# Patient Record
Sex: Male | Born: 1965 | Race: White | Hispanic: No | Smoking: Former smoker
Health system: Southern US, Community
[De-identification: ages and names within clinical notes are randomized; demographics above are authoritative.]

## PROBLEM LIST (undated history)

## (undated) DIAGNOSIS — G629 Polyneuropathy, unspecified: Secondary | ICD-10-CM

## (undated) DIAGNOSIS — E785 Hyperlipidemia, unspecified: Secondary | ICD-10-CM

## (undated) DIAGNOSIS — I872 Venous insufficiency (chronic) (peripheral): Secondary | ICD-10-CM

## (undated) DIAGNOSIS — E119 Type 2 diabetes mellitus without complications: Secondary | ICD-10-CM

## (undated) DIAGNOSIS — G4730 Sleep apnea, unspecified: Secondary | ICD-10-CM

## (undated) DIAGNOSIS — K219 Gastro-esophageal reflux disease without esophagitis: Secondary | ICD-10-CM

## (undated) DIAGNOSIS — I10 Essential (primary) hypertension: Secondary | ICD-10-CM

## (undated) HISTORY — PX: OTHER SURGICAL HISTORY: SHX169

## (undated) HISTORY — DX: Essential (primary) hypertension: I10

## (undated) HISTORY — DX: Hyperlipidemia, unspecified: E78.5

## (undated) HISTORY — DX: Type 2 diabetes mellitus without complications: E11.9

## (undated) HISTORY — PX: CARPAL TUNNEL RELEASE: SHX101

## (undated) HISTORY — PX: CATARACT EXTRACTION W/ INTRAOCULAR LENS IMPLANT: SHX1309

---

## 2004-11-07 ENCOUNTER — Ambulatory Visit: Payer: Self-pay | Admitting: Orthopedic Surgery

## 2017-04-17 LAB — BASIC METABOLIC PANEL
BUN: 15 (ref 4–21)
Creatinine: 1.2 (ref <=1.3)

## 2017-04-17 LAB — LIPID PANEL
Cholesterol: 131 (ref 0–200)
HDL: 23 — AB (ref 35–70)
TRIGLYCERIDES: 423 — AB (ref 40–160)

## 2017-04-17 LAB — HEMOGLOBIN A1C: HEMOGLOBIN A1C: 10.8

## 2017-06-03 ENCOUNTER — Ambulatory Visit (INDEPENDENT_AMBULATORY_CARE_PROVIDER_SITE_OTHER): Payer: Medicaid - Out of State | Admitting: "Endocrinology

## 2017-06-03 ENCOUNTER — Encounter: Payer: Self-pay | Admitting: "Endocrinology

## 2017-06-03 VITALS — BP 110/74 | HR 109 | Ht 66.0 in | Wt 184.0 lb

## 2017-06-03 DIAGNOSIS — Z794 Long term (current) use of insulin: Secondary | ICD-10-CM | POA: Diagnosis not present

## 2017-06-03 DIAGNOSIS — E1165 Type 2 diabetes mellitus with hyperglycemia: Secondary | ICD-10-CM | POA: Insufficient documentation

## 2017-06-03 DIAGNOSIS — I10 Essential (primary) hypertension: Secondary | ICD-10-CM | POA: Insufficient documentation

## 2017-06-03 DIAGNOSIS — E782 Mixed hyperlipidemia: Secondary | ICD-10-CM | POA: Insufficient documentation

## 2017-06-03 DIAGNOSIS — E1169 Type 2 diabetes mellitus with other specified complication: Secondary | ICD-10-CM | POA: Diagnosis not present

## 2017-06-03 DIAGNOSIS — IMO0002 Reserved for concepts with insufficient information to code with codable children: Secondary | ICD-10-CM | POA: Insufficient documentation

## 2017-06-03 MED ORDER — SITAGLIPTIN PHOSPHATE 50 MG PO TABS: 50.0000 mg | ORAL_TABLET | Freq: Every day | ORAL | 3 refills | Status: DC

## 2017-06-03 NOTE — Progress Notes (Signed)
Subjective:    Patient ID: Russell Clark, male    DOB: 1965/10/05.  he is being seen in consultation for management of currently uncontrolled symptomatic diabetes requested by  Barbie Haggis, FNP.   Past Medical History:  Diagnosis Date  . Diabetes mellitus, type II (HCC)   . Hyperlipidemia   . Hypertension    Past Surgical History:  Procedure Laterality Date  . OTHER SURGICAL HISTORY     L foot, Cataracts   Social History   Social History  . Marital status: Married    Spouse name: N/A  . Number of children: N/A  . Years of education: N/A   Social History Main Topics  . Smoking status: Former Smoker    Packs/day: 1.00    Years: 25.00    Types: Cigarettes  . Smokeless tobacco: Never Used  . Alcohol use No  . Drug use: No  . Sexual activity: Not Asked   Other Topics Concern  . None   Social History Narrative  . None   Outpatient Encounter Prescriptions as of 06/03/2017  Medication Sig  . buPROPion (WELLBUTRIN SR) 150 MG 12 hr tablet Take 150 mg by mouth 2 (two) times daily.  . cetirizine (ZYRTEC) 10 MG tablet Take 10 mg by mouth daily.  . Cholecalciferol (VITAMIN D PO) Take by mouth.  . gabapentin (NEURONTIN) 600 MG tablet Take 600 mg by mouth 2 (two) times daily.  . Insulin Glargine (LANTUS SOLOSTAR) 100 UNIT/ML Solostar Pen Inject 60 Units into the skin at bedtime.  Marland Kitchen lisinopril (PRINIVIL,ZESTRIL) 2.5 MG tablet Take 2.5 mg by mouth daily.  Marland Kitchen loperamide (IMODIUM A-D) 2 MG tablet Take 12 mg by mouth daily.  Marland Kitchen losartan-hydrochlorothiazide (HYZAAR) 100-12.5 MG tablet Take 1 tablet by mouth daily.  . metFORMIN (GLUCOPHAGE) 1000 MG tablet Take 1,000 mg by mouth 2 (two) times daily with a meal.  . nortriptyline (PAMELOR) 25 MG capsule Take 25 mg by mouth at bedtime.  Marland Kitchen omeprazole (PRILOSEC) 40 MG capsule Take 40 mg by mouth daily.  . pravastatin (PRAVACHOL) 80 MG tablet Take 80 mg by mouth daily.  . sitaGLIPtin (JANUVIA) 100 MG tablet Take 100 mg by mouth  daily.  Marland Kitchen zolpidem (AMBIEN CR) 12.5 MG CR tablet Take 12.5 mg by mouth at bedtime as needed for sleep.  . [DISCONTINUED] glimepiride (AMARYL) 4 MG tablet Take 4 mg by mouth daily with breakfast.  . [DISCONTINUED] insulin lispro protamine-lispro (HUMALOG 75/25 MIX) (75-25) 100 UNIT/ML SUSP injection Inject 2 Units into the skin 3 (three) times daily before meals.  . sitaGLIPtin (JANUVIA) 50 MG tablet Take 1 tablet (50 mg total) by mouth daily.   No facility-administered encounter medications on file as of 06/03/2017.     ALLERGIES: Allergies  Allergen Reactions  . Aspirin   . Darvon [Propoxyphene]   . Ibuprofen   . Penicillins     VACCINATION STATUS:  There is no immunization history on file for this patient.  Diabetes  He presents for his initial diabetic visit. He has type 2 diabetes mellitus. Onset time: He was diagnosed at approximate age of 41 years. His disease course has been worsening. There are no hypoglycemic associated symptoms. Pertinent negatives for hypoglycemia include no confusion, headaches, pallor or seizures. Associated symptoms include blurred vision, polydipsia and polyuria. Pertinent negatives for diabetes include no chest pain, no fatigue, no polyphagia and no weakness. There are no hypoglycemic complications. Symptoms are worsening. Diabetic complications include peripheral neuropathy. Risk factors for coronary artery disease  include dyslipidemia, diabetes mellitus, hypertension, male sex, sedentary lifestyle and tobacco exposure. Current diabetic treatment includes insulin injections and oral agent (dual therapy). Insulin injections are given by patient. His weight is stable. He is following a generally unhealthy diet. When asked about meal planning, he reported none. He never participates in exercise. His overall blood glucose range is >200 mg/dl. An ACE inhibitor/angiotensin II receptor blocker is being taken. He sees a podiatrist.Eye exam is current.  Hyperlipidemia   This is a chronic problem. The current episode started more than 1 year ago. The problem is uncontrolled. Recent lipid tests were reviewed and are high. Exacerbating diseases include diabetes and obesity. Factors aggravating his hyperlipidemia include smoking. Pertinent negatives include no chest pain, myalgias or shortness of breath. Current antihyperlipidemic treatment includes statins. Risk factors for coronary artery disease include dyslipidemia, diabetes mellitus and a sedentary lifestyle.  Hypertension  This is a chronic problem. The current episode started more than 1 year ago. Associated symptoms include blurred vision. Pertinent negatives include no chest pain, headaches, neck pain, palpitations or shortness of breath. Risk factors for coronary artery disease include dyslipidemia, diabetes mellitus, male gender, sedentary lifestyle and smoking/tobacco exposure. Past treatments include angiotensin blockers.     Review of Systems  Constitutional: Negative for chills, fatigue, fever and unexpected weight change.  HENT: Negative for dental problem, mouth sores and trouble swallowing.   Eyes: Positive for blurred vision. Negative for visual disturbance.  Respiratory: Negative for cough, choking, chest tightness, shortness of breath and wheezing.   Cardiovascular: Negative for chest pain, palpitations and leg swelling.  Gastrointestinal: Negative for abdominal distention, abdominal pain, constipation, diarrhea, nausea and vomiting.  Endocrine: Positive for polydipsia and polyuria. Negative for polyphagia.  Genitourinary: Negative for dysuria, flank pain, hematuria and urgency.  Musculoskeletal: Positive for gait problem. Negative for back pain, myalgias and neck pain.  Skin: Negative for pallor, rash and wound.  Neurological: Negative for seizures, syncope, weakness, numbness and headaches.  Psychiatric/Behavioral: Negative.  Negative for confusion and dysphoric mood.    Objective:    BP  110/74   Pulse (!) 109   Ht  (1.676 m)   Wt 184 lb (83.5 kg)   BMI 29.70 kg/m   Wt Readings from Last 3 Encounters:  06/03/17 184 lb (83.5 kg)     Physical Exam  Constitutional: He is oriented to person, place, and time. He appears well-developed and well-nourished. He is cooperative. No distress.  HENT:  Head: Normocephalic and atraumatic.  Eyes: EOM are normal.  Neck: Normal range of motion. Neck supple. No tracheal deviation present. No thyromegaly present.  Cardiovascular: Normal rate, S1 normal, S2 normal and normal heart sounds.  Exam reveals no gallop.   No murmur heard. Pulses:      Dorsalis pedis pulses are 1+ on the right side, and 1+ on the left side.       Posterior tibial pulses are 1+ on the right side, and 1+ on the left side.  Pulmonary/Chest: Breath sounds normal. No respiratory distress. He has no wheezes.  Abdominal: Soft. Bowel sounds are normal. He exhibits no distension. There is no tenderness. There is no guarding and no CVA tenderness.  Musculoskeletal: He exhibits no edema.       Right shoulder: He exhibits no swelling and no deformity.  Neurological: He is alert and oriented to person, place, and time. He has normal strength and normal reflexes. No cranial nerve deficit or sensory deficit. Gait normal.  Skin: Skin is warm  and dry. No rash noted. No cyanosis. Nails show no clubbing.  Psychiatric: He has a normal mood and affect. His speech is normal. Cognition and memory are normal.   Recent Results (from the past 2160 hour(s))  Basic metabolic panel     Status: None   Collection Time: 04/17/17 12:00 AM  Result Value Ref Range   BUN 15 4 - 21   Creatinine 1.2 0.6 - 1.3  Lipid panel     Status: Abnormal   Collection Time: 04/17/17 12:00 AM  Result Value Ref Range   Triglycerides 423 (A) 40 - 160   Cholesterol 131 0 - 200   HDL 23 (A) 35 - 70  Hemoglobin A1c     Status: None   Collection Time: 04/17/17 12:00 AM  Result Value Ref Range    Hemoglobin A1C 10.8     Assessment & Plan:   1. Uncontrolled type 2 diabetes mellitus with other specified complication, with Panagopoulos-term current use of insulin (HCC)  - Patient has currently uncontrolled symptomatic type 2 DM since  51 years of age,  with most recent A1c of 10.8 %. Recent labs reviewed.  -his diabetes is complicated by peripheral neuropathy, sedentary life and Antion E Wanamaker remains at a high risk for more acute and chronic complications which include CAD, CVA, CKD, retinopathy, and neuropathy. These are all discussed in detail with the patient.  - I have counseled him on diet management and weight loss, by adopting a carbohydrate restricted/protein rich diet.  - Suggestion is made for him to avoid simple carbohydrates  from his diet including Cakes, Sweet Desserts, Ice Cream, Soda (diet and regular), Sweet Tea, Candies, Chips, Cookies, Store Bought Juices, Alcohol in Excess of  1-2 drinks a day, Artificial Sweeteners, and "Sugar-free" Products. This will help patient to have stable blood glucose profile and potentially avoid unintended weight gain.  - I encouraged him to switch to  unprocessed or minimally processed complex starch and increased protein intake (animal or plant source), fruits, and vegetables.  - he is advised to stick to a routine mealtimes to eat 3 meals  a day and avoid unnecessary snacks ( to snack only to correct hypoglycemia).   - he will be scheduled with Norm Salt, RDN, CDE for individualized DM education.  - I have approached him with the following individualized plan to manage diabetes and patient agrees:   -  He may require basal/bolus insulin to treat diabetes target. -However, it is essential to assure his commitment for proper monitoring of blood glucose for safe use of insulin.  I  will proceed to initiate strict monitoring of glucose 4 times a day-before meals and at bedtime, and return in one week with his meter and logs for  reevaluation. - In the meantime, I have advised him to continue Lantus 60 units at bedtime. - Patient is warned not to take insulin without proper monitoring per orders.  -Patient is encouraged to call clinic for blood glucose levels less than 70 or above 300 mg /dl. - I will continue metformin 1000 g by mouth twice a day, Januvia 50 mg by mouth daily, therapeutically suitable for patient . - I have advised him to discontinue Humalog 75/25. - If he is returning with significantly above target blood glucose profile, he'll be considered for rapid acting insulin. - I will discontinue glimepiride, risk outweighs benefit for this patient.  - Patient specific target  A1c;  LDL, HDL, Triglycerides, and  Waist Circumference were discussed  in detail.  2) BP/HTN: Controlled. Continue current medications including ACEI/ARB. 3) Lipids/HPL:   Uncontrolled with LDL of 423.   Patient is advised to continue statins and she may benefit from Lovaza, would be considered next visit. 4)  Weight/Diet: CDE Consult will be initiated , exercise, and detailed carbohydrates information provided.  5) Chronic Care/Health Maintenance:  -he  is on ACEI/ARB and Statin medications and  is encouraged to continue to follow up with Ophthalmology, Dentist,  Podiatrist at least yearly or according to recommendations, and advised to  stay away from smoking. I have recommended yearly flu vaccine and pneumonia vaccination at least every 5 years; moderate intensity exercise for up to 150 minutes weekly; and  sleep for at least 7 hours a day.  - Time spent with the patient: 1 hour, of which >50% was spent in obtaining information about his symptoms, reviewing his previous labs, evaluations, and treatments, counseling him about his  currently uncontrolled, compensated type 2 diabetes, hyperlipidemia, hypertension, and developing a plan for Cer[place]o term treatment.  - Patient to bring meter and  blood glucose logs during his next visit.  -  I advised patient to maintain close follow up with Barbie Haggis, FNP for primary care needs.  Follow up plan: - Return in about 1 week (around 06/10/2017) for follow up with meter and logs- no labs.  Marquis Lunch, MD Phone: [phone]  Fax: [phone]   06/03/2017, 6:07 PM This note was partially dictated with voice recognition software. Similar sounding words can be transcribed inadequately or may not  be corrected upon review.

## 2017-06-03 NOTE — Patient Instructions (Signed)

## 2017-06-11 ENCOUNTER — Encounter: Payer: Self-pay | Admitting: "Endocrinology

## 2017-06-11 ENCOUNTER — Ambulatory Visit (INDEPENDENT_AMBULATORY_CARE_PROVIDER_SITE_OTHER): Payer: Medicaid - Out of State | Admitting: "Endocrinology

## 2017-06-11 VITALS — BP 122/79 | HR 96 | Ht 66.0 in | Wt 185.0 lb

## 2017-06-11 DIAGNOSIS — E782 Mixed hyperlipidemia: Secondary | ICD-10-CM

## 2017-06-11 DIAGNOSIS — E1165 Type 2 diabetes mellitus with hyperglycemia: Secondary | ICD-10-CM

## 2017-06-11 DIAGNOSIS — I10 Essential (primary) hypertension: Secondary | ICD-10-CM

## 2017-06-11 DIAGNOSIS — Z794 Long term (current) use of insulin: Secondary | ICD-10-CM | POA: Diagnosis not present

## 2017-06-11 DIAGNOSIS — E1169 Type 2 diabetes mellitus with other specified complication: Secondary | ICD-10-CM | POA: Diagnosis not present

## 2017-06-11 DIAGNOSIS — IMO0002 Reserved for concepts with insufficient information to code with codable children: Secondary | ICD-10-CM

## 2017-06-11 MED ORDER — INSULIN LISPRO 100 UNIT/ML (KWIKPEN): 10.0000 [IU] | PEN_INJECTOR | Freq: Three times a day (TID) | SUBCUTANEOUS | 2 refills | Status: DC

## 2017-06-11 NOTE — Patient Instructions (Signed)

## 2017-06-11 NOTE — Progress Notes (Signed)
Subjective:    Patient ID: Russell Clark, male    DOB: June 26, 1966.  he is being seen in f/u  for management of currently uncontrolled symptomatic diabetes requested by  Russell Haggis, FNP.   Past Medical History:  Diagnosis Date  . Diabetes mellitus, type II (HCC)   . Hyperlipidemia   . Hypertension    Past Surgical History:  Procedure Laterality Date  . OTHER SURGICAL HISTORY     L foot, Cataracts   Social History   Social History  . Marital status: Married    Spouse name: N/A  . Number of children: N/A  . Years of education: N/A   Social History Main Topics  . Smoking status: Former Smoker    Packs/day: 1.00    Years: 25.00    Types: Cigarettes  . Smokeless tobacco: Never Used  . Alcohol use No  . Drug use: No  . Sexual activity: Not Asked   Other Topics Concern  . None   Social History Narrative  . None   Outpatient Encounter Prescriptions as of 06/11/2017  Medication Sig  . buPROPion (WELLBUTRIN SR) 150 MG 12 hr tablet Take 150 mg by mouth 2 (two) times daily.  . cetirizine (ZYRTEC) 10 MG tablet Take 10 mg by mouth daily.  . Cholecalciferol (VITAMIN D PO) Take by mouth.  . gabapentin (NEURONTIN) 600 MG tablet Take 600 mg by mouth 2 (two) times daily.  . Insulin Glargine (LANTUS SOLOSTAR) 100 UNIT/ML Solostar Pen Inject 70 Units into the skin at bedtime.  . insulin lispro (HUMALOG KWIKPEN) 100 UNIT/ML KiwkPen Inject 0.1-0.16 mLs (10-16 Units total) into the skin 3 (three) times daily.  Marland Kitchen lisinopril (PRINIVIL,ZESTRIL) 2.5 MG tablet Take 2.5 mg by mouth daily.  Marland Kitchen loperamide (IMODIUM A-D) 2 MG tablet Take 12 mg by mouth daily.  Marland Kitchen losartan-hydrochlorothiazide (HYZAAR) 100-12.5 MG tablet Take 1 tablet by mouth daily.  . metFORMIN (GLUCOPHAGE) 1000 MG tablet Take 1,000 mg by mouth 2 (two) times daily with a meal.  . nortriptyline (PAMELOR) 25 MG capsule Take 25 mg by mouth at bedtime.  Marland Kitchen omeprazole (PRILOSEC) 40 MG capsule Take 40 mg by mouth daily.  .  pravastatin (PRAVACHOL) 80 MG tablet Take 80 mg by mouth daily.  . [DISCONTINUED] sitaGLIPtin (JANUVIA) 100 MG tablet Take 100 mg by mouth daily.  . [DISCONTINUED] sitaGLIPtin (JANUVIA) 50 MG tablet Take 1 tablet (50 mg total) by mouth daily.  . [DISCONTINUED] zolpidem (AMBIEN CR) 12.5 MG CR tablet Take 12.5 mg by mouth at bedtime as needed for sleep.   No facility-administered encounter medications on file as of 06/11/2017.     ALLERGIES: Allergies  Allergen Reactions  . Aspirin   . Darvon [Propoxyphene]   . Ibuprofen   . Penicillins     VACCINATION STATUS:  There is no immunization history on file for this patient.  Diabetes  He presents for his follow-up diabetic visit. He has type 2 diabetes mellitus. Onset time: He was diagnosed at approximate age of 41 years. His disease course has been worsening. There are no hypoglycemic associated symptoms. Pertinent negatives for hypoglycemia include no confusion, headaches, pallor or seizures. Associated symptoms include blurred vision, polydipsia and polyuria. Pertinent negatives for diabetes include no chest pain, no fatigue, no polyphagia and no weakness. There are no hypoglycemic complications. Symptoms are worsening. Diabetic complications include peripheral neuropathy. Risk factors for coronary artery disease include dyslipidemia, diabetes mellitus, hypertension, male sex, sedentary lifestyle and tobacco exposure. Current diabetic treatment includes  insulin injections and oral agent (dual therapy). Insulin injections are given by patient. His weight is stable. He is following a generally unhealthy diet. When asked about meal planning, he reported none. He never participates in exercise. His home blood glucose trend is increasing steadily. His breakfast blood glucose range is generally >200 mg/dl. His lunch blood glucose range is generally >200 mg/dl. His dinner blood glucose range is generally >200 mg/dl. His bedtime blood glucose range is  generally >200 mg/dl. His overall blood glucose range is >200 mg/dl. An ACE inhibitor/angiotensin II receptor blocker is being taken. He sees a podiatrist.Eye exam is current.  Hyperlipidemia  This is a chronic problem. The current episode started more than 1 year ago. The problem is uncontrolled. Recent lipid tests were reviewed and are high. Exacerbating diseases include diabetes and obesity. Factors aggravating his hyperlipidemia include smoking. Pertinent negatives include no chest pain, myalgias or shortness of breath. Current antihyperlipidemic treatment includes statins. Risk factors for coronary artery disease include dyslipidemia, diabetes mellitus and a sedentary lifestyle.  Hypertension  This is a chronic problem. The current episode started more than 1 year ago. Associated symptoms include blurred vision. Pertinent negatives include no chest pain, headaches, neck pain, palpitations or shortness of breath. Risk factors for coronary artery disease include dyslipidemia, diabetes mellitus, male gender, sedentary lifestyle and smoking/tobacco exposure. Past treatments include angiotensin blockers.    Review of Systems  Constitutional: Negative for chills, fatigue, fever and unexpected weight change.  HENT: Negative for dental problem, mouth sores and trouble swallowing.   Eyes: Positive for blurred vision. Negative for visual disturbance.  Respiratory: Negative for cough, choking, chest tightness, shortness of breath and wheezing.   Cardiovascular: Negative for chest pain, palpitations and leg swelling.  Gastrointestinal: Negative for abdominal distention, abdominal pain, constipation, diarrhea, nausea and vomiting.  Endocrine: Positive for polydipsia and polyuria. Negative for polyphagia.  Genitourinary: Negative for dysuria, flank pain, hematuria and urgency.  Musculoskeletal: Positive for gait problem. Negative for back pain, myalgias and neck pain.  Skin: Negative for pallor, rash and  wound.  Neurological: Negative for seizures, syncope, weakness, numbness and headaches.  Psychiatric/Behavioral: Negative.  Negative for confusion and dysphoric mood.    Objective:    BP 122/79   Pulse 96   Ht  (1.676 m)   Wt 185 lb (83.9 kg)   BMI 29.86 kg/m   Wt Readings from Last 3 Encounters:  06/11/17 185 lb (83.9 kg)  06/03/17 184 lb (83.5 kg)     Physical Exam  Constitutional: He is oriented to person, place, and time. He appears well-developed and well-nourished. He is cooperative. No distress.  HENT:  Head: Normocephalic and atraumatic.  Eyes: EOM are normal.  Neck: Normal range of motion. Neck supple. No tracheal deviation present. No thyromegaly present.  Cardiovascular: Normal rate, S1 normal, S2 normal and normal heart sounds.  Exam reveals no gallop.   No murmur heard. Pulses:      Dorsalis pedis pulses are 1+ on the right side, and 1+ on the left side.       Posterior tibial pulses are 1+ on the right side, and 1+ on the left side.  Pulmonary/Chest: Breath sounds normal. No respiratory distress. He has no wheezes.  Abdominal: Soft. Bowel sounds are normal. He exhibits no distension. There is no tenderness. There is no guarding and no CVA tenderness.  Musculoskeletal: He exhibits no edema.       Right shoulder: He exhibits no swelling and no deformity.  Neurological: He is alert and oriented to person, place, and time. He has normal strength and normal reflexes. No cranial nerve deficit or sensory deficit. Gait normal.  Skin: Skin is warm and dry. No rash noted. No cyanosis. Nails show no clubbing.  Psychiatric: He has a normal mood and affect. His speech is normal. Cognition and memory are normal.   Recent Results (from the past 2160 hour(s))  Basic metabolic panel     Status: None   Collection Time: 04/17/17 12:00 AM  Result Value Ref Range   BUN 15 4 - 21   Creatinine 1.2 0.6 - 1.3  Lipid panel     Status: Abnormal   Collection Time: 04/17/17 12:00 AM   Result Value Ref Range   Triglycerides 423 (A) 40 - 160   Cholesterol 131 0 - 200   HDL 23 (A) 35 - 70  Hemoglobin A1c     Status: None   Collection Time: 04/17/17 12:00 AM  Result Value Ref Range   Hemoglobin A1C 10.8     Assessment & Plan:   1. Uncontrolled type 2 diabetes mellitus with other specified complication, with Terrance-term current use of insulin (HCC)  - Patient has currently uncontrolled symptomatic type 2 DM since  51 years of age,  with most recent A1c of 10.8 %. Recent labs reviewed. - He came with significantly above target blood glucose profile averaging 281 for the last 14 days. -his diabetes is complicated by peripheral neuropathy, sedentary life and Jaimin E Giammona remains at a high risk for more acute and chronic complications which include CAD, CVA, CKD, retinopathy, and neuropathy. These are all discussed in detail with the patient.  - I have counseled him on diet management and weight loss, by adopting a carbohydrate restricted/protein rich diet.  - Suggestion is made for him to avoid simple carbohydrates  from his diet including Cakes, Sweet Desserts, Ice Cream, Soda (diet and regular), Sweet Tea, Candies, Chips, Cookies, Store Bought Juices, Alcohol in Excess of  1-2 drinks a day, Artificial Sweeteners, and "Sugar-free" Products. This will help patient to have stable blood glucose profile and potentially avoid unintended weight gain.  - I encouraged him to switch to  unprocessed or minimally processed complex starch and increased protein intake (animal or plant source), fruits, and vegetables.  - he is advised to stick to a routine mealtimes to eat 3 meals  a day and avoid unnecessary snacks ( to snack only to correct hypoglycemia).   - he will be scheduled with Norm Salt, RDN, CDE for individualized DM education- consult pending.  - I have approached him with the following individualized plan to manage diabetes and patient agrees:   -  He will require  basal/bolus insulin to treat diabetes target. - I have advised him to increase Lantus to 70 units daily at bedtime, initiate Humalog 10 units 3 times a day before meals for pre-meal blood glucose above 90 mg/dL plus patient specific correction dose for pre-meal blood glucose above 150 mg/dL, associated with strict monitoring of blood glucose 4 times a day-before meals and at bedtime.  - Patient is warned not to take insulin without proper monitoring per orders. -Patient is encouraged to call clinic for blood glucose levels less than 70 or above 300 mg /dl. - I will continue metformin 1000 g by mouth twice a day. - He would discontinue Januvia after his current supply used.  - Patient specific target  A1c;  LDL, HDL, Triglycerides, and  Waist  Circumference were discussed in detail.  2) BP/HTN: Controlled. Continue current medications including ACEI/ARB. 3) Lipids/HPL:   Uncontrolled with triglycerides of 423. Better control of diabetes will help control triglycerides.   Patient is advised to continue statins and she may benefit from Lovaza, would be considered next visit.  4)  Weight/Diet: CDE Consult has been  initiated , exercise, and detailed carbohydrates information provided.  5) Chronic Care/Health Maintenance:  -he  is on ACEI/ARB and Statin medications and  is encouraged to continue to follow up with Ophthalmology, Dentist,  Podiatrist at least yearly or according to recommendations, and advised to  stay away from smoking. I have recommended yearly flu vaccine and pneumonia vaccination at least every 5 years; moderate intensity exercise for up to 150 minutes weekly; and  sleep for at least 7 hours a day.  - Time spent with the patient: 25 min, of which >50% was spent in reviewing his sugar logs , discussing his hypo- and hyper-glycemic episodes, reviewing his current and  previous labs and insulin doses and developing a plan to avoid hypo- and hyper-glycemia.   - Patient to bring meter and   blood glucose logs during his next visit.  - I advised patient to maintain close follow up with Russell Haggis, FNP for primary care needs.  Follow up plan: - Return in about 2 weeks (around 06/25/2017) for follow up with meter and logs- no labs.  Marquis Lunch, MD Phone: [phone]  Fax: [phone]   06/11/2017, 2:51 PM This note was partially dictated with voice recognition software. Similar sounding words can be transcribed inadequately or may not  be corrected upon review.

## 2017-06-12 ENCOUNTER — Other Ambulatory Visit: Payer: Self-pay

## 2017-06-12 MED ORDER — INSULIN ASPART 100 UNIT/ML FLEXPEN: 10.0000 [IU] | PEN_INJECTOR | Freq: Three times a day (TID) | SUBCUTANEOUS | 2 refills | Status: DC

## 2017-06-25 ENCOUNTER — Encounter: Payer: Self-pay | Admitting: "Endocrinology

## 2017-06-25 ENCOUNTER — Ambulatory Visit (INDEPENDENT_AMBULATORY_CARE_PROVIDER_SITE_OTHER): Payer: Medicaid - Out of State | Admitting: "Endocrinology

## 2017-06-25 VITALS — BP 117/76 | HR 106 | Ht 66.0 in | Wt 189.0 lb

## 2017-06-25 DIAGNOSIS — I10 Essential (primary) hypertension: Secondary | ICD-10-CM

## 2017-06-25 DIAGNOSIS — E782 Mixed hyperlipidemia: Secondary | ICD-10-CM

## 2017-06-25 DIAGNOSIS — E1165 Type 2 diabetes mellitus with hyperglycemia: Secondary | ICD-10-CM | POA: Diagnosis not present

## 2017-06-25 MED ORDER — INSULIN ASPART 100 UNIT/ML FLEXPEN: 12.0000 [IU] | PEN_INJECTOR | Freq: Three times a day (TID) | SUBCUTANEOUS | 2 refills | Status: DC

## 2017-06-25 NOTE — Progress Notes (Signed)
Subjective:    Patient ID: Russell Clark, male    DOB: [birth year].  he is being seen in f/u  for management of currently uncontrolled symptomatic diabetes requested by  Barbie Haggis, FNP.   Past Medical History:  Diagnosis Date  . Diabetes mellitus, type II (HCC)   . Hyperlipidemia   . Hypertension    Past Surgical History:  Procedure Laterality Date  . OTHER SURGICAL HISTORY     L foot, Cataracts   Social History   Social History  . Marital status: Married    Spouse name: N/A  . Number of children: N/A  . Years of education: N/A   Social History Main Topics  . Smoking status: Former Smoker    Packs/day: 1.00    Years: 25.00    Types: Cigarettes  . Smokeless tobacco: Never Used  . Alcohol use No  . Drug use: No  . Sexual activity: Not Asked   Other Topics Concern  . None   Social History Narrative  . None   Outpatient Encounter Prescriptions as of 06/25/2017  Medication Sig  . buPROPion (WELLBUTRIN SR) 150 MG 12 hr tablet Take 150 mg by mouth 2 (two) times daily.  . cetirizine (ZYRTEC) 10 MG tablet Take 10 mg by mouth daily.  . Cholecalciferol (VITAMIN D PO) Take by mouth.  . gabapentin (NEURONTIN) 600 MG tablet Take 600 mg by mouth 2 (two) times daily.  . insulin aspart (NOVOLOG FLEXPEN) 100 UNIT/ML FlexPen Inject 12-18 Units into the skin 3 (three) times daily with meals.  . Insulin Glargine (LANTUS SOLOSTAR) 100 UNIT/ML Solostar Pen Inject 80 Units into the skin at bedtime.  Marland Kitchen lisinopril (PRINIVIL,ZESTRIL) 2.5 MG tablet Take 2.5 mg by mouth daily.  Marland Kitchen loperamide (IMODIUM A-D) 2 MG tablet Take 12 mg by mouth daily.  Marland Kitchen losartan-hydrochlorothiazide (HYZAAR) 100-12.5 MG tablet Take 1 tablet by mouth daily.  . metFORMIN (GLUCOPHAGE) 1000 MG tablet Take 1,000 mg by mouth 2 (two) times daily with a meal.  . nortriptyline (PAMELOR) 25 MG capsule Take 25 mg by mouth at bedtime.  Marland Kitchen omeprazole (PRILOSEC) 40 MG capsule Take 40 mg by mouth daily.  . pravastatin  (PRAVACHOL) 80 MG tablet Take 80 mg by mouth daily.  . [DISCONTINUED] insulin aspart (NOVOLOG FLEXPEN) 100 UNIT/ML FlexPen Inject 10-16 Units into the skin 3 (three) times daily with meals.  . [DISCONTINUED] insulin lispro (HUMALOG KWIKPEN) 100 UNIT/ML KiwkPen Inject 0.1-0.16 mLs (10-16 Units total) into the skin 3 (three) times daily.   No facility-administered encounter medications on file as of 06/25/2017.     ALLERGIES: Allergies  Allergen Reactions  . Aspirin   . Darvon [Propoxyphene]   . Ibuprofen   . Penicillins     VACCINATION STATUS:  There is no immunization history on file for this patient.  Diabetes  He presents for his follow-up diabetic visit. He has type 2 diabetes mellitus. Onset time: He was diagnosed at approximate age of 41 years. His disease course has been improving. There are no hypoglycemic associated symptoms. Pertinent negatives for hypoglycemia include no confusion, headaches, pallor or seizures. Associated symptoms include blurred vision, polydipsia and polyuria. Pertinent negatives for diabetes include no chest pain, no fatigue, no polyphagia and no weakness. There are no hypoglycemic complications. Symptoms are worsening. Diabetic complications include peripheral neuropathy. Risk factors for coronary artery disease include dyslipidemia, diabetes mellitus, hypertension, male sex, sedentary lifestyle and tobacco exposure. Current diabetic treatment includes insulin injections and oral agent (dual therapy). Insulin  injections are given by patient. His weight is stable. He is following a generally unhealthy diet. When asked about meal planning, he reported none. He never participates in exercise. His home blood glucose trend is increasing steadily. His breakfast blood glucose range is generally >200 mg/dl. His lunch blood glucose range is generally >200 mg/dl. His dinner blood glucose range is generally >200 mg/dl. His bedtime blood glucose range is generally >200 mg/dl.  His overall blood glucose range is >200 mg/dl. An ACE inhibitor/angiotensin II receptor blocker is being taken. He sees a podiatrist.Eye exam is current.  Hyperlipidemia  This is a chronic problem. The current episode started more than 1 year ago. The problem is uncontrolled. Recent lipid tests were reviewed and are high. Exacerbating diseases include diabetes and obesity. Factors aggravating his hyperlipidemia include smoking. Pertinent negatives include no chest pain, myalgias or shortness of breath. Current antihyperlipidemic treatment includes statins. Risk factors for coronary artery disease include dyslipidemia, diabetes mellitus and a sedentary lifestyle.  Hypertension  This is a chronic problem. The current episode started more than 1 year ago. Associated symptoms include blurred vision. Pertinent negatives include no chest pain, headaches, neck pain, palpitations or shortness of breath. Risk factors for coronary artery disease include dyslipidemia, diabetes mellitus, male gender, sedentary lifestyle and smoking/tobacco exposure. Past treatments include angiotensin blockers.    Review of Systems  Constitutional: Negative for chills, fatigue, fever and unexpected weight change.  HENT: Negative for dental problem, mouth sores and trouble swallowing.   Eyes: Positive for blurred vision. Negative for visual disturbance.  Respiratory: Negative for cough, choking, chest tightness, shortness of breath and wheezing.   Cardiovascular: Negative for chest pain, palpitations and leg swelling.  Gastrointestinal: Negative for abdominal distention, abdominal pain, constipation, diarrhea, nausea and vomiting.  Endocrine: Positive for polydipsia and polyuria. Negative for polyphagia.  Genitourinary: Negative for dysuria, flank pain, hematuria and urgency.  Musculoskeletal: Positive for gait problem. Negative for back pain, myalgias and neck pain.  Skin: Negative for pallor, rash and wound.  Neurological:  Negative for seizures, syncope, weakness, numbness and headaches.  Psychiatric/Behavioral: Negative.  Negative for confusion and dysphoric mood.    Objective:    BP 117/76   Pulse (!) 106   Ht  (1.676 m)   Wt 189 lb (85.7 kg)   BMI 30.51 kg/m   Wt Readings from Last 3 Encounters:  06/25/17 189 lb (85.7 kg)  06/11/17 185 lb (83.9 kg)  06/03/17 184 lb (83.5 kg)     Physical Exam  Constitutional: He is oriented to person, place, and time. He appears well-developed and well-nourished. He is cooperative. No distress.  HENT:  Head: Normocephalic and atraumatic.  Eyes: EOM are normal.  Neck: Normal range of motion. Neck supple. No tracheal deviation present. No thyromegaly present.  Cardiovascular: Normal rate, S1 normal, S2 normal and normal heart sounds.  Exam reveals no gallop.   No murmur heard. Pulses:      Dorsalis pedis pulses are 1+ on the right side, and 1+ on the left side.       Posterior tibial pulses are 1+ on the right side, and 1+ on the left side.  Pulmonary/Chest: Breath sounds normal. No respiratory distress. He has no wheezes.  Abdominal: Soft. Bowel sounds are normal. He exhibits no distension. There is no tenderness. There is no guarding and no CVA tenderness.  Musculoskeletal: He exhibits no edema.       Right shoulder: He exhibits no swelling and no deformity.  Neurological:  He is alert and oriented to person, place, and time. He has normal strength and normal reflexes. No cranial nerve deficit or sensory deficit. Gait normal.  Skin: Skin is warm and dry. No rash noted. No cyanosis. Nails show no clubbing.  Psychiatric: He has a normal mood and affect. His speech is normal. Cognition and memory are normal.    Recent Results (from the past 2160 hour(s))  Basic metabolic panel     Status: None   Collection Time: 04/17/17 12:00 AM  Result Value Ref Range   BUN 15 4 - 21   Creatinine 1.2 0.6 - 1.3  Lipid panel     Status: Abnormal   Collection Time:  04/17/17 12:00 AM  Result Value Ref Range   Triglycerides 423 (A) 40 - 160   Cholesterol 131 0 - 200   HDL 23 (A) 35 - 70  Hemoglobin A1c     Status: None   Collection Time: 04/17/17 12:00 AM  Result Value Ref Range   Hemoglobin A1C 10.8     Assessment & Plan:   1. Uncontrolled type 2 diabetes mellitus with other specified complication, with Fellner-term current use of insulin (HCC)  - Patient has currently uncontrolled symptomatic type 2 DM since  51 years of age,  with most recent A1c of 10.8 %. Recent labs reviewed. - He came with significantly above target blood glucose profile , Despite her basal/bolus insulin.   -his diabetes is complicated by peripheral neuropathy, sedentary life and Arick E Hawkes remains at a high risk for more acute and chronic complications which include CAD, CVA, CKD, retinopathy, and neuropathy. These are all discussed in detail with the patient.  - I have counseled him on diet management and weight loss, by adopting a carbohydrate restricted/protein rich diet.  -  Suggestion is made for him to avoid simple carbohydrates  from his diet including Cakes, Sweet Desserts / Pastries, Ice Cream, Soda (diet and regular), Sweet Tea, Candies, Chips, Cookies, Store Bought Juices, Alcohol in Excess of  1-2 drinks a day, Artificial Sweeteners, and "Sugar-free" Products. This will help patient to have stable blood glucose profile and potentially avoid unintended weight gain.   - I encouraged him to switch to  unprocessed or minimally processed complex starch and increased protein intake (animal or plant source), fruits, and vegetables.  - he is advised to stick to a routine mealtimes to eat 3 meals  a day and avoid unnecessary snacks ( to snack only to correct hypoglycemia).    - I have approached him with the following individualized plan to manage diabetes and patient agrees:   -  He will  continue to require basal/bolus insulin to treat diabetes target. - I have  advised him to increase Lantus to 80 units daily at bedtime, initiate increase his Humalog to 12 units 3 times a day before meals for pre-meal blood glucose above 90 mg/dL plus patient specific correction dose for pre-meal blood glucose above 150 mg/dL, associated with strict monitoring of blood glucose 4 times a day-before meals and at bedtime.  - Patient is warned not to take insulin without proper monitoring per orders. -Patient is encouraged to call clinic for blood glucose levels less than 70 or above 300 mg /dl. - I will continue metformin 1000 mg by mouth twice a day. - He would discontinue Januvia after his current supply used.  - Patient specific target  A1c;  LDL, HDL, Triglycerides, and  Waist Circumference were discussed in detail.  2) BP/HTN: Controlled. Continue current medications including ACEI/ARB. 3) Lipids/HPL:   Uncontrolled with triglycerides of 423. Better control of diabetes will help control triglycerides.   Patient is advised to continue statins and she may benefit from Lovaza, would be considered next visit.  4)  Weight/Diet: CDE Consult has been  initiated , exercise, and detailed carbohydrates information provided.  5) Chronic Care/Health Maintenance:  -he  is on ACEI/ARB and Statin medications and  is encouraged to continue to follow up with Ophthalmology, Dentist,  Podiatrist at least yearly or according to recommendations, and advised to  stay away from smoking. I have recommended yearly flu vaccine and pneumonia vaccination at least every 5 years; moderate intensity exercise for up to 150 minutes weekly; and  sleep for at least 7 hours a day.  - Time spent with the patient: 25 min, of which >50% was spent in reviewing his sugar logs , discussing his hypo- and hyper-glycemic episodes, reviewing his current and  previous labs and insulin doses and developing a plan to avoid hypo- and hyper-glycemia.   - I advised patient to maintain close follow up with Barbie Haggis, FNP for primary care needs.  Follow up plan: - Return in about 5 weeks (around 07/30/2017) for meter, and logs.  Marquis Lunch, MD Phone: [phone]  Fax: [phone]   06/25/2017, 12:10 PM   This note was partially dictated with voice recognition software. Similar sounding words can be transcribed inadequately or may not  be corrected upon review.

## 2017-06-25 NOTE — Patient Instructions (Signed)

## 2017-07-13 ENCOUNTER — Encounter: Payer: Medicaid - Out of State | Attending: Nurse Practitioner | Admitting: Nutrition

## 2017-07-13 VITALS — Ht 68.0 in | Wt 190.0 lb

## 2017-07-13 DIAGNOSIS — E1169 Type 2 diabetes mellitus with other specified complication: Secondary | ICD-10-CM | POA: Insufficient documentation

## 2017-07-13 DIAGNOSIS — E1165 Type 2 diabetes mellitus with hyperglycemia: Secondary | ICD-10-CM | POA: Insufficient documentation

## 2017-07-13 DIAGNOSIS — E118 Type 2 diabetes mellitus with unspecified complications: Secondary | ICD-10-CM

## 2017-07-13 DIAGNOSIS — Z794 Long term (current) use of insulin: Secondary | ICD-10-CM | POA: Diagnosis not present

## 2017-07-13 DIAGNOSIS — Z713 Dietary counseling and surveillance: Secondary | ICD-10-CM | POA: Insufficient documentation

## 2017-07-13 DIAGNOSIS — IMO0002 Reserved for concepts with insufficient information to code with codable children: Secondary | ICD-10-CM

## 2017-07-13 NOTE — Patient Instructions (Signed)
Goals 1. Follow MY Plate 2. Eat three balanced meals 3 carb choices per meal 3. Increaser fresh fruits and vegetables. 4. Keep drinking water 5. Test blood sugar before each meal and at night.

## 2017-07-16 NOTE — Progress Notes (Signed)
Diabetes Self-Management Education  Visit Type: First/Initial  Appt. Start Time:  1100 Appt. End Time: 1230  07/16/2017  Russell Clark, identified by name and date of birth, is a 51 y.o. male with a diagnosis of Diabetes: Type 2. Metformin 1000 mg BID, Lantus 80 units a day and Novolog 12 units with meals. Sees Dr. Fransico Him, Endocrinology.   ASSESSMENT Lab Results  Component Value Date   HGBA1C 10.8 04/17/2017   CMP Latest Ref Rng & Units 04/17/2017  BUN 4 - 21 15  Creatinine 0.6 - 1.3 1.2   Lipid Panel     Component Value Date/Time   CHOL 131 04/17/2017   TRIG 423 (A) 04/17/2017   HDL 23 (A) 04/17/2017      Height 5\' 8"  (1.727 m), weight 190 lb (86.2 kg). Body mass index is 28.89 kg/m.      Diabetes Self-Management Education - 07/13/17 1059      Visit Information   Visit Type First/Initial     Initial Visit   Diabetes Type Type 2   Are you currently following a meal plan? No   Are you taking your medications as prescribed? Yes   Date Diagnosed 2010     Health Coping   How would you rate your overall health? Good     Psychosocial Assessment   Patient Belief/Attitude about Diabetes Motivated to manage diabetes   Self-care barriers None   Self-management support Doctor's office   Other persons present Patient   Patient Concerns Nutrition/Meal planning;Medication;Monitoring;Problem Solving;Healthy Lifestyle;Weight Control   Special Needs None   Preferred Learning Style No preference indicated   Learning Readiness Ready   How often do you need to have someone help you when you read instructions, pamphlets, or other written materials from your doctor or pharmacy? 1 - Never   What is the last grade level you completed in school? 12     Pre-Education Assessment   Patient understands the diabetes disease and treatment process. Needs Instruction   Patient understands incorporating nutritional management into lifestyle. Needs Instruction   Patient undertands  incorporating physical activity into lifestyle. Needs Instruction   Patient understands using medications safely. Needs Instruction   Patient understands monitoring blood glucose, interpreting and using results Needs Instruction   Patient understands prevention, detection, and treatment of acute complications. Needs Instruction   Patient understands prevention, detection, and treatment of chronic complications. Needs Instruction   Patient understands how to develop strategies to address psychosocial issues. Needs Instruction   Patient understands how to develop strategies to promote health/change behavior. Needs Instruction     Complications   Last HgB A1C per patient/outside source 11 %   How often do you check your blood sugar? 3-4 times/day     Dietary Intake   Breakfast egg mcmuffin or boiled eggs, water   Snack (morning) tomato   Lunch pork roast, green beans, mashed potatoes, butter beans and tomatoes, water   Dinner Catfish,  water   Snack (evening) tomatoes   Beverage(s) water     Exercise   Exercise Type Light (walking / raking leaves)   How many days per week to you exercise? 10      Individualized Plan for Diabetes Self-Management Training:   Learning Objective:  Patient will have a greater understanding of diabetes self-management. Patient education plan is to attend individual and/or group sessions per assessed needs and concerns.   Plan:   Patient Instructions  Goals 1. Follow MY Plate 2. Eat three balanced meals 3  carb choices per meal 3. Increaser fresh fruits and vegetables. 4. Keep drinking water 5. Test blood sugar before each meal and at night.    Expected Outcomes:    Improved BS and reduce complications and increased knowledge.  Education material provided: Living Well with Diabetes, Food label handouts, A1C conversion sheet, Meal plan card, My Plate and Carbohydrate counting sheet  If problems or questions, patient to contact team via:  Phone and  Email  Future DSME appointment:   1 month

## 2017-07-28 LAB — LIPID PANEL
Cholesterol: 98 (ref 0–200)
HDL: 19 — AB (ref 35–70)
LDL Cholesterol: 49
TRIGLYCERIDES: 149 (ref 40–160)

## 2017-07-28 LAB — HEMOGLOBIN A1C: Hemoglobin A1C: 8.6

## 2017-07-28 LAB — BASIC METABOLIC PANEL
BUN: 15 (ref 4–21)
CREATININE: 1.1 (ref <=1.3)

## 2017-07-28 LAB — TSH: TSH: 0.83 (ref <=5.90)

## 2017-08-04 ENCOUNTER — Encounter: Payer: Self-pay | Admitting: Nutrition

## 2017-08-04 ENCOUNTER — Encounter: Payer: Self-pay | Admitting: "Endocrinology

## 2017-08-04 ENCOUNTER — Ambulatory Visit (INDEPENDENT_AMBULATORY_CARE_PROVIDER_SITE_OTHER): Payer: Medicaid - Out of State | Admitting: "Endocrinology

## 2017-08-04 ENCOUNTER — Encounter: Payer: Medicaid - Out of State | Attending: Nurse Practitioner | Admitting: Nutrition

## 2017-08-04 VITALS — BP 127/81 | HR 88 | Ht 66.0 in | Wt 192.0 lb

## 2017-08-04 VITALS — Ht 68.0 in | Wt 192.0 lb

## 2017-08-04 DIAGNOSIS — E669 Obesity, unspecified: Secondary | ICD-10-CM

## 2017-08-04 DIAGNOSIS — Z713 Dietary counseling and surveillance: Secondary | ICD-10-CM | POA: Insufficient documentation

## 2017-08-04 DIAGNOSIS — E782 Mixed hyperlipidemia: Secondary | ICD-10-CM

## 2017-08-04 DIAGNOSIS — E1165 Type 2 diabetes mellitus with hyperglycemia: Secondary | ICD-10-CM | POA: Diagnosis not present

## 2017-08-04 DIAGNOSIS — E118 Type 2 diabetes mellitus with unspecified complications: Secondary | ICD-10-CM

## 2017-08-04 DIAGNOSIS — I10 Essential (primary) hypertension: Secondary | ICD-10-CM | POA: Diagnosis not present

## 2017-08-04 DIAGNOSIS — IMO0002 Reserved for concepts with insufficient information to code with codable children: Secondary | ICD-10-CM

## 2017-08-04 MED ORDER — PRAVASTATIN SODIUM 40 MG PO TABS: 40.0000 mg | ORAL_TABLET | Freq: Every day | ORAL | 6 refills | Status: DC

## 2017-08-04 MED ORDER — INSULIN ASPART 100 UNIT/ML FLEXPEN: 8.0000 [IU] | PEN_INJECTOR | Freq: Three times a day (TID) | SUBCUTANEOUS | 2 refills | Status: DC

## 2017-08-04 NOTE — Patient Instructions (Signed)

## 2017-08-04 NOTE — Progress Notes (Signed)
Diabetes Self-Management Education  Visit Type:    Appt. Start Time:  1000 Appt. End Time: 1030  08/04/2017  Mr. Russell ButtersClaude Glaab, identified by name and date of birth, is a 51 y.o. male with a diagnosis of Diabetes:  Marland Kitchen. Metformin 1000 mg BID, Lantus 80 units a day and Novolog 12 units with meals. Sees Dr. Fransico HimNida, Endocrinology.  Now on CPAP machine. Can't exercise much due to neuropathy. Has changed a lot with eating better balanced meals, drinking water and cutting out snacks. He is reading food labels. Compliant with meds and recording BS readings. Doing very well. Only 1 low blood sguar of 55 mg/dl and not sure what happened.     Lab work shoes A1C 8.6% down from > 10%.  Cholesterol is much better TCHO 98, TG 149 mg/dl and HDL 19 mg/dl and LDL 49 mg/dl. Wt stable.  ASSESSMENT  CMP Latest Ref Rng & Units 04/17/2017  BUN 4 - 21 15  Creatinine 0.6 - 1.3 1.2   Lipid Panel     Height 5\' 8"  (1.727 m), weight 192 lb (87.1 kg). Body mass index is 29.19 kg/m.    Individualized Plan for Diabetes Self-Management Training:   Learning Objective:  Patient will have a greater understanding of diabetes self-management. Patient education plan is to attend individual and/or group sessions per assessed needs and concerns.   Plan:   Goals Increase low carb vegetables with lunch and dinner Keep drinking water Prevent low blood sugars Take insulin as prescribed. Get A1C to 7% Lose 10 lbs in 3 months Expected Outcomes:    Improved BS and reduce complications and increased knowledge.  Education material provided: Living Well with Diabetes, Food label handouts, A1C conversion sheet, Meal plan card, My Plate and Carbohydrate counting sheet  If problems or questions, patient to contact team via:  Phone and Email  Future DSME appointment:   3 month

## 2017-08-04 NOTE — Patient Instructions (Addendum)
Goals Increase low carb vegetables with lunch and dinner Keep drinking water Prevent low blood sugars Take insulin as prescribed. Get A1C to 7% Lose 10 lbs in 3 months

## 2017-08-04 NOTE — Progress Notes (Signed)
Subjective:    Patient ID: Russell Clark, male    DOB: 09-Jan-1966.  he is being seen in f/u  for management of currently uncontrolled symptomatic diabetes requested by  Barbie Haggis, FNP.   Past Medical History:  Diagnosis Date  . Diabetes mellitus, type II (HCC)   . Hyperlipidemia   . Hypertension    Past Surgical History:  Procedure Laterality Date  . OTHER SURGICAL HISTORY     L foot, Cataracts   Social History   Socioeconomic History  . Marital status: Married    Spouse name: None  . Number of children: None  . Years of education: None  . Highest education level: None  Social Needs  . Financial resource strain: None  . Food insecurity - worry: None  . Food insecurity - inability: None  . Transportation needs - medical: None  . Transportation needs - non-medical: None  Occupational History  . None  Tobacco Use  . Smoking status: Former Smoker    Packs/day: 1.00    Years: 25.00    Pack years: 25.00    Types: Cigarettes  . Smokeless tobacco: Never Used  Substance and Sexual Activity  . Alcohol use: No  . Drug use: No  . Sexual activity: None  Other Topics Concern  . None  Social History Narrative  . None   Outpatient Encounter Medications as of 08/04/2017  Medication Sig  . buPROPion (WELLBUTRIN SR) 150 MG 12 hr tablet Take 150 mg by mouth 2 (two) times daily.  . cetirizine (ZYRTEC) 10 MG tablet Take 10 mg by mouth daily.  . Cholecalciferol (VITAMIN D PO) Take by mouth.  . gabapentin (NEURONTIN) 600 MG tablet Take 600 mg by mouth 2 (two) times daily.  . insulin aspart (NOVOLOG FLEXPEN) 100 UNIT/ML FlexPen Inject 8-14 Units into the skin 3 (three) times daily with meals.  . Insulin Glargine (LANTUS SOLOSTAR) 100 UNIT/ML Solostar Pen Inject 70 Units into the skin at bedtime.  Marland Kitchen lisinopril (PRINIVIL,ZESTRIL) 2.5 MG tablet Take 2.5 mg by mouth daily.  Marland Kitchen loperamide (IMODIUM A-D) 2 MG tablet Take 12 mg by mouth daily.  Marland Kitchen losartan-hydrochlorothiazide  (HYZAAR) 100-12.5 MG tablet Take 1 tablet by mouth daily.  . metFORMIN (GLUCOPHAGE) 1000 MG tablet Take 1,000 mg by mouth 2 (two) times daily with a meal.  . nortriptyline (PAMELOR) 25 MG capsule Take 25 mg by mouth at bedtime.  Marland Kitchen omeprazole (PRILOSEC) 40 MG capsule Take 40 mg by mouth daily.  . pravastatin (PRAVACHOL) 40 MG tablet Take 1 tablet (40 mg total) by mouth daily.  . [DISCONTINUED] insulin aspart (NOVOLOG FLEXPEN) 100 UNIT/ML FlexPen Inject 12-18 Units into the skin 3 (three) times daily with meals.  . [DISCONTINUED] pravastatin (PRAVACHOL) 80 MG tablet Take 80 mg by mouth daily.   No facility-administered encounter medications on file as of 08/04/2017.     ALLERGIES: Allergies  Allergen Reactions  . Aspirin   . Darvon [Propoxyphene]   . Ibuprofen   . Penicillins     VACCINATION STATUS:  There is no immunization history on file for this patient.  Diabetes  He presents for his follow-up diabetic visit. He has type 2 diabetes mellitus. Onset time: He was diagnosed at approximate age of 41 years. His disease course has been improving. There are no hypoglycemic associated symptoms. Pertinent negatives for hypoglycemia include no confusion, headaches, pallor or seizures. Associated symptoms include blurred vision. Pertinent negatives for diabetes include no chest pain, no fatigue, no polydipsia, no  polyphagia, no polyuria and no weakness. There are no hypoglycemic complications. Symptoms are improving. Diabetic complications include peripheral neuropathy. Risk factors for coronary artery disease include dyslipidemia, diabetes mellitus, hypertension, male sex, sedentary lifestyle and tobacco exposure. Current diabetic treatment includes insulin injections and oral agent (dual therapy). Insulin injections are given by patient. His weight is increasing steadily. He is following a generally unhealthy diet. When asked about meal planning, he reported none. He never participates in exercise.  His home blood glucose trend is decreasing steadily. His breakfast blood glucose range is generally 180-200 mg/dl. His lunch blood glucose range is generally 180-200 mg/dl. His dinner blood glucose range is generally 180-200 mg/dl. His bedtime blood glucose range is generally 180-200 mg/dl. His overall blood glucose range is 180-200 mg/dl. An ACE inhibitor/angiotensin II receptor blocker is being taken. He sees a podiatrist.Eye exam is current.  Hyperlipidemia  This is a chronic problem. The current episode started more than 1 year ago. The problem is uncontrolled. Recent lipid tests were reviewed and are high. Exacerbating diseases include diabetes and obesity. Factors aggravating his hyperlipidemia include smoking. Pertinent negatives include no chest pain, myalgias or shortness of breath. Current antihyperlipidemic treatment includes statins. Risk factors for coronary artery disease include dyslipidemia, diabetes mellitus and a sedentary lifestyle.  Hypertension  This is a chronic problem. The current episode started more than 1 year ago. Associated symptoms include blurred vision. Pertinent negatives include no chest pain, headaches, neck pain, palpitations or shortness of breath. Risk factors for coronary artery disease include dyslipidemia, diabetes mellitus, male gender, sedentary lifestyle and smoking/tobacco exposure. Past treatments include angiotensin blockers.    Review of Systems  Constitutional: Negative for chills, fatigue, fever and unexpected weight change.  HENT: Negative for dental problem, mouth sores and trouble swallowing.   Eyes: Positive for blurred vision. Negative for visual disturbance.  Respiratory: Negative for cough, choking, chest tightness, shortness of breath and wheezing.   Cardiovascular: Negative for chest pain, palpitations and leg swelling.  Gastrointestinal: Negative for abdominal distention, abdominal pain, constipation, diarrhea, nausea and vomiting.  Endocrine:  Negative for polydipsia, polyphagia and polyuria.  Genitourinary: Negative for dysuria, flank pain, hematuria and urgency.  Musculoskeletal: Positive for gait problem. Negative for back pain, myalgias and neck pain.  Skin: Negative for pallor, rash and wound.  Neurological: Negative for seizures, syncope, weakness, numbness and headaches.  Psychiatric/Behavioral: Negative.  Negative for confusion and dysphoric mood.    Objective:    BP 127/81   Pulse 88   Ht 5\' 6"  (1.676 m)   Wt 192 lb (87.1 kg)   BMI 30.99 kg/m   Wt Readings from Last 3 Encounters:  08/04/17 192 lb (87.1 kg)  08/04/17 192 lb (87.1 kg)  07/13/17 190 lb (86.2 kg)     Physical Exam  Constitutional: He is oriented to person, place, and time. He appears well-developed and well-nourished. He is cooperative. No distress.  HENT:  Head: Normocephalic and atraumatic.  Eyes: EOM are normal.  Neck: Normal range of motion. Neck supple. No tracheal deviation present. No thyromegaly present.  Cardiovascular: Normal rate, S1 normal, S2 normal and normal heart sounds. Exam reveals no gallop.  No murmur heard. Pulses:      Dorsalis pedis pulses are 1+ on the right side, and 1+ on the left side.       Posterior tibial pulses are 1+ on the right side, and 1+ on the left side.  Pulmonary/Chest: Breath sounds normal. No respiratory distress. He has no wheezes.  Abdominal: Soft. Bowel sounds are normal. He exhibits no distension. There is no tenderness. There is no guarding and no CVA tenderness.  Musculoskeletal: He exhibits no edema.       Right shoulder: He exhibits no swelling and no deformity.  Neurological: He is alert and oriented to person, place, and time. He has normal strength and normal reflexes. No cranial nerve deficit or sensory deficit. Gait normal.  Skin: Skin is warm and dry. No rash noted. No cyanosis. Nails show no clubbing.  Psychiatric: He has a normal mood and affect. His speech is normal. Cognition and memory  are normal.    Recent Results (from the past 2160 hour(s))  Basic metabolic panel     Status: None   Collection Time: 07/28/17 12:00 AM  Result Value Ref Range   BUN 15 4 - 21   Creatinine 1.1 0.6 - 1.3  Lipid panel     Status: Abnormal   Collection Time: 07/28/17 12:00 AM  Result Value Ref Range   Triglycerides 149 40 - 160   Cholesterol 98 0 - 200   HDL 19 (A) 35 - 70   LDL Cholesterol 49   Hemoglobin A1c     Status: None   Collection Time: 07/28/17 12:00 AM  Result Value Ref Range   Hemoglobin A1C 8.6   TSH     Status: None   Collection Time: 07/28/17 12:00 AM  Result Value Ref Range   TSH 0.83 0.41 - 5.90    Assessment & Plan:   1. Uncontrolled type 2 diabetes mellitus with other specified complication, with Schroth-term current use of insulin (HCC)  - Patient has currently uncontrolled symptomatic type 2 DM since  51 years of age. - He came with improving blood glucose profile, with recent A1c of 8.6% improving from 10.8%. Recent labs reviewed, showing normal renal function,  significantly improved repeat profile.  -his diabetes is complicated by peripheral neuropathy, sedentary life and Celene KrasClaude E Raus remains at a high risk for more acute and chronic complications which include CAD, CVA, CKD, retinopathy, and neuropathy. These are all discussed in detail with the patient.  - I have counseled him on diet management and weight loss, by adopting a carbohydrate restricted/protein rich diet.  -  Suggestion is made for him to avoid simple carbohydrates  from his diet including Cakes, Sweet Desserts / Pastries, Ice Cream, Soda (diet and regular), Sweet Tea, Candies, Chips, Cookies, Store Bought Juices, Alcohol in Excess of  1-2 drinks a day, Artificial Sweeteners, and "Sugar-free" Products. This will help patient to have stable blood glucose profile and potentially avoid unintended weight gain.   - I encouraged him to switch to  unprocessed or minimally processed complex starch and  increased protein intake (animal or plant source), fruits, and vegetables.  - he is advised to stick to a routine mealtimes to eat 3 meals  a day and avoid unnecessary snacks ( to snack only to correct hypoglycemia).    - I have approached him with the following individualized plan to manage diabetes and patient agrees:   -  He will  continue to require basal/bolus insulin to treat diabetes target. - I have advised him to decrease Lantus to 70 units daily at bedtime, decrease prandial insulin NovoLog to 8 units 3 times a day before meals for pre-meal blood glucose above 90 mg/dL plus patient specific correction dose for pre-meal blood glucose above 150 mg/dL, associated with strict monitoring of blood glucose 4 times a day-before  meals and at bedtime.  - Patient is warned not to take insulin without proper monitoring per orders. -Patient is encouraged to call clinic for blood glucose levels less than 70 or above 300 mg /dl. - I will continue metformin 1000 mg by mouth twice a day. - Patient specific target  A1c;  LDL, HDL, Triglycerides, and  Waist Circumference were discussed in detail.  2) BP/HTN: Controlled. Continue current medications including ACEI/ARB. 3) Lipids/HPL:   Significantly improved lipid panel triglycerides 149 improving from 423. Better control of diabetes  helped control triglycerides.   Patient is advised to continue statins at a lower dose of 40 mg by mouth daily at bedtime, he'll not require Lovaza at this time. 4)  Weight/Diet: CDE Consult  in progress , exercise, and detailed carbohydrates information provided.  5) Chronic Care/Health Maintenance:  -he  is on ACEI/ARB and Statin medications and  is encouraged to continue to follow up with Ophthalmology, Dentist,  Podiatrist at least yearly or according to recommendations, and advised to  stay away from smoking. I have recommended yearly flu vaccine and pneumonia vaccination at least every 5 years; moderate intensity  exercise for up to 150 minutes weekly; and  sleep for at least 7 hours a day.  - I advised patient to maintain close follow up with Barbie Haggis, FNP for primary care needs. - Time spent with the patient: 25 min, of which >50% was spent in reviewing his sugar logs , discussing his hypo- and hyper-glycemic episodes, reviewing his current and  previous labs and insulin doses and developing a plan to avoid hypo- and hyper-glycemia.    Follow up plan: - Return in about 3 months (around 11/04/2017) for follow up with pre-visit labs, meter, and logs.  Marquis Lunch, MD Phone: [phone]  Fax: [phone]   08/04/2017, 1:22 PM   This note was partially dictated with voice recognition software. Similar sounding words can be transcribed inadequately or may not  be corrected upon review.

## 2017-09-16 ENCOUNTER — Telehealth: Payer: Self-pay | Admitting: "Endocrinology

## 2017-09-16 MED ORDER — INSULIN GLARGINE 100 UNIT/ML SOLOSTAR PEN: 70.0000 [IU] | PEN_INJECTOR | Freq: Every day | SUBCUTANEOUS | 2 refills | Status: DC

## 2017-09-16 NOTE — Telephone Encounter (Signed)
Celene KrasClaude is asking for a refill on Insulin Glargine (LANTUS SOLOSTAR) 100 UNIT/ML Solostar Pen  Please advise?

## 2017-11-03 LAB — BASIC METABOLIC PANEL
BUN: 21 (ref 4–21)
Creatinine: 1.3 (ref 0.6–1.3)

## 2017-11-03 LAB — HEMOGLOBIN A1C: Hemoglobin A1C: 9.6

## 2017-11-03 LAB — HEPATIC FUNCTION PANEL
ALT: 40 (ref 10–40)
AST: 22 (ref 14–40)
Alkaline Phosphatase: 129 — AB (ref 25–125)

## 2017-11-07 ENCOUNTER — Other Ambulatory Visit: Payer: Self-pay | Admitting: "Endocrinology

## 2017-11-10 ENCOUNTER — Encounter: Payer: Self-pay | Admitting: "Endocrinology

## 2017-11-10 ENCOUNTER — Ambulatory Visit (INDEPENDENT_AMBULATORY_CARE_PROVIDER_SITE_OTHER): Payer: Medicaid - Out of State | Admitting: "Endocrinology

## 2017-11-10 ENCOUNTER — Encounter: Payer: Medicaid - Out of State | Attending: Nurse Practitioner | Admitting: Nutrition

## 2017-11-10 ENCOUNTER — Encounter: Payer: Self-pay | Admitting: Nutrition

## 2017-11-10 VITALS — BP 119/81 | HR 94 | Ht 66.0 in | Wt 199.0 lb

## 2017-11-10 VITALS — Ht 68.0 in | Wt 199.0 lb

## 2017-11-10 DIAGNOSIS — E1165 Type 2 diabetes mellitus with hyperglycemia: Secondary | ICD-10-CM | POA: Diagnosis not present

## 2017-11-10 DIAGNOSIS — E782 Mixed hyperlipidemia: Secondary | ICD-10-CM | POA: Diagnosis not present

## 2017-11-10 DIAGNOSIS — E6609 Other obesity due to excess calories: Secondary | ICD-10-CM | POA: Diagnosis not present

## 2017-11-10 DIAGNOSIS — I10 Essential (primary) hypertension: Secondary | ICD-10-CM | POA: Diagnosis not present

## 2017-11-10 DIAGNOSIS — E1169 Type 2 diabetes mellitus with other specified complication: Secondary | ICD-10-CM | POA: Insufficient documentation

## 2017-11-10 DIAGNOSIS — Z794 Long term (current) use of insulin: Secondary | ICD-10-CM | POA: Insufficient documentation

## 2017-11-10 DIAGNOSIS — E669 Obesity, unspecified: Secondary | ICD-10-CM

## 2017-11-10 DIAGNOSIS — Z6832 Body mass index (BMI) 32.0-32.9, adult: Secondary | ICD-10-CM

## 2017-11-10 DIAGNOSIS — E118 Type 2 diabetes mellitus with unspecified complications: Secondary | ICD-10-CM

## 2017-11-10 DIAGNOSIS — Z713 Dietary counseling and surveillance: Secondary | ICD-10-CM | POA: Insufficient documentation

## 2017-11-10 DIAGNOSIS — E66811 Obesity, class 1: Secondary | ICD-10-CM | POA: Insufficient documentation

## 2017-11-10 DIAGNOSIS — IMO0002 Reserved for concepts with insufficient information to code with codable children: Secondary | ICD-10-CM

## 2017-11-10 MED ORDER — INSULIN ASPART 100 UNIT/ML FLEXPEN: 15.0000 [IU] | PEN_INJECTOR | Freq: Three times a day (TID) | SUBCUTANEOUS | 2 refills | Status: DC

## 2017-11-10 MED ORDER — INSULIN GLARGINE 100 UNIT/ML SOLOSTAR PEN: 80.0000 [IU] | PEN_INJECTOR | Freq: Every day | SUBCUTANEOUS | 2 refills | Status: DC

## 2017-11-10 MED ORDER — FREESTYLE LIBRE SENSOR SYSTEM MISC: 2 refills | Status: DC

## 2017-11-10 MED ORDER — FREESTYLE LIBRE READER DEVI: 1.0000 | Freq: Once | 0 refills | Status: AC

## 2017-11-10 NOTE — Patient Instructions (Signed)
Goals 1. Be sure to eat 3 carb choices per meal 2. Increase vegetables with lunch and dinner 3. Inject insulin in abdomen instead of legs and arms. Keep testing blood sugars as instructions

## 2017-11-10 NOTE — Progress Notes (Signed)
Subjective:    Patient ID: Russell Clark, male    DOB: 1966/08/01.  he is being seen in follow-up for management of currently uncontrolled symptomatic diabetes requested by  Barbie Haggis, FNP.   Past Medical History:  Diagnosis Date  . Diabetes mellitus, type II (HCC)   . Hyperlipidemia   . Hypertension    Past Surgical History:  Procedure Laterality Date  . OTHER SURGICAL HISTORY     L foot, Cataracts   Social History   Socioeconomic History  . Marital status: Married    Spouse name: None  . Number of children: None  . Years of education: None  . Highest education level: None  Social Needs  . Financial resource strain: None  . Food insecurity - worry: None  . Food insecurity - inability: None  . Transportation needs - medical: None  . Transportation needs - non-medical: None  Occupational History  . None  Tobacco Use  . Smoking status: Former Smoker    Packs/day: 1.00    Years: 25.00    Pack years: 25.00    Types: Cigarettes  . Smokeless tobacco: Never Used  Substance and Sexual Activity  . Alcohol use: No  . Drug use: No  . Sexual activity: None  Other Topics Concern  . None  Social History Narrative  . None   Outpatient Encounter Medications as of 11/10/2017  Medication Sig  . pregabalin (LYRICA) 200 MG capsule Take 200 mg by mouth 3 (three) times daily.  Marland Kitchen buPROPion (WELLBUTRIN SR) 150 MG 12 hr tablet Take 150 mg by mouth 2 (two) times daily.  . cetirizine (ZYRTEC) 10 MG tablet Take 10 mg by mouth daily.  . Cholecalciferol (VITAMIN D PO) Take by mouth.  . Continuous Blood Gluc Receiver (FREESTYLE LIBRE READER) DEVI 1 Piece by Does not apply route once for 1 dose.  . Continuous Blood Gluc Sensor (FREESTYLE LIBRE SENSOR SYSTEM) MISC Use one sensor every 10 days.  . insulin aspart (NOVOLOG FLEXPEN) 100 UNIT/ML FlexPen Inject 15-21 Units into the skin 3 (three) times daily with meals.  . Insulin Glargine (LANTUS SOLOSTAR) 100 UNIT/ML Solostar Pen  Inject 80 Units into the skin at bedtime.  Marland Kitchen lisinopril (PRINIVIL,ZESTRIL) 2.5 MG tablet Take 2.5 mg by mouth daily.  Marland Kitchen loperamide (IMODIUM A-D) 2 MG tablet Take 12 mg by mouth daily.  Marland Kitchen losartan-hydrochlorothiazide (HYZAAR) 100-12.5 MG tablet Take 1 tablet by mouth daily.  . metFORMIN (GLUCOPHAGE) 1000 MG tablet Take 1,000 mg by mouth 2 (two) times daily with a meal.  . nortriptyline (PAMELOR) 25 MG capsule Take 25 mg by mouth at bedtime.  Marland Kitchen omeprazole (PRILOSEC) 40 MG capsule Take 40 mg by mouth daily.  . pravastatin (PRAVACHOL) 40 MG tablet Take 1 tablet (40 mg total) by mouth daily.  . [DISCONTINUED] gabapentin (NEURONTIN) 600 MG tablet Take 600 mg by mouth 2 (two) times daily.  . [DISCONTINUED] insulin aspart (NOVOLOG FLEXPEN) 100 UNIT/ML FlexPen Inject 8-14 Units into the skin 3 (three) times daily with meals.  . [DISCONTINUED] Insulin Glargine (LANTUS SOLOSTAR) 100 UNIT/ML Solostar Pen Inject 70 Units into the skin at bedtime.  . [DISCONTINUED] NOVOLOG FLEXPEN 100 UNIT/ML FlexPen INJECT 10 TO 16 UNITS SUBCUTANEOUSLY THREE TIMES DAILY WITH MEALS   No facility-administered encounter medications on file as of 11/10/2017.     ALLERGIES: Allergies  Allergen Reactions  . Aspirin   . Darvon [Propoxyphene]   . Ibuprofen   . Penicillins     VACCINATION STATUS:  There is no immunization history on file for this patient.  Diabetes  He presents for his follow-up diabetic visit. He has type 2 diabetes mellitus. Onset time: He was diagnosed at approximate age of 41 years. His disease course has been worsening. There are no hypoglycemic associated symptoms. Pertinent negatives for hypoglycemia include no confusion, headaches, pallor or seizures. Associated symptoms include blurred vision, polydipsia and polyuria. Pertinent negatives for diabetes include no chest pain, no fatigue, no polyphagia and no weakness. There are no hypoglycemic complications. Symptoms are worsening. Diabetic  complications include peripheral neuropathy. Risk factors for coronary artery disease include dyslipidemia, diabetes mellitus, hypertension, male sex, sedentary lifestyle and tobacco exposure. Current diabetic treatment includes insulin injections and oral agent (dual therapy). Insulin injections are given by patient. His weight is increasing steadily. He is following a generally unhealthy diet. When asked about meal planning, he reported none. He never participates in exercise. His home blood glucose trend is decreasing steadily. His breakfast blood glucose range is generally >200 mg/dl. His lunch blood glucose range is generally >200 mg/dl. His dinner blood glucose range is generally >200 mg/dl. His bedtime blood glucose range is generally >200 mg/dl. His overall blood glucose range is >200 mg/dl. An ACE inhibitor/angiotensin II receptor blocker is being taken. He sees a podiatrist.Eye exam is current.  Hyperlipidemia  This is a chronic problem. The current episode started more than 1 year ago. The problem is uncontrolled. Recent lipid tests were reviewed and are high. Exacerbating diseases include diabetes and obesity. Factors aggravating his hyperlipidemia include smoking. Pertinent negatives include no chest pain, myalgias or shortness of breath. Current antihyperlipidemic treatment includes statins. Risk factors for coronary artery disease include dyslipidemia, diabetes mellitus, a sedentary lifestyle and obesity.  Hypertension  This is a chronic problem. The current episode started more than 1 year ago. The problem is controlled. Associated symptoms include blurred vision. Pertinent negatives include no chest pain, headaches, neck pain, palpitations or shortness of breath. Risk factors for coronary artery disease include dyslipidemia, diabetes mellitus, male gender, sedentary lifestyle and smoking/tobacco exposure. Past treatments include angiotensin blockers.    Review of Systems  Constitutional:  Negative for chills, fatigue, fever and unexpected weight change.  HENT: Negative for dental problem, mouth sores and trouble swallowing.   Eyes: Positive for blurred vision. Negative for visual disturbance.  Respiratory: Negative for cough, choking, chest tightness, shortness of breath and wheezing.   Cardiovascular: Negative for chest pain, palpitations and leg swelling.  Gastrointestinal: Negative for abdominal distention, abdominal pain, constipation, diarrhea, nausea and vomiting.  Endocrine: Positive for polydipsia and polyuria. Negative for polyphagia.  Genitourinary: Negative for dysuria, flank pain, hematuria and urgency.  Musculoskeletal: Positive for gait problem. Negative for back pain, myalgias and neck pain.  Skin: Negative for pallor, rash and wound.  Neurological: Negative for seizures, syncope, weakness, numbness and headaches.  Psychiatric/Behavioral: Negative.  Negative for confusion and dysphoric mood.    Objective:    BP 119/81   Pulse 94   Ht 5\' 6"  (1.676 m)   Wt 199 lb (90.3 kg)   BMI 32.12 kg/m   Wt Readings from Last 3 Encounters:  11/10/17 199 lb (90.3 kg)  11/10/17 199 lb (90.3 kg)  08/04/17 192 lb (87.1 kg)     Physical Exam  Constitutional: He is oriented to person, place, and time. He appears well-developed and well-nourished. He is cooperative. No distress.  HENT:  Head: Normocephalic and atraumatic.  Eyes: EOM are normal.  Neck: Normal range of motion.  Neck supple. No tracheal deviation present. No thyromegaly present.  Cardiovascular: Normal rate, S1 normal, S2 normal and normal heart sounds. Exam reveals no gallop.  No murmur heard. Pulses:      Dorsalis pedis pulses are 1+ on the right side, and 1+ on the left side.       Posterior tibial pulses are 1+ on the right side, and 1+ on the left side.  Pulmonary/Chest: Breath sounds normal. No respiratory distress. He has no wheezes.  Abdominal: Soft. Bowel sounds are normal. He exhibits no  distension. There is no tenderness. There is no guarding and no CVA tenderness.  Musculoskeletal: He exhibits no edema.       Right shoulder: He exhibits no swelling and no deformity.  Neurological: He is alert and oriented to person, place, and time. He has normal strength and normal reflexes. No cranial nerve deficit or sensory deficit. Gait normal.  Skin: Skin is warm and dry. No rash noted. No cyanosis. Nails show no clubbing.  Psychiatric: He has a normal mood and affect. His speech is normal. Cognition and memory are normal.    Recent Results (from the past 2160 hour(s))  Basic metabolic panel     Status: None   Collection Time: 11/03/17 12:00 AM  Result Value Ref Range   BUN 21 4 - 21   Creatinine 1.3 0.6 - 1.3  Hepatic function panel     Status: Abnormal   Collection Time: 11/03/17 12:00 AM  Result Value Ref Range   Alkaline Phosphatase 129 (A) 25 - 125   ALT 40 10 - 40   AST 22 14 - 40  Hemoglobin A1c     Status: None   Collection Time: 11/03/17 12:00 AM  Result Value Ref Range   Hemoglobin A1C 9.6     Assessment & Plan:   1. Uncontrolled type 2 diabetes mellitus with other specified complication, with Knueppel-term current use of insulin (HCC)  - Patient has currently uncontrolled symptomatic type 2 DM since  52 years of age. - He came with  worsening blood glucose profile, with A1c increasing to 9.6% from 8.6% last visit.  -Recent labs reviewed, showing normal renal function,  significantly improved repeat profile.  -his diabetes is complicated by peripheral neuropathy, sedentary life and Celene KrasClaude E Mosco remains at a high risk for more acute and chronic complications which include CAD, CVA, CKD, retinopathy, and neuropathy. These are all discussed in detail with the patient.  - I have counseled him on diet management and weight loss, by adopting a carbohydrate restricted/protein rich diet.  -  Suggestion is made for him to avoid simple carbohydrates  from his diet  including Cakes, Sweet Desserts / Pastries, Ice Cream, Soda (diet and regular), Sweet Tea, Candies, Chips, Cookies, Store Bought Juices, Alcohol in Excess of  1-2 drinks a day, Artificial Sweeteners, and "Sugar-free" Products. This will help patient to have stable blood glucose profile and potentially avoid unintended weight gain.   - I encouraged him to switch to  unprocessed or minimally processed complex starch and increased protein intake (animal or plant source), fruits, and vegetables.  - he is advised to stick to a routine mealtimes to eat 3 meals  a day and avoid unnecessary snacks ( to snack only to correct hypoglycemia).    - I have approached him with the following individualized plan to manage diabetes and patient agrees:   -  He will  continue to require basal/bolus insulin to treat diabetes target. -He  has been injecting insulin on his thighs and arms instead of his abdomen. -I advised him to shift to abdominal skin insulin injection, increase his Lantus to 80 units nightly, increase his NovoLog to 15  units 3 times a day before meals for pre-meal blood glucose above 90 mg/dL plus patient specific correction dose for pre-meal blood glucose above 150 mg/dL, associated with strict monitoring of blood glucose 4 times a day-before meals and at bedtime.  - Patient is warned not to take insulin without proper monitoring per orders. -Patient is encouraged to call clinic for blood glucose levels less than 70 or above 300 mg /dl. - I will continue metformin 1000 mg by mouth twice a day. - Patient specific target  A1c;  LDL, HDL, Triglycerides, and  Waist Circumference were discussed in detail.  2) BP/HTN: Controlled. Continue current medications including ACEI/ARB. 3) Lipids/HPL:   Significantly improved lipid panel triglycerides 149 improving from 423. Better control of diabetes  helped control triglycerides.   Patient is advised to continue statins at a lower dose of 40 mg by mouth daily at  bedtime, he'll not require Lovaza at this time. 4)  Weight/Diet: CDE Consult  in progress , exercise, and detailed carbohydrates information provided.  5) Chronic Care/Health Maintenance:  -he  is on ACEI/ARB and Statin medications and  is encouraged to continue to follow up with Ophthalmology, Dentist,  Podiatrist at least yearly or according to recommendations, and advised to  stay away from smoking. I have recommended yearly flu vaccine and pneumonia vaccination at least every 5 years; moderate intensity exercise for up to 150 minutes weekly; and  sleep for at least 7 hours a day.  - I advised patient to maintain close follow up with Barbie Haggis, FNP for primary care needs.  - Time spent with the patient: 25 min, of which >50% was spent in reviewing his blood glucose logs , discussing his hypo- and hyper-glycemic episodes, reviewing his current and  previous labs and insulin doses and developing a plan to avoid hypo- and hyper-glycemia. Please refer to Patient Instructions for Blood Glucose Monitoring and Insulin/Medications Dosing Guide"  in media tab for additional information.  Follow up plan: - Return in about 1 week (around 11/17/2017) for follow up with meter and logs- no labs.  Marquis Lunch, MD Phone: [phone]  Fax: [phone]   11/10/2017, 1:57 PM   This note was partially dictated with voice recognition software. Similar sounding words can be transcribed inadequately or may not  be corrected upon review.

## 2017-11-10 NOTE — Progress Notes (Signed)
Diabetes Self-Management Education  Visit Type:    Appt. Start Time:  1000 Appt. End Time: 1030  11/10/2017  Mr. Russell Clark, identified by name and date of birth, is a 52 y.o. male with a diagnosis of Diabetes:  Marland Kitchen Metformin 1000 mg BID, Lantus 80 units a day and Novolog 12 units with meals. Sees Dr. Dorris Fetch, Endocrinology.  Recently changed from Neurontin to Lyrica for neuropathy. Complains of pain in hands and feet. Drinking 8+ bottes of water per day.  A1C up to 9.6%.  BUN H 21 mg/dl,  eGFR > 60,  Alk Phos 129 H. Been injection in arm and legs and suggested he change it to injecting into abdomen for better blood sugar control. Unable to excise due to neuropathy.    200 mg of Lyrica thee times per day Complains of pain in feet and hands. Diet is doing better. He needs more consistent CHO at meals and not to skip meals.  He is making an effort to eat better.  Lab Results  Component Value Date   HGBA1C 9.6 11/03/2017      ASSESSMENT  CMP Latest Ref Rng & Units 07/28/2017 04/17/2017  BUN 4 - 21 15 15   Creatinine 0.6 - 1.3 1.1 1.2   Lipid Panel     Height 5' 8"  (1.727 m), weight 199 lb (90.3 kg). Body mass index is 30.26 kg/m.  .    Individualized Plan for Diabetes Self-Management Training:   Learning Objective:  Patient will have a greater understanding of diabetes self-management. Patient education plan is to attend individual and/or group sessions per assessed needs and concerns.   Plan:   Goals 1. Be sure to eat 3 carb choices per meal 2. Increase vegetables with lunch and dinner 3. Inject insulin in abdomen instead of legs and arms. Keep testing blood sugars as instructions  Expected Outcomes:    Improved BS and reduce complications and increased knowledge.  Education material provided: Living Well with Diabetes, Food label handouts, A1C conversion sheet, Meal plan card, My Plate and Carbohydrate counting sheet  If problems or questions, patient to contact team  via:  Phone and Email  Future DSME appointment:   3 month

## 2017-11-10 NOTE — Patient Instructions (Signed)

## 2017-11-26 ENCOUNTER — Ambulatory Visit (INDEPENDENT_AMBULATORY_CARE_PROVIDER_SITE_OTHER): Payer: Medicaid - Out of State | Admitting: "Endocrinology

## 2017-11-26 ENCOUNTER — Encounter: Payer: Self-pay | Admitting: "Endocrinology

## 2017-11-26 VITALS — BP 123/76 | HR 98 | Ht 66.0 in | Wt 206.0 lb

## 2017-11-26 DIAGNOSIS — I10 Essential (primary) hypertension: Secondary | ICD-10-CM | POA: Diagnosis not present

## 2017-11-26 DIAGNOSIS — E6609 Other obesity due to excess calories: Secondary | ICD-10-CM

## 2017-11-26 DIAGNOSIS — E782 Mixed hyperlipidemia: Secondary | ICD-10-CM

## 2017-11-26 DIAGNOSIS — E1165 Type 2 diabetes mellitus with hyperglycemia: Secondary | ICD-10-CM

## 2017-11-26 DIAGNOSIS — Z6832 Body mass index (BMI) 32.0-32.9, adult: Secondary | ICD-10-CM

## 2017-11-26 NOTE — Progress Notes (Signed)
Pt brings in hisnewFreestyle Libretoday.Hehas the 14day reader and sensors. Went over instructions on how to use the Libre system. Also went over instruction on how to properly apply the sensor to herarm. Libre sensor was placed on the back of pts upper left arm. Hevoices understanding on how to use the system and how to get BG reading for insulin injections and as needed. Hewas advised to contact the office if he has any questions or concerns.   

## 2017-11-26 NOTE — Progress Notes (Signed)
Subjective:    Patient ID: Russell Clark, male    DOB: 21-Dec-1965.  he is being seen in follow-up for management of currently uncontrolled symptomatic diabetes requested by  Barbie Haggis, FNP.   Past Medical History:  Diagnosis Date  . Diabetes mellitus, type II (HCC)   . Hyperlipidemia   . Hypertension    Past Surgical History:  Procedure Laterality Date  . OTHER SURGICAL HISTORY     L foot, Cataracts   Social History   Socioeconomic History  . Marital status: Married    Spouse name: None  . Number of children: None  . Years of education: None  . Highest education level: None  Social Needs  . Financial resource strain: None  . Food insecurity - worry: None  . Food insecurity - inability: None  . Transportation needs - medical: None  . Transportation needs - non-medical: None  Occupational History  . None  Tobacco Use  . Smoking status: Former Smoker    Packs/day: 1.00    Years: 25.00    Pack years: 25.00    Types: Cigarettes  . Smokeless tobacco: Never Used  Substance and Sexual Activity  . Alcohol use: No  . Drug use: No  . Sexual activity: None  Other Topics Concern  . None  Social History Narrative  . None   Outpatient Encounter Medications as of 11/26/2017  Medication Sig  . buPROPion (WELLBUTRIN SR) 150 MG 12 hr tablet Take 150 mg by mouth 2 (two) times daily.  . Cholecalciferol (VITAMIN D PO) Take by mouth.  . Continuous Blood Gluc Sensor (FREESTYLE LIBRE SENSOR SYSTEM) MISC Use one sensor every 10 days.  . insulin aspart (NOVOLOG FLEXPEN) 100 UNIT/ML FlexPen Inject 15-21 Units into the skin 3 (three) times daily with meals.  . Insulin Glargine (LANTUS SOLOSTAR) 100 UNIT/ML Solostar Pen Inject 80 Units into the skin at bedtime.  Marland Kitchen lisinopril (PRINIVIL,ZESTRIL) 2.5 MG tablet Take 2.5 mg by mouth daily.  Marland Kitchen loperamide (IMODIUM A-D) 2 MG tablet Take 12 mg by mouth daily.  Marland Kitchen losartan-hydrochlorothiazide (HYZAAR) 100-12.5 MG tablet Take 1 tablet by  mouth daily.  . metFORMIN (GLUCOPHAGE) 1000 MG tablet Take 1,000 mg by mouth 2 (two) times daily with a meal.  . nortriptyline (PAMELOR) 25 MG capsule Take 25 mg by mouth at bedtime.  Marland Kitchen omeprazole (PRILOSEC) 40 MG capsule Take 40 mg by mouth daily.  . pravastatin (PRAVACHOL) 40 MG tablet Take 1 tablet (40 mg total) by mouth daily.  . pregabalin (LYRICA) 200 MG capsule Take 200 mg by mouth 3 (three) times daily.  . [DISCONTINUED] cetirizine (ZYRTEC) 10 MG tablet Take 10 mg by mouth daily.   No facility-administered encounter medications on file as of 11/26/2017.     ALLERGIES: Allergies  Allergen Reactions  . Aspirin   . Darvon [Propoxyphene]   . Ibuprofen   . Penicillins     VACCINATION STATUS:  There is no immunization history on file for this patient.  Diabetes  He presents for his follow-up diabetic visit. He has type 2 diabetes mellitus. Onset time: He was diagnosed at approximate age of 41 years. His disease course has been improving. There are no hypoglycemic associated symptoms. Pertinent negatives for hypoglycemia include no confusion, headaches, pallor or seizures. Associated symptoms include blurred vision, polydipsia and polyuria. Pertinent negatives for diabetes include no chest pain, no fatigue, no polyphagia and no weakness. There are no hypoglycemic complications. Symptoms are improving. Diabetic complications include peripheral neuropathy. Risk  factors for coronary artery disease include dyslipidemia, diabetes mellitus, hypertension, male sex, sedentary lifestyle and tobacco exposure. Current diabetic treatment includes insulin injections and oral agent (dual therapy). Insulin injections are given by patient. His weight is increasing steadily. He is following a generally unhealthy diet. When asked about meal planning, he reported none. He never participates in exercise. His home blood glucose trend is decreasing steadily. His breakfast blood glucose range is generally >200  mg/dl. His lunch blood glucose range is generally 180-200 mg/dl. His dinner blood glucose range is generally 180-200 mg/dl. His bedtime blood glucose range is generally >200 mg/dl. His overall blood glucose range is >200 mg/dl. An ACE inhibitor/angiotensin II receptor blocker is being taken. He sees a podiatrist.Eye exam is current.  Hyperlipidemia  This is a chronic problem. The current episode started more than 1 year ago. The problem is uncontrolled. Recent lipid tests were reviewed and are high. Exacerbating diseases include diabetes and obesity. Factors aggravating his hyperlipidemia include smoking. Pertinent negatives include no chest pain, myalgias or shortness of breath. Current antihyperlipidemic treatment includes statins. Risk factors for coronary artery disease include dyslipidemia, diabetes mellitus, a sedentary lifestyle and obesity.  Hypertension  This is a chronic problem. The current episode started more than 1 year ago. The problem is controlled. Associated symptoms include blurred vision. Pertinent negatives include no chest pain, headaches, neck pain, palpitations or shortness of breath. Risk factors for coronary artery disease include dyslipidemia, diabetes mellitus, male gender, sedentary lifestyle and smoking/tobacco exposure. Past treatments include angiotensin blockers.    Review of Systems  Constitutional: Negative for chills, fatigue, fever and unexpected weight change.  HENT: Negative for dental problem, mouth sores and trouble swallowing.   Eyes: Positive for blurred vision. Negative for visual disturbance.  Respiratory: Negative for cough, choking, chest tightness, shortness of breath and wheezing.   Cardiovascular: Negative for chest pain, palpitations and leg swelling.  Gastrointestinal: Negative for abdominal distention, abdominal pain, constipation, diarrhea, nausea and vomiting.  Endocrine: Positive for polydipsia and polyuria. Negative for polyphagia.   Genitourinary: Negative for dysuria, flank pain, hematuria and urgency.  Musculoskeletal: Positive for gait problem. Negative for back pain, myalgias and neck pain.  Skin: Negative for pallor, rash and wound.  Neurological: Negative for seizures, syncope, weakness, numbness and headaches.  Psychiatric/Behavioral: Negative.  Negative for confusion and dysphoric mood.    Objective:    BP 123/76   Pulse 98   Ht 5\' 6"  (1.676 m)   Wt 206 lb (93.4 kg)   BMI 33.25 kg/m   Wt Readings from Last 3 Encounters:  11/26/17 206 lb (93.4 kg)  11/10/17 199 lb (90.3 kg)  11/10/17 199 lb (90.3 kg)     Physical Exam  Constitutional: He is oriented to person, place, and time. He appears well-developed and well-nourished. He is cooperative. No distress.  HENT:  Head: Normocephalic and atraumatic.  Eyes: EOM are normal.  Neck: Normal range of motion. Neck supple. No tracheal deviation present. No thyromegaly present.  Cardiovascular: Normal rate, S1 normal, S2 normal and normal heart sounds. Exam reveals no gallop.  No murmur heard. Pulses:      Dorsalis pedis pulses are 1+ on the right side, and 1+ on the left side.       Posterior tibial pulses are 1+ on the right side, and 1+ on the left side.  Pulmonary/Chest: Breath sounds normal. No respiratory distress. He has no wheezes.  Abdominal: Soft. Bowel sounds are normal. He exhibits no distension. There is no  tenderness. There is no guarding and no CVA tenderness.  Musculoskeletal: He exhibits no edema.       Right shoulder: He exhibits no swelling and no deformity.  Neurological: He is alert and oriented to person, place, and time. He has normal strength and normal reflexes. No cranial nerve deficit or sensory deficit. Gait normal.  Skin: Skin is warm and dry. No rash noted. No cyanosis. Nails show no clubbing.  Psychiatric: He has a normal mood and affect. His speech is normal. Cognition and memory are normal.    Recent Results (from the past  2160 hour(s))  Basic metabolic panel     Status: None   Collection Time: 11/03/17 12:00 AM  Result Value Ref Range   BUN 21 4 - 21   Creatinine 1.3 0.6 - 1.3  Hepatic function panel     Status: Abnormal   Collection Time: 11/03/17 12:00 AM  Result Value Ref Range   Alkaline Phosphatase 129 (A) 25 - 125   ALT 40 10 - 40   AST 22 14 - 40  Hemoglobin A1c     Status: None   Collection Time: 11/03/17 12:00 AM  Result Value Ref Range   Hemoglobin A1C 9.6     Assessment & Plan:   1. Uncontrolled type 2 diabetes mellitus with other specified complication, with Spacek-term current use of insulin (HCC)  - Patient has currently uncontrolled symptomatic type 2 DM since  52 years of age. - He came with slight improvement in both his fasting and postprandial BG profile. His recent  A1c was increasing to 9.6% from 8.6% last visit.  -Recent labs reviewed, showing normal renal function,  significantly improved repeat profile.  -his diabetes is complicated by peripheral neuropathy, sedentary life and Royer E Farnworth remains at a high risk for more acute and chronic complications which include CAD, CVA, CKD, retinopathy, and neuropathy. These are all discussed in detail with the patient.  - I have counseled him on diet management and weight loss, by adopting a carbohydrate restricted/protein rich diet. -  Suggestion is made for him to avoid simple carbohydrates  from his diet including Cakes, Sweet Desserts / Pastries, Ice Cream, Soda (diet and regular), Sweet Tea, Candies, Chips, Cookies, Store Bought Juices, Alcohol in Excess of  1-2 drinks a day, Artificial Sweeteners, and "Sugar-free" Products. This will help patient to have stable blood glucose profile and potentially avoid unintended weight gain.  - I encouraged him to switch to  unprocessed or minimally processed complex starch and increased protein intake (animal or plant source), fruits, and vegetables.  - he is advised to stick to a routine  mealtimes to eat 3 meals  a day and avoid unnecessary snacks ( to snack only to correct hypoglycemia).    - I have approached him with the following individualized plan to manage diabetes and patient agrees:   -  He will  continue to require higher dose  basal/bolus insulin to treat diabetes to target.  -I advised him to stick to  abdominal skin insulin injection, ( switch to a sample of Toujeo Max 90 units ( instead of lantus until next visit)  nightly, continue NovoLog  15  units 3 times a day before meals for pre-meal blood glucose above 90 mg/dL plus patient specific correction dose for pre-meal blood glucose above 150 mg/dL, associated with strict monitoring of blood glucose 4 times a day-before meals and at bedtime.  - Patient is warned not to take insulin without proper  monitoring per orders. -Patient is encouraged to call clinic for blood glucose levels less than 70 or above 300 mg /dl. - I will continue metformin 1000 mg by mouth twice a day. - Patient specific target  A1c;  LDL, HDL, Triglycerides, and  Waist Circumference were discussed in detail.  2) BP/HTN: his BP is Controlled. Continue current medications including ACEI/ARB. 3) Lipids/HPL:   Significantly improved lipid panel triglycerides 149 improving from 423. Better control of diabetes  helped control triglycerides.   Patient is advised to continue statins at a lower dose of 40 mg by mouth daily at bedtime, he'll not require Lovaza at this time. 4)  Weight/Diet: CDE Consult  in progress , exercise, and detailed carbohydrates information provided.  5) Chronic Care/Health Maintenance:  -he  is on ACEI/ARB and Statin medications and  is encouraged to continue to follow up with Ophthalmology, Dentist,  Podiatrist at least yearly or according to recommendations, and advised to  stay away from smoking. I have recommended yearly flu vaccine and pneumonia vaccination at least every 5 years; moderate intensity exercise for up to 150  minutes weekly; and  sleep for at least 7 hours a day.  - I advised patient to maintain close follow up with Barbie Haggis, FNP for primary care needs.  - Time spent with the patient: 25 min, of which >50% was spent in reviewing his blood glucose logs , discussing his hypo- and hyper-glycemic episodes, reviewing his current and  previous labs and insulin doses and developing a plan to avoid hypo- and hyper-glycemia. Please refer to Patient Instructions for Blood Glucose Monitoring and Insulin/Medications Dosing Guide"  in media tab for additional information. Rinaldo Ratel Vohra participated in the discussions, expressed understanding, and voiced agreement with the above plans.  All questions were answered to his satisfaction. he is encouraged to contact clinic should he have any questions or concerns prior to his return visit.  Follow up plan: - Return in about 2 weeks (around 12/10/2017) for follow up with meter and logs- no labs.  Marquis Lunch, MD Phone: [phone]  Fax: [phone]   11/26/2017, 1:23 PM   This note was partially dictated with voice recognition software. Similar sounding words can be transcribed inadequately or may not  be corrected upon review.

## 2017-11-26 NOTE — Patient Instructions (Signed)

## 2017-12-11 ENCOUNTER — Ambulatory Visit (INDEPENDENT_AMBULATORY_CARE_PROVIDER_SITE_OTHER): Payer: Medicaid - Out of State | Admitting: "Endocrinology

## 2017-12-11 ENCOUNTER — Encounter: Payer: Self-pay | Admitting: "Endocrinology

## 2017-12-11 VITALS — BP 95/65 | HR 102 | Ht 66.0 in | Wt 206.0 lb

## 2017-12-11 DIAGNOSIS — E782 Mixed hyperlipidemia: Secondary | ICD-10-CM | POA: Diagnosis not present

## 2017-12-11 DIAGNOSIS — E1165 Type 2 diabetes mellitus with hyperglycemia: Secondary | ICD-10-CM | POA: Diagnosis not present

## 2017-12-11 DIAGNOSIS — I10 Essential (primary) hypertension: Secondary | ICD-10-CM | POA: Diagnosis not present

## 2017-12-11 MED ORDER — INSULIN GLARGINE 300 UNIT/ML ~~LOC~~ SOPN: 92.0000 [IU] | PEN_INJECTOR | Freq: Every day | SUBCUTANEOUS | 2 refills | Status: DC

## 2017-12-11 NOTE — Progress Notes (Signed)
Subjective:    Patient ID: Russell Clark, male    DOB: [birth year].  he is being seen in follow-up for management of currently uncontrolled symptomatic diabetes requested by  Barbie Haggis, FNP.   Past Medical History:  Diagnosis Date  . Diabetes mellitus, type II (HCC)   . Hyperlipidemia   . Hypertension    Past Surgical History:  Procedure Laterality Date  . OTHER SURGICAL HISTORY     L foot, Cataracts   Social History   Socioeconomic History  . Marital status: Married    Spouse name: Not on file  . Number of children: Not on file  . Years of education: Not on file  . Highest education level: Not on file  Occupational History  . Not on file  Social Needs  . Financial resource strain: Not on file  . Food insecurity:    Worry: Not on file    Inability: Not on file  . Transportation needs:    Medical: Not on file    Non-medical: Not on file  Tobacco Use  . Smoking status: Former Smoker    Packs/day: 1.00    Years: 25.00    Pack years: 25.00    Types: Cigarettes  . Smokeless tobacco: Never Used  Substance and Sexual Activity  . Alcohol use: No  . Drug use: No  . Sexual activity: Not on file  Lifestyle  . Physical activity:    Days per week: Not on file    Minutes per session: Not on file  . Stress: Not on file  Relationships  . Social connections:    Talks on phone: Not on file    Gets together: Not on file    Attends religious service: Not on file    Active member of club or organization: Not on file    Attends meetings of clubs or organizations: Not on file    Relationship status: Not on file  Other Topics Concern  . Not on file  Social History Narrative  . Not on file   Outpatient Encounter Medications as of 12/11/2017  Medication Sig  . Insulin Glargine (TOUJEO SOLOSTAR) 300 UNIT/ML SOPN Inject 92 Units into the skin at bedtime.  . [DISCONTINUED] Insulin Glargine (TOUJEO SOLOSTAR) 300 UNIT/ML SOPN Inject 88 Units into the skin at bedtime.   Marland Kitchen buPROPion (WELLBUTRIN SR) 150 MG 12 hr tablet Take 150 mg by mouth 2 (two) times daily.  . Cholecalciferol (VITAMIN D PO) Take by mouth.  . Continuous Blood Gluc Sensor (FREESTYLE LIBRE SENSOR SYSTEM) MISC Use one sensor every 10 days.  . insulin aspart (NOVOLOG FLEXPEN) 100 UNIT/ML FlexPen Inject 15-21 Units into the skin 3 (three) times daily with meals.  Marland Kitchen lisinopril (PRINIVIL,ZESTRIL) 2.5 MG tablet Take 2.5 mg by mouth daily.  Marland Kitchen loperamide (IMODIUM A-D) 2 MG tablet Take 12 mg by mouth daily.  Marland Kitchen losartan-hydrochlorothiazide (HYZAAR) 100-12.5 MG tablet Take 1 tablet by mouth daily.  . metFORMIN (GLUCOPHAGE) 1000 MG tablet Take 1,000 mg by mouth 2 (two) times daily with a meal.  . nortriptyline (PAMELOR) 25 MG capsule Take 25 mg by mouth at bedtime.  Marland Kitchen omeprazole (PRILOSEC) 40 MG capsule Take 40 mg by mouth daily.  . pravastatin (PRAVACHOL) 40 MG tablet Take 1 tablet (40 mg total) by mouth daily.  . pregabalin (LYRICA) 200 MG capsule Take 200 mg by mouth 3 (three) times daily.  . [DISCONTINUED] Insulin Glargine (LANTUS SOLOSTAR) 100 UNIT/ML Solostar Pen Inject 80 Units into the skin  at bedtime. (Patient not taking: Reported on 12/11/2017)   No facility-administered encounter medications on file as of 12/11/2017.     ALLERGIES: Allergies  Allergen Reactions  . Aspirin   . Darvon [Propoxyphene]   . Ibuprofen   . Penicillins     VACCINATION STATUS:  There is no immunization history on file for this patient.  Diabetes  He presents for his follow-up diabetic visit. He has type 2 diabetes mellitus. Onset time: He was diagnosed at approximate age of 41 years. His disease course has been improving. There are no hypoglycemic associated symptoms. Pertinent negatives for hypoglycemia include no confusion, headaches, pallor or seizures. Associated symptoms include polydipsia and polyuria. Pertinent negatives for diabetes include no blurred vision, no chest pain, no fatigue, no polyphagia and no  weakness. There are no hypoglycemic complications. Symptoms are improving. Diabetic complications include peripheral neuropathy. Risk factors for coronary artery disease include dyslipidemia, diabetes mellitus, hypertension, male sex, sedentary lifestyle and tobacco exposure. Current diabetic treatment includes insulin injections and oral agent (dual therapy). He is compliant with treatment most of the time. His weight is stable. He is following a generally unhealthy diet. When asked about meal planning, he reported none. He never participates in exercise. His home blood glucose trend is decreasing steadily. His breakfast blood glucose range is generally >200 mg/dl. His lunch blood glucose range is generally 180-200 mg/dl. His dinner blood glucose range is generally 180-200 mg/dl. His bedtime blood glucose range is generally >200 mg/dl. His overall blood glucose range is >200 mg/dl. An ACE inhibitor/angiotensin II receptor blocker is being taken. He sees a podiatrist.Eye exam is current (He is due for his dilated eye exam, promises to do before next visit.).  Hyperlipidemia  This is a chronic problem. The current episode started more than 1 year ago. The problem is uncontrolled. Recent lipid tests were reviewed and are high. Exacerbating diseases include diabetes and obesity. Factors aggravating his hyperlipidemia include smoking. Pertinent negatives include no chest pain, myalgias or shortness of breath. Current antihyperlipidemic treatment includes statins. Risk factors for coronary artery disease include dyslipidemia, diabetes mellitus, a sedentary lifestyle and obesity.  Hypertension  This is a chronic problem. The current episode started more than 1 year ago. The problem is controlled. Pertinent negatives include no blurred vision, chest pain, headaches, neck pain, palpitations or shortness of breath. Risk factors for coronary artery disease include dyslipidemia, diabetes mellitus, male gender, sedentary  lifestyle and smoking/tobacco exposure. Past treatments include angiotensin blockers.    Review of Systems  Constitutional: Negative for chills, fatigue, fever and unexpected weight change.  HENT: Negative for dental problem, mouth sores and trouble swallowing.   Eyes: Negative for blurred vision and visual disturbance.  Respiratory: Negative for cough, choking, chest tightness, shortness of breath and wheezing.   Cardiovascular: Negative for chest pain, palpitations and leg swelling.  Gastrointestinal: Negative for abdominal distention, abdominal pain, constipation, diarrhea, nausea and vomiting.  Endocrine: Positive for polydipsia and polyuria. Negative for polyphagia.  Genitourinary: Negative for dysuria, flank pain, hematuria and urgency.  Musculoskeletal: Negative for back pain, gait problem, myalgias and neck pain.  Skin: Negative for pallor, rash and wound.  Neurological: Negative for seizures, syncope, weakness, numbness and headaches.  Psychiatric/Behavioral: Negative for confusion and dysphoric mood.    Objective:    BP 95/65   Pulse (!) 102   Ht 5\' 6"  (1.676 m)   Wt 206 lb (93.4 kg)   BMI 33.25 kg/m   Wt Readings from Last 3 Encounters:  12/11/17 206 lb (93.4 kg)  11/26/17 206 lb (93.4 kg)  11/10/17 199 lb (90.3 kg)     Physical Exam  Constitutional: He is oriented to person, place, and time. He appears well-developed. He is cooperative. No distress.  HENT:  Head: Normocephalic and atraumatic.  Eyes: EOM are normal.  Neck: Normal range of motion. Neck supple. No tracheal deviation present. No thyromegaly present.  Cardiovascular: Normal rate, S1 normal and S2 normal. Exam reveals no gallop.  No murmur heard. Pulses:      Dorsalis pedis pulses are 1+ on the right side, and 1+ on the left side.       Posterior tibial pulses are 1+ on the right side, and 1+ on the left side.  Pulmonary/Chest: Effort normal. No respiratory distress. He has no wheezes.  Abdominal: He  exhibits no distension. There is no tenderness. There is no guarding and no CVA tenderness.  Musculoskeletal: He exhibits no edema.       Right shoulder: He exhibits no swelling and no deformity.  Neurological: He is alert and oriented to person, place, and time. He has normal strength and normal reflexes. No cranial nerve deficit or sensory deficit. Gait normal.  Skin: Skin is warm and dry. No rash noted. No cyanosis. Nails show no clubbing.  Psychiatric: He has a normal mood and affect. His speech is normal. Cognition and memory are normal.    Recent Results (from the past 2160 hour(s))  Basic metabolic panel     Status: None   Collection Time: 11/03/17 12:00 AM  Result Value Ref Range   BUN 21 4 - 21   Creatinine 1.3 0.6 - 1.3  Hepatic function panel     Status: Abnormal   Collection Time: 11/03/17 12:00 AM  Result Value Ref Range   Alkaline Phosphatase 129 (A) 25 - 125   ALT 40 10 - 40   AST 22 14 - 40  Hemoglobin A1c     Status: None   Collection Time: 11/03/17 12:00 AM  Result Value Ref Range   Hemoglobin A1C 9.6     Assessment & Plan:   1. Uncontrolled type 2 diabetes mellitus with other specified complication, with [place]-term current use of insulin (HCC)  - Patient has currently uncontrolled symptomatic type 2 DM since  52 years of age. - He came with significant improvement in his blood glucose profile on intensive treatment with basal/bolus insulin.   -He did not have any documented or reported hypoglycemia. - His recent  A1c was increasing to 9.6% from 8.6% last visit.  -Recent labs reviewed, showing normal renal function,  significantly improved repeat profile.  -his diabetes is complicated by peripheral neuropathy, sedentary life and Ejay E Reeder remains at a high risk for more acute and chronic complications which include CAD, CVA, CKD, retinopathy, and neuropathy. These are all discussed in detail with the patient.  - I have counseled him on diet management and  weight loss, by adopting a carbohydrate restricted/protein rich diet.  -  Suggestion is made for him to avoid simple carbohydrates  from his diet including Cakes, Sweet Desserts / Pastries, Ice Cream, Soda (diet and regular), Sweet Tea, Candies, Chips, Cookies, Store Bought Juices, Alcohol in Excess of  1-2 drinks a day, Artificial Sweeteners, and "Sugar-free" Products. This will help patient to have stable blood glucose profile and potentially avoid unintended weight gain.   - I encouraged him to switch to  unprocessed or minimally processed complex starch and increased protein  intake (animal or plant source), fruits, and vegetables.  - he is advised to stick to a routine mealtimes to eat 3 meals  a day and avoid unnecessary snacks ( to snack only to correct hypoglycemia).    - I have approached him with the following individualized plan to manage diabetes and patient agrees:   -  He will  continue to require higher dose  basal/bolus insulin to treat diabetes to target.  -I advised him to stick to  abdominal skin insulin injection, he did better on Toujeo than Lantus.  -I advised him to finish his Lantus supplies using 92 units nightly and then switch to Toujeo same dose, continue NovoLog  15  units 3 times a day before meals for pre-meal blood glucose above 90 mg/dL plus patient specific correction dose for pre-meal blood glucose above 150 mg/dL, associated with documenting of his blood glucose profile at least 4 times a day-before meals and at bedtime.   -He is benefiting from continuous glucose monitoring device.  I encouraged him to wear this device at all times.     - Patient is warned not to take insulin without proper monitoring per orders. -Patient is encouraged to call clinic for blood glucose levels less than 70 or above 300 mg /dl. - I will continue metformin 1000 mg by mouth twice a day. - Patient specific target  A1c;  LDL, HDL, Triglycerides, and  Waist Circumference were  discussed in detail.  2) BP/HTN: His blood pressure is controlled to target currently at 95/65.  He is also on Hyzaar and lisinopril .  Proceed to discontinue his lisinopril.   3) Lipids/HPL: His recent lipid panel showed significantly improved lipid panel triglycerides 149 improving from 423. Better control of diabetes  helped control triglycerides.   Patient is advised to continue statins at a lower dose of 40 mg by mouth daily at bedtime, he'll not require Lovaza at this time.   4)  Weight/Diet: CDE Consult  in progress , exercise, and detailed carbohydrates information provided.  5) Chronic Care/Health Maintenance:  -he  is on ACEI/ARB and Statin medications and  is encouraged to continue to follow up with Ophthalmology (he is significantly overdue), Dentist,  Podiatrist at least yearly or according to recommendations, and advised to  stay away from smoking. I have recommended yearly flu vaccine and pneumonia vaccination at least every 5 years; moderate intensity exercise for up to 150 minutes weekly; and  sleep for at least 7 hours a day.  - I advised patient to maintain close follow up with Barbie Haggis, FNP for primary care needs.   - Time spent with the patient: 25 min, of which >50% was spent in reviewing his blood glucose logs , discussing his hypo- and hyper-glycemic episodes, reviewing his current and  previous labs and insulin doses and developing a plan to avoid hypo- and hyper-glycemia. Please refer to Patient Instructions for Blood Glucose Monitoring and Insulin/Medications Dosing Guide"  in media tab for additional information. Rinaldo Ratel Deats participated in the discussions, expressed understanding, and voiced agreement with the above plans.  All questions were answered to his satisfaction. he is encouraged to contact clinic should he have any questions or concerns prior to his return visit.   Follow up plan: - Return in about 2 months (around 02/12/2018) for meter, and  logs.  Marquis Lunch, MD Phone: [phone]  Fax: [phone]   12/11/2017, 12:32 PM   This note was partially dictated with voice recognition software. Similar  sounding words can be transcribed inadequately or may not  be corrected upon review.

## 2017-12-11 NOTE — Patient Instructions (Signed)

## 2017-12-16 LAB — HM DIABETES EYE EXAM

## 2017-12-24 ENCOUNTER — Other Ambulatory Visit: Payer: Self-pay | Admitting: "Endocrinology

## 2017-12-24 MED ORDER — INSULIN DEGLUDEC 200 UNIT/ML ~~LOC~~ SOPN: 92.0000 [IU] | PEN_INJECTOR | Freq: Every day | SUBCUTANEOUS | 2 refills | Status: DC

## 2018-01-26 LAB — LIPID PANEL
CHOLESTEROL: 102 (ref 0–200)
HDL: 23 — AB (ref 35–70)
LDL Cholesterol: 13
TRIGLYCERIDES: 328 — AB (ref 40–160)

## 2018-01-26 LAB — HEMOGLOBIN A1C: Hemoglobin A1C: 9.4

## 2018-01-26 LAB — BASIC METABOLIC PANEL
BUN: 23 — AB (ref 4–21)
Creatinine: 1.3 (ref <=1.3)

## 2018-02-03 ENCOUNTER — Other Ambulatory Visit: Payer: Self-pay | Admitting: "Endocrinology

## 2018-02-04 ENCOUNTER — Other Ambulatory Visit: Payer: Self-pay | Admitting: "Endocrinology

## 2018-02-09 NOTE — Telephone Encounter (Signed)
Follow up   *STAT* If patient is at the pharmacy, call can be transferred to refill team.   1. Which medications need to be refilled? (please list name of each medication and dose if known)  continous blood gluc sensor insulin aspart use sensor every 10 days  [facility] drug which he stated he was put on the insurance wont cover it and wondering if we can send something else instead.  2. Which pharmacy/location (including street and city if local pharmacy) is medication to be sent to? [place], [place]  3. Do they need a 30 day or 90 day supply?  30 days supply

## 2018-02-10 ENCOUNTER — Other Ambulatory Visit: Payer: Self-pay

## 2018-02-10 MED ORDER — INSULIN DEGLUDEC 200 UNIT/ML ~~LOC~~ SOPN: 92.0000 [IU] | PEN_INJECTOR | Freq: Every day | SUBCUTANEOUS | 2 refills | Status: DC

## 2018-02-10 MED ORDER — FREESTYLE LIBRE 14 DAY SENSOR MISC: 1.0000 | 2 refills | Status: DC

## 2018-02-15 ENCOUNTER — Ambulatory Visit (INDEPENDENT_AMBULATORY_CARE_PROVIDER_SITE_OTHER): Payer: Medicaid - Out of State | Admitting: "Endocrinology

## 2018-02-15 ENCOUNTER — Encounter: Payer: Self-pay | Admitting: "Endocrinology

## 2018-02-15 ENCOUNTER — Other Ambulatory Visit: Payer: Self-pay | Admitting: "Endocrinology

## 2018-02-15 VITALS — BP 120/78 | HR 89 | Ht 66.0 in | Wt 213.0 lb

## 2018-02-15 DIAGNOSIS — E1165 Type 2 diabetes mellitus with hyperglycemia: Secondary | ICD-10-CM

## 2018-02-15 DIAGNOSIS — E6609 Other obesity due to excess calories: Secondary | ICD-10-CM

## 2018-02-15 DIAGNOSIS — E782 Mixed hyperlipidemia: Secondary | ICD-10-CM | POA: Diagnosis not present

## 2018-02-15 DIAGNOSIS — Z6832 Body mass index (BMI) 32.0-32.9, adult: Secondary | ICD-10-CM

## 2018-02-15 DIAGNOSIS — I10 Essential (primary) hypertension: Secondary | ICD-10-CM

## 2018-02-15 MED ORDER — INSULIN ASPART 100 UNIT/ML FLEXPEN: 20.0000 [IU] | PEN_INJECTOR | Freq: Three times a day (TID) | SUBCUTANEOUS | 2 refills | Status: DC

## 2018-02-15 MED ORDER — INSULIN GLARGINE 100 UNIT/ML SOLOSTAR PEN: 60.0000 [IU] | PEN_INJECTOR | Freq: Two times a day (BID) | SUBCUTANEOUS | 2 refills | Status: DC

## 2018-02-15 NOTE — Progress Notes (Signed)
Subjective:    Patient ID: Russell Clark, male    DOB: 1966/04/18.  he is being seen in follow-up for management of currently uncontrolled symptomatic diabetes requested by  Barbie Haggis, FNP.   Past Medical History:  Diagnosis Date  . Diabetes mellitus, type II (HCC)   . Hyperlipidemia   . Hypertension    Past Surgical History:  Procedure Laterality Date  . OTHER SURGICAL HISTORY     L foot, Cataracts   Social History   Socioeconomic History  . Marital status: Married    Spouse name: Not on file  . Number of children: Not on file  . Years of education: Not on file  . Highest education level: Not on file  Occupational History  . Not on file  Social Needs  . Financial resource strain: Not on file  . Food insecurity:    Worry: Not on file    Inability: Not on file  . Transportation needs:    Medical: Not on file    Non-medical: Not on file  Tobacco Use  . Smoking status: Former Smoker    Packs/day: 1.00    Years: 25.00    Pack years: 25.00    Types: Cigarettes  . Smokeless tobacco: Never Used  Substance and Sexual Activity  . Alcohol use: No  . Drug use: No  . Sexual activity: Not on file  Lifestyle  . Physical activity:    Days per week: Not on file    Minutes per session: Not on file  . Stress: Not on file  Relationships  . Social connections:    Talks on phone: Not on file    Gets together: Not on file    Attends religious service: Not on file    Active member of club or organization: Not on file    Attends meetings of clubs or organizations: Not on file    Relationship status: Not on file  Other Topics Concern  . Not on file  Social History Narrative  . Not on file   Outpatient Encounter Medications as of 02/15/2018  Medication Sig  . Insulin Glargine (LANTUS SOLOSTAR) 100 UNIT/ML Solostar Pen Inject 60 Units into the skin 2 (two) times daily at 8 am and 10 pm.  . [DISCONTINUED] Insulin Glargine (LANTUS SOLOSTAR) 100 UNIT/ML Solostar Pen  Inject 60 Units into the skin 2 (two) times daily at 8 am and 10 pm.  . buPROPion (WELLBUTRIN SR) 150 MG 12 hr tablet Take 150 mg by mouth 2 (two) times daily.  . Cholecalciferol (VITAMIN D PO) Take by mouth.  . Continuous Blood Gluc Sensor (FREESTYLE LIBRE 14 DAY SENSOR) MISC 1 each by Other route every 14 (fourteen) days.  . insulin aspart (NOVOLOG FLEXPEN) 100 UNIT/ML FlexPen Inject 20-26 Units into the skin 3 (three) times daily with meals.  Marland Kitchen loperamide (IMODIUM A-D) 2 MG tablet Take 12 mg by mouth daily.  Marland Kitchen losartan-hydrochlorothiazide (HYZAAR) 100-12.5 MG tablet Take 1 tablet by mouth daily.  . metFORMIN (GLUCOPHAGE) 1000 MG tablet Take 1,000 mg by mouth 2 (two) times daily with a meal.  . nortriptyline (PAMELOR) 25 MG capsule Take 25 mg by mouth at bedtime.  Marland Kitchen omeprazole (PRILOSEC) 40 MG capsule Take 40 mg by mouth daily.  . pravastatin (PRAVACHOL) 40 MG tablet Take 1 tablet (40 mg total) by mouth daily.  . pregabalin (LYRICA) 200 MG capsule Take 200 mg by mouth 3 (three) times daily.  . [DISCONTINUED] insulin aspart (NOVOLOG FLEXPEN) 100  UNIT/ML FlexPen Inject 15-21 Units into the skin 3 (three) times daily with meals.  . [DISCONTINUED] Insulin Degludec (TRESIBA FLEXTOUCH) 200 UNIT/ML SOPN Inject 92 Units into the skin at bedtime.   No facility-administered encounter medications on file as of 02/15/2018.     ALLERGIES: Allergies  Allergen Reactions  . Aspirin   . Darvon [Propoxyphene]   . Ibuprofen   . Penicillins     VACCINATION STATUS:  There is no immunization history on file for this patient.  Diabetes  He presents for his follow-up diabetic visit. He has type 2 diabetes mellitus. Onset time: He was diagnosed at approximate age of 41 years. His disease course has been worsening. There are no hypoglycemic associated symptoms. Pertinent negatives for hypoglycemia include no confusion, headaches, pallor or seizures. Associated symptoms include polydipsia and polyuria.  Pertinent negatives for diabetes include no blurred vision, no chest pain, no fatigue, no polyphagia and no weakness. There are no hypoglycemic complications. Symptoms are worsening. Diabetic complications include peripheral neuropathy. Risk factors for coronary artery disease include dyslipidemia, diabetes mellitus, hypertension, male sex, sedentary lifestyle and tobacco exposure. Current diabetic treatment includes insulin injections and oral agent (dual therapy). He is compliant with treatment most of the time. His weight is increasing steadily. He is following a generally unhealthy diet. When asked about meal planning, he reported none. He never participates in exercise. There is no change in his home blood glucose trend. His breakfast blood glucose range is generally >200 mg/dl. His lunch blood glucose range is generally >200 mg/dl. His dinner blood glucose range is generally >200 mg/dl. His bedtime blood glucose range is generally >200 mg/dl. His overall blood glucose range is >200 mg/dl. An ACE inhibitor/angiotensin II receptor blocker is being taken. He sees a podiatrist.Eye exam is current (He is due for his dilated eye exam, promises to do before next visit.).  Hyperlipidemia  This is a chronic problem. The current episode started more than 1 year ago. The problem is uncontrolled. Recent lipid tests were reviewed and are high. Exacerbating diseases include diabetes and obesity. Factors aggravating his hyperlipidemia include smoking. Pertinent negatives include no chest pain, myalgias or shortness of breath. Current antihyperlipidemic treatment includes statins. Risk factors for coronary artery disease include dyslipidemia, diabetes mellitus, a sedentary lifestyle and obesity.  Hypertension  This is a chronic problem. The current episode started more than 1 year ago. The problem is controlled. Pertinent negatives include no blurred vision, chest pain, headaches, neck pain, palpitations or shortness of  breath. Risk factors for coronary artery disease include dyslipidemia, diabetes mellitus, male gender, sedentary lifestyle and smoking/tobacco exposure. Past treatments include angiotensin blockers.    Review of Systems  Constitutional: Negative for chills, fatigue, fever and unexpected weight change.  HENT: Negative for dental problem, mouth sores and trouble swallowing.   Eyes: Negative for blurred vision and visual disturbance.  Respiratory: Negative for cough, choking, chest tightness, shortness of breath and wheezing.   Cardiovascular: Negative for chest pain, palpitations and leg swelling.  Gastrointestinal: Negative for abdominal distention, abdominal pain, constipation, diarrhea, nausea and vomiting.  Endocrine: Positive for polydipsia and polyuria. Negative for polyphagia.  Genitourinary: Negative for dysuria, flank pain, hematuria and urgency.  Musculoskeletal: Negative for back pain, gait problem, myalgias and neck pain.  Skin: Negative for pallor, rash and wound.  Neurological: Negative for seizures, syncope, weakness, numbness and headaches.  Psychiatric/Behavioral: Negative for confusion and dysphoric mood.    Objective:    BP 120/78   Pulse 89  Ht 5\' 6"  (1.676 m)   Wt 213 lb (96.6 kg)   BMI 34.38 kg/m   Wt Readings from Last 3 Encounters:  02/15/18 213 lb (96.6 kg)  12/11/17 206 lb (93.4 kg)  11/26/17 206 lb (93.4 kg)     Physical Exam  Constitutional: He is oriented to person, place, and time. He appears well-developed. He is cooperative. No distress.  HENT:  Head: Normocephalic and atraumatic.  Eyes: EOM are normal.  Neck: Normal range of motion. Neck supple. No tracheal deviation present. No thyromegaly present.  Cardiovascular: Normal rate, S1 normal and S2 normal. Exam reveals no gallop.  No murmur heard. Pulses:      Dorsalis pedis pulses are 0 on the right side, and 0 on the left side.       Posterior tibial pulses are 0 on the right side, and 0 on the  left side.  Pulmonary/Chest: Effort normal. No respiratory distress. He has no wheezes.  Abdominal: He exhibits no distension. There is no tenderness. There is no guarding and no CVA tenderness.  Musculoskeletal: He exhibits no edema.       Right shoulder: He exhibits no swelling and no deformity.  Neurological: He is alert and oriented to person, place, and time. He has normal strength and normal reflexes. No cranial nerve deficit or sensory deficit. Gait normal.  Skin: Skin is warm and dry. No rash noted. No cyanosis. Nails show no clubbing.  Psychiatric: He has a normal mood and affect. His speech is normal. Cognition and memory are normal.    Recent Results (from the past 2160 hour(s))  [facility] DIABETES EYE EXAM     Status: None   Collection Time: 12/16/17 11:51 AM  Result Value Ref Range   [facility] Diabetic Eye Exam  No Retinopathy  Basic metabolic panel     Status: Abnormal   Collection Time: 01/26/18 12:00 AM  Result Value Ref Range   BUN 23 (A) 4 - 21   Creatinine 1.3 0.6 - 1.3  Lipid panel     Status: Abnormal   Collection Time: 01/26/18 12:00 AM  Result Value Ref Range   Triglycerides 328 (A) 40 - 160   Cholesterol 102 0 - 200   HDL 23 (A) 35 - 70   LDL Cholesterol 13   Hemoglobin A1c     Status: None   Collection Time: 01/26/18 12:00 AM  Result Value Ref Range   Hemoglobin A1C 9.4     Assessment & Plan:   1. Uncontrolled type 2 diabetes mellitus with other specified complication, with [place]-term current use of insulin (HCC)  - Patient has currently uncontrolled symptomatic type 2 DM since  52 years of age. - He came with significantly above target blood glucose profile despite large dose of insulin -His A1c is still significantly high at 9.4%.   -Recent labs reviewed, showing normal renal function,.  -his diabetes is complicated by peripheral neuropathy, peripheral arterial disease, sedentary life and Russell Clark remains at a high risk for more acute and chronic  complications which include CAD, CVA, CKD, retinopathy, and neuropathy. These are all discussed in detail with the patient.  - I have counseled him on diet management and weight loss, by adopting a carbohydrate restricted/protein rich diet.  -  Suggestion is made for him to avoid simple carbohydrates  from his diet including Cakes, Sweet Desserts / Pastries, Ice Cream, Soda (diet and regular), Sweet Tea, Candies, Chips, Cookies, Store Bought Juices, Alcohol in Excess of  1-2 drinks a day, Artificial Sweeteners, and "Sugar-free" Products. This will help patient to have stable blood glucose profile and potentially avoid unintended weight gain.  - I encouraged him to switch to  unprocessed or minimally processed complex starch and increased protein intake (animal or plant source), fruits, and vegetables.  - he is advised to stick to a routine mealtimes to eat 3 meals  a day and avoid unnecessary snacks ( to snack only to correct hypoglycemia).    - I have approached him with the following individualized plan to manage diabetes and patient agrees:   -  He will  continue to require higher dose  basal/bolus insulin to treat diabetes to target.  -His insurance did not provide coverage for [place] or [place].    -I advised him to increase his Lantus to 60 units twice daily, increase his NovoLog to 20  units 3 times a day before meals for pre-meal blood glucose above 90 mg/dL plus patient specific correction dose for pre-meal blood glucose above 150 mg/dL, associated with documenting of his blood glucose profile at least 4 times a day-before meals and at bedtime.   -He is benefiting from continuous glucose monitoring device.  I encouraged him to wear this device at all times.     - Patient is warned not to take insulin without proper monitoring per orders. -Patient is encouraged to call clinic for blood glucose levels less than 70 or above 300 mg /dl. - I will continue metformin 1000 mg by mouth twice a  day. - Patient specific target  A1c;  LDL, HDL, Triglycerides, and  Waist Circumference were discussed in detail.  2) BP/HTN: His blood pressure is controlled to target at 120/78.    He is advised to continue Hyzaar.    3) Lipids/HPL: His recent lipid panel showed significantly improved lipid panel triglycerides 149 improving from 423. Better control of diabetes  helped control triglycerides.   Patient is advised to continue statins at a lower dose of 40 mg by mouth daily at bedtime, he'll not require Lovaza at this time.   4)  Weight/Diet: CDE Consult  in progress , exercise, and detailed carbohydrates information provided.  5) Chronic Care/Health Maintenance:  -he  is on ACEI/ARB and Statin medications and  is encouraged to continue to follow up with Ophthalmology (he is significantly overdue), Dentist,  Podiatrist at least yearly or according to recommendations, and advised to  stay away from smoking. I have recommended yearly flu vaccine and pneumonia vaccination at least every 5 years; moderate intensity exercise for up to 150 minutes weekly; and  sleep for at least 7 hours a day.  - I advised patient to maintain close follow up with Barbie Haggis, FNP for primary care needs.   - Time spent with the patient: 25 min, of which >50% was spent in reviewing his blood glucose logs , discussing his hypo- and hyper-glycemic episodes, reviewing his current and  previous labs and insulin doses and developing a plan to avoid hypo- and hyper-glycemia. Please refer to Patient Instructions for Blood Glucose Monitoring and Insulin/Medications Dosing Guide"  in media tab for additional information. Rinaldo Ratel Franchini participated in the discussions, expressed understanding, and voiced agreement with the above plans.  All questions were answered to his satisfaction. he is encouraged to contact clinic should he have any questions or concerns prior to his return visit.   Follow up plan: - Return in about 2  weeks (around 03/01/2018) for follow up with meter  and logs- no labs.  Marquis Lunch, MD Phone: [phone]  Fax: [phone]   02/15/2018, 12:06 PM   This note was partially dictated with voice recognition software. Similar sounding words can be transcribed inadequately or may not  be corrected upon review.

## 2018-02-15 NOTE — Patient Instructions (Signed)

## 2018-03-02 ENCOUNTER — Encounter: Payer: Self-pay | Admitting: "Endocrinology

## 2018-03-02 ENCOUNTER — Ambulatory Visit (INDEPENDENT_AMBULATORY_CARE_PROVIDER_SITE_OTHER): Payer: Medicaid - Out of State | Admitting: "Endocrinology

## 2018-03-02 VITALS — BP 126/78 | HR 90 | Ht 66.0 in | Wt 215.0 lb

## 2018-03-02 DIAGNOSIS — E1165 Type 2 diabetes mellitus with hyperglycemia: Secondary | ICD-10-CM | POA: Diagnosis not present

## 2018-03-02 DIAGNOSIS — E782 Mixed hyperlipidemia: Secondary | ICD-10-CM | POA: Diagnosis not present

## 2018-03-02 DIAGNOSIS — I10 Essential (primary) hypertension: Secondary | ICD-10-CM

## 2018-03-02 MED ORDER — INSULIN GLARGINE 100 UNIT/ML SOLOSTAR PEN: 80.0000 [IU] | PEN_INJECTOR | Freq: Two times a day (BID) | SUBCUTANEOUS | 2 refills | Status: DC

## 2018-03-02 MED ORDER — INSULIN ASPART 100 UNIT/ML FLEXPEN: 25.0000 [IU] | PEN_INJECTOR | Freq: Three times a day (TID) | SUBCUTANEOUS | 2 refills | Status: DC

## 2018-03-02 NOTE — Patient Instructions (Signed)

## 2018-03-02 NOTE — Progress Notes (Signed)
Subjective:    Patient ID: Russell Clark, male    DOB: Dec 31, 1965.  he is being seen in follow-up for management of currently uncontrolled symptomatic diabetes requested by  Barbie Haggis, FNP.   Past Medical History:  Diagnosis Date  . Diabetes mellitus, type II (HCC)   . Hyperlipidemia   . Hypertension    Past Surgical History:  Procedure Laterality Date  . OTHER SURGICAL HISTORY     L foot, Cataracts   Social History   Socioeconomic History  . Marital status: Married    Spouse name: Not on file  . Number of children: Not on file  . Years of education: Not on file  . Highest education level: Not on file  Occupational History  . Not on file  Social Needs  . Financial resource strain: Not on file  . Food insecurity:    Worry: Not on file    Inability: Not on file  . Transportation needs:    Medical: Not on file    Non-medical: Not on file  Tobacco Use  . Smoking status: Former Smoker    Packs/day: 1.00    Years: 25.00    Pack years: 25.00    Types: Cigarettes  . Smokeless tobacco: Never Used  Substance and Sexual Activity  . Alcohol use: No  . Drug use: No  . Sexual activity: Not on file  Lifestyle  . Physical activity:    Days per week: Not on file    Minutes per session: Not on file  . Stress: Not on file  Relationships  . Social connections:    Talks on phone: Not on file    Gets together: Not on file    Attends religious service: Not on file    Active member of club or organization: Not on file    Attends meetings of clubs or organizations: Not on file    Relationship status: Not on file  Other Topics Concern  . Not on file  Social History Narrative  . Not on file   Outpatient Encounter Medications as of 03/02/2018  Medication Sig  . buPROPion (WELLBUTRIN SR) 150 MG 12 hr tablet Take 150 mg by mouth 2 (two) times daily.  . Cholecalciferol (VITAMIN D PO) Take by mouth.  . Continuous Blood Gluc Sensor (FREESTYLE LIBRE 14 DAY SENSOR) MISC  1 each by Other route every 14 (fourteen) days.  . insulin aspart (NOVOLOG FLEXPEN) 100 UNIT/ML FlexPen Inject 25-31 Units into the skin 3 (three) times daily with meals.  . Insulin Glargine (LANTUS SOLOSTAR) 100 UNIT/ML Solostar Pen Inject 80 Units into the skin 2 (two) times daily at 8 am and 10 pm.  . loperamide (IMODIUM A-D) 2 MG tablet Take 12 mg by mouth daily.  Marland Kitchen losartan-hydrochlorothiazide (HYZAAR) 100-12.5 MG tablet Take 1 tablet by mouth daily.  . metFORMIN (GLUCOPHAGE) 1000 MG tablet Take 1,000 mg by mouth 2 (two) times daily with a meal.  . nortriptyline (PAMELOR) 25 MG capsule Take 25 mg by mouth at bedtime.  Marland Kitchen omeprazole (PRILOSEC) 40 MG capsule Take 40 mg by mouth daily.  . pravastatin (PRAVACHOL) 40 MG tablet Take 1 tablet (40 mg total) by mouth daily.  . pregabalin (LYRICA) 200 MG capsule Take 200 mg by mouth 3 (three) times daily.  . [DISCONTINUED] insulin aspart (NOVOLOG FLEXPEN) 100 UNIT/ML FlexPen Inject 20-26 Units into the skin 3 (three) times daily with meals.  . [DISCONTINUED] insulin aspart (NOVOLOG FLEXPEN) 100 UNIT/ML FlexPen Inject 20-26 Units  into the skin 3 (three) times daily with meals.  . [DISCONTINUED] Insulin Glargine (LANTUS SOLOSTAR) 100 UNIT/ML Solostar Pen Inject 60 Units into the skin 2 (two) times daily at 8 am and 10 pm.   No facility-administered encounter medications on file as of 03/02/2018.     ALLERGIES: Allergies  Allergen Reactions  . Aspirin   . Darvon [Propoxyphene]   . Ibuprofen   . Penicillins     VACCINATION STATUS:  There is no immunization history on file for this patient.  Diabetes  He presents for his follow-up diabetic visit. He has type 2 diabetes mellitus. Onset time: He was diagnosed at approximate age of 41 years. His disease course has been worsening. There are no hypoglycemic associated symptoms. Pertinent negatives for hypoglycemia include no confusion, headaches, pallor or seizures. Associated symptoms include  polydipsia and polyuria. Pertinent negatives for diabetes include no blurred vision, no chest pain, no fatigue, no polyphagia and no weakness. There are no hypoglycemic complications. Symptoms are worsening. Diabetic complications include peripheral neuropathy. Risk factors for coronary artery disease include dyslipidemia, diabetes mellitus, hypertension, male sex, sedentary lifestyle and tobacco exposure. Current diabetic treatment includes insulin injections and oral agent (dual therapy). He is compliant with treatment most of the time. His weight is increasing steadily. He is following a generally unhealthy diet. When asked about meal planning, he reported none. He never participates in exercise. There is no change in his home blood glucose trend. His breakfast blood glucose range is generally >200 mg/dl. His lunch blood glucose range is generally >200 mg/dl. His dinner blood glucose range is generally >200 mg/dl. His bedtime blood glucose range is generally >200 mg/dl. His overall blood glucose range is >200 mg/dl. (His CGM analysis shows persistently above target blood glucose profile averaging 299.  94% above range, 6% time in range.  This agrees with his recent high A1c of 9.4%.) An ACE inhibitor/angiotensin II receptor blocker is being taken. He sees a podiatrist.Eye exam is current (He is due for his dilated eye exam, promises to do before next visit.).  Hyperlipidemia  This is a chronic problem. The current episode started more than 1 year ago. The problem is uncontrolled. Recent lipid tests were reviewed and are high. Exacerbating diseases include diabetes and obesity. Factors aggravating his hyperlipidemia include smoking. Pertinent negatives include no chest pain, myalgias or shortness of breath. Current antihyperlipidemic treatment includes statins. Risk factors for coronary artery disease include dyslipidemia, diabetes mellitus, a sedentary lifestyle and obesity.  Hypertension  This is a chronic  problem. The current episode started more than 1 year ago. The problem is controlled. Pertinent negatives include no blurred vision, chest pain, headaches, neck pain, palpitations or shortness of breath. Risk factors for coronary artery disease include dyslipidemia, diabetes mellitus, male gender, sedentary lifestyle and smoking/tobacco exposure. Past treatments include angiotensin blockers.    Review of Systems  Constitutional: Negative for chills, fatigue, fever and unexpected weight change.  HENT: Negative for dental problem, mouth sores and trouble swallowing.   Eyes: Negative for blurred vision and visual disturbance.  Respiratory: Negative for cough, choking, chest tightness, shortness of breath and wheezing.   Cardiovascular: Negative for chest pain, palpitations and leg swelling.  Gastrointestinal: Negative for abdominal distention, abdominal pain, constipation, diarrhea, nausea and vomiting.  Endocrine: Positive for polydipsia and polyuria. Negative for polyphagia.  Genitourinary: Negative for dysuria, flank pain, hematuria and urgency.  Musculoskeletal: Negative for back pain, gait problem, myalgias and neck pain.  Skin: Negative for pallor, rash and  wound.  Neurological: Negative for seizures, syncope, weakness, numbness and headaches.  Psychiatric/Behavioral: Negative for confusion and dysphoric mood.    Objective:    BP 126/78   Pulse 90   Ht 5\' 6"  (1.676 m)   Wt 215 lb (97.5 kg)   BMI 34.70 kg/m   Wt Readings from Last 3 Encounters:  03/02/18 215 lb (97.5 kg)  02/15/18 213 lb (96.6 kg)  12/11/17 206 lb (93.4 kg)     Physical Exam  Constitutional: He is oriented to person, place, and time. He appears well-developed. He is cooperative. No distress.  HENT:  Head: Normocephalic and atraumatic.  Eyes: EOM are normal.  Neck: Normal range of motion. Neck supple. No tracheal deviation present. No thyromegaly present.  Cardiovascular: Normal rate, S1 normal and S2 normal.  Exam reveals no gallop.  No murmur heard. Pulses:      Dorsalis pedis pulses are 0 on the right side, and 0 on the left side.       Posterior tibial pulses are 0 on the right side, and 0 on the left side.  Pulmonary/Chest: Effort normal. No respiratory distress. He has no wheezes.  Abdominal: He exhibits no distension. There is no tenderness. There is no guarding and no CVA tenderness.  Musculoskeletal: He exhibits no edema.       Right shoulder: He exhibits no swelling and no deformity.  Neurological: He is alert and oriented to person, place, and time. He has normal strength and normal reflexes. No cranial nerve deficit or sensory deficit. Gait normal.  Skin: Skin is warm and dry. No rash noted. No cyanosis. Nails show no clubbing.  Psychiatric: He has a normal mood and affect. His speech is normal. Cognition and memory are normal.    Recent Results (from the past 2160 hour(s))  [facility] DIABETES EYE EXAM     Status: None   Collection Time: 12/16/17 11:51 AM  Result Value Ref Range   [facility] Diabetic Eye Exam  No Retinopathy  Basic metabolic panel     Status: Abnormal   Collection Time: 01/26/18 12:00 AM  Result Value Ref Range   BUN 23 (A) 4 - 21   Creatinine 1.3 0.6 - 1.3  Lipid panel     Status: Abnormal   Collection Time: 01/26/18 12:00 AM  Result Value Ref Range   Triglycerides 328 (A) 40 - 160   Cholesterol 102 0 - 200   HDL 23 (A) 35 - 70   LDL Cholesterol 13   Hemoglobin A1c     Status: None   Collection Time: 01/26/18 12:00 AM  Result Value Ref Range   Hemoglobin A1C 9.4     Assessment & Plan:   1. Uncontrolled type 2 diabetes mellitus with other specified complication, with [place]-term current use of insulin (HCC)  - Patient has currently uncontrolled symptomatic type 2 DM since  52 years of age. - He came with still significantly above target blood glucose profile despite large dose of insulin -His recent A1c has been significantly high at 9.4%.   -Recent labs reviewed,  showing normal renal function,.  -his diabetes is complicated by peripheral neuropathy, peripheral arterial disease, sedentary life and Royston E Dysert remains at a high risk for more acute and chronic complications which include CAD, CVA, CKD, retinopathy, and neuropathy. These are all discussed in detail with the patient.  - I have counseled him on diet management and weight loss, by adopting a carbohydrate restricted/protein rich diet.  -  Suggestion  is made for him to avoid simple carbohydrates  from his diet including Cakes, Sweet Desserts / Pastries, Ice Cream, Soda (diet and regular), Sweet Tea, Candies, Chips, Cookies, Store Bought Juices, Alcohol in Excess of  1-2 drinks a day, Artificial Sweeteners, and "Sugar-free" Products. This will help patient to have stable blood glucose profile and potentially avoid unintended weight gain.   - I encouraged him to switch to  unprocessed or minimally processed complex starch and increased protein intake (animal or plant source), fruits, and vegetables.  - he is advised to stick to a routine mealtimes to eat 3 meals  a day and avoid unnecessary snacks ( to snack only to correct hypoglycemia).    - I have approached him with the following individualized plan to manage diabetes and patient agrees:   -CGM analysis shows hyperglycemia 94% of the time, time ranging 6%.  -He is reeducated on insulin injection technique to rotate the injection site on his abdomen.   -He will continue to require higher dose of insulin.  -His insurance did not provide coverage for [place] or [place].    -I advised him to increase Lantus to 80 units twice daily, increase his NovoLog to 25  units 3 times a day before meals for pre-meal blood glucose above 90 mg/dL plus patient specific correction dose for pre-meal blood glucose above 150 mg/dL, associated with documenting of his blood glucose profile at least 4 times a day-before meals and at bedtime.   -He is benefiting from  continuous glucose monitoring device.  I encouraged him to wear this device at all times.     - Patient is warned not to take insulin without proper monitoring per orders. -Patient is encouraged to call clinic for blood glucose levels less than 70 or above 300 mg /dl. - I will continue metformin 1000 mg by mouth twice a day. - Patient specific target  A1c;  LDL, HDL, Triglycerides, and  Waist Circumference were discussed in detail.  2) BP/HTN: His blood pressure is controlled to target.    He is advised to continue Hyzaar.    3) Lipids/HPL: His recent lipid panel showed significantly improved lipid panel triglycerides 149 improving from 423. Better control of diabetes  helped control triglycerides.   Patient is advised to continue statins at a lower dose of 40 mg by mouth daily at bedtime, he'll not require Lovaza at this time.   4)  Weight/Diet: CDE Consult  in progress , exercise, and detailed carbohydrates information provided.  5) Chronic Care/Health Maintenance:  -he  is on ACEI/ARB and Statin medications and  is encouraged to continue to follow up with Ophthalmology (he is significantly overdue), Dentist,  Podiatrist at least yearly or according to recommendations, and advised to  stay away from smoking. I have recommended yearly flu vaccine and pneumonia vaccination at least every 5 years; moderate intensity exercise for up to 150 minutes weekly; and  sleep for at least 7 hours a day.  - I advised patient to maintain close follow up with Barbie Haggis, FNP for primary care needs.  - Time spent with the patient: 25 min, of which >50% was spent in reviewing his blood glucose logs , discussing his hypo- and hyper-glycemic episodes, reviewing his current and  previous labs and insulin doses and developing a plan to avoid hypo- and hyper-glycemia. Please refer to Patient Instructions for Blood Glucose Monitoring and Insulin/Medications Dosing Guide"  in media tab for additional  information. Rinaldo Ratel Rajagopalan participated in the  discussions, expressed understanding, and voiced agreement with the above plans.  All questions were answered to his satisfaction. he is encouraged to contact clinic should he have any questions or concerns prior to his return visit.   Follow up plan: - Return in about 2 months (around 05/04/2018) for follow up with pre-visit labs, meter, and logs.  Marquis LunchGebre Nida, MD Phone: [phone]  Fax: [phone]   03/02/2018, 4:23 PM   This note was partially dictated with voice recognition software. Similar sounding words can be transcribed inadequately or may not  be corrected upon review.

## 2018-04-28 LAB — BASIC METABOLIC PANEL
BUN: 26 — AB (ref 4–21)
BUN: 26 — AB (ref 4–21)
CREATININE: 1.4 — AB (ref 0.6–1.3)
Creatinine: 1.4 — AB (ref 0.6–1.3)

## 2018-04-28 LAB — HEMOGLOBIN A1C: HEMOGLOBIN A1C: 8

## 2018-05-04 ENCOUNTER — Ambulatory Visit (INDEPENDENT_AMBULATORY_CARE_PROVIDER_SITE_OTHER): Payer: Medicaid - Out of State | Admitting: "Endocrinology

## 2018-05-04 ENCOUNTER — Encounter: Payer: Self-pay | Admitting: "Endocrinology

## 2018-05-04 VITALS — BP 106/77 | HR 94 | Ht 66.0 in | Wt 217.0 lb

## 2018-05-04 DIAGNOSIS — E1165 Type 2 diabetes mellitus with hyperglycemia: Secondary | ICD-10-CM

## 2018-05-04 DIAGNOSIS — I10 Essential (primary) hypertension: Secondary | ICD-10-CM

## 2018-05-04 DIAGNOSIS — E782 Mixed hyperlipidemia: Secondary | ICD-10-CM

## 2018-05-04 DIAGNOSIS — Z6832 Body mass index (BMI) 32.0-32.9, adult: Secondary | ICD-10-CM

## 2018-05-04 DIAGNOSIS — E6609 Other obesity due to excess calories: Secondary | ICD-10-CM

## 2018-05-04 MED ORDER — FREESTYLE LIBRE 14 DAY SENSOR MISC: 1.0000 | 2 refills | Status: DC

## 2018-05-04 MED ORDER — METFORMIN HCL 500 MG PO TABS: 500.0000 mg | ORAL_TABLET | Freq: Two times a day (BID) | ORAL | 6 refills | Status: DC

## 2018-05-04 MED ORDER — INSULIN REGULAR HUMAN (CONC) 500 UNIT/ML ~~LOC~~ SOPN: 60.0000 [IU] | PEN_INJECTOR | Freq: Three times a day (TID) | SUBCUTANEOUS | 2 refills | Status: DC

## 2018-05-04 NOTE — Progress Notes (Signed)
Endocrinology follow-up note   Subjective:    Patient ID: Russell Clark, male    DOB: [birth year].  he is being seen in follow-up for management of currently uncontrolled symptomatic diabetes requested by  Barbie Haggis, FNP.   Past Medical History:  Diagnosis Date  . Diabetes mellitus, type II (HCC)   . Hyperlipidemia   . Hypertension    Past Surgical History:  Procedure Laterality Date  . OTHER SURGICAL HISTORY     L foot, Cataracts   Social History   Socioeconomic History  . Marital status: Married    Spouse name: Not on file  . Number of children: Not on file  . Years of education: Not on file  . Highest education level: Not on file  Occupational History  . Not on file  Social Needs  . Financial resource strain: Not on file  . Food insecurity:    Worry: Not on file    Inability: Not on file  . Transportation needs:    Medical: Not on file    Non-medical: Not on file  Tobacco Use  . Smoking status: Former Smoker    Packs/day: 1.00    Years: 25.00    Pack years: 25.00    Types: Cigarettes  . Smokeless tobacco: Never Used  Substance and Sexual Activity  . Alcohol use: No  . Drug use: No  . Sexual activity: Not on file  Lifestyle  . Physical activity:    Days per week: Not on file    Minutes per session: Not on file  . Stress: Not on file  Relationships  . Social connections:    Talks on phone: Not on file    Gets together: Not on file    Attends religious service: Not on file    Active member of club or organization: Not on file    Attends meetings of clubs or organizations: Not on file    Relationship status: Not on file  Other Topics Concern  . Not on file  Social History Narrative  . Not on file   Outpatient Encounter Medications as of 05/04/2018  Medication Sig  . clopidogrel (PLAVIX) 75 MG tablet Take 75 mg by mouth daily.  . furosemide (LASIX) 20 MG tablet Take 20 mg by mouth daily.  Marland Kitchen buPROPion (WELLBUTRIN SR) 150 MG 12 hr tablet  Take 150 mg by mouth 2 (two) times daily.  . Cholecalciferol (VITAMIN D PO) Take by mouth.  . Continuous Blood Gluc Sensor (FREESTYLE LIBRE 14 DAY SENSOR) MISC 1 each by Other route every 14 (fourteen) days.  . insulin regular human CONCENTRATED (HUMULIN R U-500 KWIKPEN) 500 UNIT/ML kwikpen Inject 60 Units into the skin 3 (three) times daily with meals.  Marland Kitchen loperamide (IMODIUM A-D) 2 MG tablet Take 12 mg by mouth daily.  Marland Kitchen losartan-hydrochlorothiazide (HYZAAR) 100-12.5 MG tablet Take 1 tablet by mouth daily.  . metFORMIN (GLUCOPHAGE) 500 MG tablet Take 1 tablet (500 mg total) by mouth 2 (two) times daily with a meal.  . nortriptyline (PAMELOR) 25 MG capsule Take 25 mg by mouth at bedtime.  Marland Kitchen omeprazole (PRILOSEC) 40 MG capsule Take 40 mg by mouth daily.  . pravastatin (PRAVACHOL) 40 MG tablet Take 1 tablet (40 mg total) by mouth daily.  . pregabalin (LYRICA) 200 MG capsule Take 200 mg by mouth 3 (three) times daily.  . [DISCONTINUED] Continuous Blood Gluc Sensor (FREESTYLE LIBRE 14 DAY SENSOR) MISC 1 each by Other route every 14 (fourteen) days.  . [  DISCONTINUED] insulin aspart (NOVOLOG FLEXPEN) 100 UNIT/ML FlexPen Inject 25-31 Units into the skin 3 (three) times daily with meals.  . [DISCONTINUED] Insulin Glargine (LANTUS SOLOSTAR) 100 UNIT/ML Solostar Pen Inject 80 Units into the skin 2 (two) times daily at 8 am and 10 pm.  . [DISCONTINUED] metFORMIN (GLUCOPHAGE) 1000 MG tablet Take 500 mg by mouth 2 (two) times daily with a meal.   No facility-administered encounter medications on file as of 05/04/2018.     ALLERGIES: Allergies  Allergen Reactions  . Aspirin   . Darvon [Propoxyphene]   . Ibuprofen   . Penicillins     VACCINATION STATUS:  There is no immunization history on file for this patient.  Diabetes  He presents for his follow-up diabetic visit. He has type 2 diabetes mellitus. Onset time: He was diagnosed at approximate age of 41 years. His disease course has been worsening.  There are no hypoglycemic associated symptoms. Pertinent negatives for hypoglycemia include no confusion, headaches, pallor or seizures. Associated symptoms include polydipsia and polyuria. Pertinent negatives for diabetes include no blurred vision, no chest pain, no fatigue, no polyphagia and no weakness. There are no hypoglycemic complications. Symptoms are worsening. Diabetic complications include peripheral neuropathy. Risk factors for coronary artery disease include dyslipidemia, diabetes mellitus, hypertension, male sex, sedentary lifestyle and tobacco exposure. Current diabetic treatment includes insulin injections and oral agent (dual therapy). He is compliant with treatment most of the time. His weight is increasing steadily. He is following a generally unhealthy diet. When asked about meal planning, he reported none. He never participates in exercise. There is no change in his home blood glucose trend. His breakfast blood glucose range is generally >200 mg/dl. His lunch blood glucose range is generally >200 mg/dl. His dinner blood glucose range is generally >200 mg/dl. His bedtime blood glucose range is generally >200 mg/dl. His overall blood glucose range is >200 mg/dl. (His CGM analysis shows persistently above target blood glucose profile averaging 299.  94% above range, 6% time in range.  This agrees with his recent high A1c of 9.4%.) An ACE inhibitor/angiotensin II receptor blocker is being taken. He sees a podiatrist.Eye exam is current (He is due for his dilated eye exam, promises to do before next visit.).  Hyperlipidemia  This is a chronic problem. The current episode started more than 1 year ago. The problem is uncontrolled. Recent lipid tests were reviewed and are high. Exacerbating diseases include diabetes and obesity. Factors aggravating his hyperlipidemia include smoking. Pertinent negatives include no chest pain, myalgias or shortness of breath. Current antihyperlipidemic treatment  includes statins. Risk factors for coronary artery disease include dyslipidemia, diabetes mellitus, a sedentary lifestyle and obesity.  Hypertension  This is a chronic problem. The current episode started more than 1 year ago. The problem is controlled. Pertinent negatives include no blurred vision, chest pain, headaches, neck pain, palpitations or shortness of breath. Risk factors for coronary artery disease include dyslipidemia, diabetes mellitus, male gender, sedentary lifestyle and smoking/tobacco exposure. Past treatments include angiotensin blockers.    Review of Systems  Constitutional: Negative for chills, fatigue, fever and unexpected weight change.  HENT: Negative for dental problem, mouth sores and trouble swallowing.   Eyes: Negative for blurred vision and visual disturbance.  Respiratory: Negative for cough, choking, chest tightness, shortness of breath and wheezing.   Cardiovascular: Negative for chest pain, palpitations and leg swelling.  Gastrointestinal: Negative for abdominal distention, abdominal pain, constipation, diarrhea, nausea and vomiting.  Endocrine: Positive for polydipsia and polyuria. Negative  for polyphagia.  Genitourinary: Negative for dysuria, flank pain, hematuria and urgency.  Musculoskeletal: Negative for back pain, gait problem, myalgias and neck pain.  Skin: Negative for pallor, rash and wound.  Neurological: Negative for seizures, syncope, weakness, numbness and headaches.  Psychiatric/Behavioral: Negative for confusion and dysphoric mood.    Objective:    BP 106/77   Pulse 94   Ht 5\' 6"  (1.676 m)   Wt 217 lb (98.4 kg)   BMI 35.02 kg/m   Wt Readings from Last 3 Encounters:  05/04/18 217 lb (98.4 kg)  03/02/18 215 lb (97.5 kg)  02/15/18 213 lb (96.6 kg)     Physical Exam  Constitutional: He is oriented to person, place, and time. He appears well-developed. He is cooperative. No distress.  HENT:  Head: Normocephalic and atraumatic.  Eyes: EOM  are normal.  Neck: Normal range of motion. Neck supple. No tracheal deviation present. No thyromegaly present.  Cardiovascular: Normal rate, S1 normal and S2 normal. Exam reveals no gallop.  No murmur heard. Pulses:      Dorsalis pedis pulses are 0 on the right side, and 0 on the left side.       Posterior tibial pulses are 0 on the right side, and 0 on the left side.  Pulmonary/Chest: Effort normal. No respiratory distress. He has no wheezes.  Abdominal: He exhibits no distension. There is no tenderness. There is no guarding and no CVA tenderness.  Musculoskeletal: He exhibits no edema.       Right shoulder: He exhibits no swelling and no deformity.  Neurological: He is alert and oriented to person, place, and time. He has normal strength and normal reflexes. No cranial nerve deficit or sensory deficit. Gait normal.  Skin: Skin is warm and dry. No rash noted. No cyanosis. Nails show no clubbing.  Psychiatric: He has a normal mood and affect. His speech is normal. Cognition and memory are normal.    Recent Results (from the past 2160 hour(s))  Basic metabolic panel     Status: Abnormal   Collection Time: 04/28/18 12:00 AM  Result Value Ref Range   BUN 26 (A) 4 - 21   Creatinine 1.4 (A) 0.6 - 1.3  Hemoglobin A1c     Status: None   Collection Time: 04/28/18 12:00 AM  Result Value Ref Range   Hemoglobin A1C 8     Assessment & Plan:   1. Uncontrolled type 2 diabetes mellitus with other specified complication, with Rijos-term current use of insulin (HCC)  - Patient has currently uncontrolled symptomatic type 2 DM since  52 years of age. - He came with still significantly above target blood glucose profile, however significantly improved with A1c of 8% improving from 9.4%.  -He is currently on 235+ units of insulin daily. -Recent labs reviewed, showing normal renal function,.  -his diabetes is complicated by peripheral neuropathy, peripheral arterial disease, sedentary life and Randen E  [place] remains at a high risk for more acute and chronic complications which include CAD, CVA, CKD, retinopathy, and neuropathy. These are all discussed in detail with the patient.  - I have counseled him on diet management and weight loss, by adopting a carbohydrate restricted/protein rich diet.  -  Suggestion is made for him to avoid simple carbohydrates  from his diet including Cakes, Sweet Desserts / Pastries, Ice Cream, Soda (diet and regular), Sweet Tea, Candies, Chips, Cookies, Store Bought Juices, Alcohol in Excess of  1-2 drinks a day, Artificial Sweeteners, and "Sugar-free" Products.  This will help patient to have stable blood glucose profile and potentially avoid unintended weight gain.   - I encouraged him to switch to  unprocessed or minimally processed complex starch and increased protein intake (animal or plant source), fruits, and vegetables.  - he is advised to stick to a routine mealtimes to eat 3 meals  a day and avoid unnecessary snacks ( to snack only to correct hypoglycemia).    - I have approached him with the following individualized plan to manage diabetes and patient agrees:   -CGM analysis shows hyperglycemia 69% improving from 94% of the time, time range 31% improving from 6% .  No documented no reported hypoglycemia. -He is reeducated on insulin injection technique to rotate the injection site on his abdomen.   -He will continue to require higher dose of insulin.  -His insurance did not provide coverage for [place] or [place].  He will be switched to insulin U500 after he finishes his current supplies of Lantus and NovoLog.  -I advised him to continue Lantus 80 units twice daily, continue NovoLog  25  units 3 times a day before meals for pre-meal blood glucose above 90 mg/dL plus patient specific correction dose for pre-meal blood glucose above 150 mg/dL, associated with documenting of his blood glucose profile at least 4 times a day-before meals and at bedtime.   -He  is benefiting from continuous glucose monitoring device.  I encouraged him to wear this device at all times.    -In 4 weeks, he will bring all leftover Lantus and NovoLog and his new prescription for insulin U500 for demonstration.   - Patient is warned not to take insulin without proper monitoring per orders. -Patient is encouraged to call clinic for blood glucose levels less than 70 or above 300 mg /dl. - I discussed and lowered his metformin to 500 mg p.o. twice daily-after breakfast and supper.    - Patient specific target  A1c;  LDL, HDL, Triglycerides, and  Waist Circumference were discussed in detail.  2) BP/HTN: His blood pressure is controlled to target.  He is advised to continue blood pressure medications including Hyzaar.   3) Lipids/HPL: His recent lipid panel showed significantly improved lipid panel triglycerides 149 improving from 423. Better control of diabetes  helped control triglycerides.   He is advised to continue pravastatin 40 mg p.o. Nightly.   4)  Weight/Diet: CDE Consult  in progress , exercise, and detailed carbohydrates information provided.  5) Chronic Care/Health Maintenance:  -he  is on ACEI/ARB and Statin medications and  is encouraged to continue to follow up with Ophthalmology (he is significantly overdue), Dentist,  Podiatrist at least yearly or according to recommendations, and advised to  stay away from smoking. I have recommended yearly flu vaccine and pneumonia vaccination at least every 5 years; moderate intensity exercise for up to 150 minutes weekly; and  sleep for at least 7 hours a day.  - I advised patient to maintain close follow up with Barbie HaggisWilborne, Cynthia, FNP for primary care needs.  - Time spent with the patient: 25 min, of which >50% was spent in reviewing his blood glucose logs , discussing his hypo- and hyper-glycemic episodes, reviewing his current and  previous labs and insulin doses and developing a plan to avoid hypo- and hyper-glycemia.  Please refer to Patient Instructions for Blood Glucose Monitoring and Insulin/Medications Dosing Guide"  in media tab for additional information. Rinaldo Ratellaude E Loberg participated in the discussions, expressed understanding, and voiced agreement  with the above plans.  All questions were answered to his satisfaction. he is encouraged to contact clinic should he have any questions or concerns prior to his return visit.  Follow up plan: - Return in about 4 weeks (around 06/01/2018) for Follow up with Meter and Logs Only - no Labs.  Marquis LunchGebre Abriel Geesey, MD Phone: [phone]  Fax: [phone]   05/04/2018, 1:22 PM   This note was partially dictated with voice recognition software. Similar sounding words can be transcribed inadequately or may not  be corrected upon review.

## 2018-05-04 NOTE — Patient Instructions (Signed)

## 2018-05-31 ENCOUNTER — Encounter: Payer: Self-pay | Admitting: "Endocrinology

## 2018-05-31 ENCOUNTER — Ambulatory Visit (INDEPENDENT_AMBULATORY_CARE_PROVIDER_SITE_OTHER): Payer: Medicaid - Out of State | Admitting: "Endocrinology

## 2018-05-31 VITALS — BP 105/70 | HR 90 | Ht 66.0 in | Wt 216.0 lb

## 2018-05-31 DIAGNOSIS — I10 Essential (primary) hypertension: Secondary | ICD-10-CM | POA: Diagnosis not present

## 2018-05-31 DIAGNOSIS — E782 Mixed hyperlipidemia: Secondary | ICD-10-CM | POA: Diagnosis not present

## 2018-05-31 DIAGNOSIS — E1165 Type 2 diabetes mellitus with hyperglycemia: Secondary | ICD-10-CM | POA: Diagnosis not present

## 2018-05-31 NOTE — Patient Instructions (Signed)

## 2018-05-31 NOTE — Progress Notes (Signed)
Endocrinology follow-up note   Subjective:    Patient ID: Russell Clark, male    DOB: July 01, 1966.  he is being seen in follow-up for management of currently uncontrolled symptomatic type 2 diabetes, hyperlipidemia, hypertension. PCP:  Barbie Haggis, FNP.   Past Medical History:  Diagnosis Date  . Diabetes mellitus, type II (HCC)   . Hyperlipidemia   . Hypertension    Past Surgical History:  Procedure Laterality Date  . OTHER SURGICAL HISTORY     L foot, Cataracts   Social History   Socioeconomic History  . Marital status: Married    Spouse name: Not on file  . Number of children: Not on file  . Years of education: Not on file  . Highest education level: Not on file  Occupational History  . Not on file  Social Needs  . Financial resource strain: Not on file  . Food insecurity:    Worry: Not on file    Inability: Not on file  . Transportation needs:    Medical: Not on file    Non-medical: Not on file  Tobacco Use  . Smoking status: Former Smoker    Packs/day: 1.00    Years: 25.00    Pack years: 25.00    Types: Cigarettes  . Smokeless tobacco: Never Used  Substance and Sexual Activity  . Alcohol use: No  . Drug use: No  . Sexual activity: Not on file  Lifestyle  . Physical activity:    Days per week: Not on file    Minutes per session: Not on file  . Stress: Not on file  Relationships  . Social connections:    Talks on phone: Not on file    Gets together: Not on file    Attends religious service: Not on file    Active member of club or organization: Not on file    Attends meetings of clubs or organizations: Not on file    Relationship status: Not on file  Other Topics Concern  . Not on file  Social History Narrative  . Not on file   Outpatient Encounter Medications as of 05/31/2018  Medication Sig  . buPROPion (WELLBUTRIN SR) 150 MG 12 hr tablet Take 150 mg by mouth 2 (two) times daily.  . Cholecalciferol (VITAMIN D PO) Take by mouth.  .  clopidogrel (PLAVIX) 75 MG tablet Take 75 mg by mouth daily.  . Continuous Blood Gluc Sensor (FREESTYLE LIBRE 14 DAY SENSOR) MISC 1 each by Other route every 14 (fourteen) days.  . furosemide (LASIX) 20 MG tablet Take 20 mg by mouth daily.  . insulin regular human CONCENTRATED (HUMULIN R U-500 KWIKPEN) 500 UNIT/ML kwikpen Inject 60 Units into the skin 3 (three) times daily with meals.  Marland Kitchen loperamide (IMODIUM A-D) 2 MG tablet Take 12 mg by mouth daily.  Marland Kitchen losartan-hydrochlorothiazide (HYZAAR) 100-12.5 MG tablet Take 1 tablet by mouth daily.  . metFORMIN (GLUCOPHAGE) 500 MG tablet Take 1 tablet (500 mg total) by mouth 2 (two) times daily with a meal.  . nortriptyline (PAMELOR) 25 MG capsule Take 25 mg by mouth at bedtime.  Marland Kitchen omeprazole (PRILOSEC) 40 MG capsule Take 40 mg by mouth daily.  . pravastatin (PRAVACHOL) 40 MG tablet Take 1 tablet (40 mg total) by mouth daily.  . pregabalin (LYRICA) 200 MG capsule Take 200 mg by mouth 3 (three) times daily.   No facility-administered encounter medications on file as of 05/31/2018.     ALLERGIES: Allergies  Allergen Reactions  .  Aspirin   . Darvon [Propoxyphene]   . Ibuprofen   . Penicillins     VACCINATION STATUS:  There is no immunization history on file for this patient.  Diabetes  He presents for his follow-up diabetic visit. He has type 2 diabetes mellitus. Onset time: He was diagnosed at approximate age of 41 years. His disease course has been worsening. There are no hypoglycemic associated symptoms. Pertinent negatives for hypoglycemia include no confusion, headaches, pallor or seizures. Associated symptoms include polydipsia and polyuria. Pertinent negatives for diabetes include no blurred vision, no chest pain, no fatigue, no polyphagia and no weakness. There are no hypoglycemic complications. Symptoms are worsening. Diabetic complications include peripheral neuropathy. Risk factors for coronary artery disease include dyslipidemia, diabetes  mellitus, hypertension, male sex, sedentary lifestyle and tobacco exposure. Current diabetic treatment includes insulin injections and oral agent (dual therapy). He is compliant with treatment most of the time. His weight is increasing steadily. He is following a generally unhealthy diet. When asked about meal planning, he reported none. He never participates in exercise. There is no change in his home blood glucose trend. His breakfast blood glucose range is generally >200 mg/dl. His lunch blood glucose range is generally >200 mg/dl. His dinner blood glucose range is generally >200 mg/dl. His bedtime blood glucose range is generally >200 mg/dl. His overall blood glucose range is >200 mg/dl. (His CGM analysis shows persistently above target blood glucose profile averaging 299.  94% above range, 6% time in range.  This agrees with his recent high A1c of 9.4%.) An ACE inhibitor/angiotensin II receptor blocker is being taken. He sees a podiatrist.Eye exam is current (He is due for his dilated eye exam, promises to do before next visit.).  Hyperlipidemia  This is a chronic problem. The current episode started more than 1 year ago. The problem is uncontrolled. Recent lipid tests were reviewed and are high. Exacerbating diseases include diabetes and obesity. Factors aggravating his hyperlipidemia include smoking. Pertinent negatives include no chest pain, myalgias or shortness of breath. Current antihyperlipidemic treatment includes statins. Risk factors for coronary artery disease include dyslipidemia, diabetes mellitus, a sedentary lifestyle and obesity.  Hypertension  This is a chronic problem. The current episode started more than 1 year ago. The problem is controlled. Pertinent negatives include no blurred vision, chest pain, headaches, neck pain, palpitations or shortness of breath. Risk factors for coronary artery disease include dyslipidemia, diabetes mellitus, male gender, sedentary lifestyle and  smoking/tobacco exposure. Past treatments include angiotensin blockers.    Review of Systems  Constitutional: Negative for chills, fatigue, fever and unexpected weight change.  HENT: Negative for dental problem, mouth sores and trouble swallowing.   Eyes: Negative for blurred vision and visual disturbance.  Respiratory: Negative for cough, choking, chest tightness, shortness of breath and wheezing.   Cardiovascular: Negative for chest pain, palpitations and leg swelling.  Gastrointestinal: Negative for abdominal distention, abdominal pain, constipation, diarrhea, nausea and vomiting.  Endocrine: Positive for polydipsia and polyuria. Negative for polyphagia.  Genitourinary: Negative for dysuria, flank pain, hematuria and urgency.  Musculoskeletal: Negative for back pain, gait problem, myalgias and neck pain.  Skin: Negative for pallor, rash and wound.  Neurological: Negative for seizures, syncope, weakness, numbness and headaches.  Psychiatric/Behavioral: Negative for confusion and dysphoric mood.    Objective:    BP 105/70   Pulse 90   Ht 5\' 6"  (1.676 m)   Wt 216 lb (98 kg)   BMI 34.86 kg/m   Wt Readings from Last 3  Encounters:  05/31/18 216 lb (98 kg)  05/04/18 217 lb (98.4 kg)  03/02/18 215 lb (97.5 kg)     Physical Exam  Constitutional: He is oriented to person, place, and time. He appears well-developed. He is cooperative. No distress.  HENT:  Head: Normocephalic and atraumatic.  Eyes: EOM are normal.  Neck: Normal range of motion. Neck supple. No tracheal deviation present. No thyromegaly present.  Cardiovascular: Normal rate, S1 normal and S2 normal. Exam reveals no gallop.  No murmur heard. Pulses:      Dorsalis pedis pulses are 0 on the right side, and 0 on the left side.       Posterior tibial pulses are 0 on the right side, and 0 on the left side.  Pulmonary/Chest: Effort normal. No respiratory distress. He has no wheezes.  Abdominal: He exhibits no distension.  There is no tenderness. There is no guarding and no CVA tenderness.  Musculoskeletal: He exhibits no edema.       Right shoulder: He exhibits no swelling and no deformity.  Neurological: He is alert and oriented to person, place, and time. He has normal strength and normal reflexes. No cranial nerve deficit or sensory deficit. Gait normal.  Skin: Skin is warm and dry. No rash noted. No cyanosis. Nails show no clubbing.  Psychiatric: He has a normal mood and affect. His speech is normal. Cognition and memory are normal.    Recent Results (from the past 2160 hour(s))  Basic metabolic panel     Status: Abnormal   Collection Time: 04/28/18 12:00 AM  Result Value Ref Range   BUN 26 (A) 4 - 21   Creatinine 1.4 (A) 0.6 - 1.3  Hemoglobin A1c     Status: None   Collection Time: 04/28/18 12:00 AM  Result Value Ref Range   Hemoglobin A1C 8   Basic metabolic panel     Status: Abnormal   Collection Time: 04/28/18 12:00 AM  Result Value Ref Range   BUN 26 (A) 4 - 21   Creatinine 1.4 (A) 0.6 - 1.3    Assessment & Plan:   1. Uncontrolled type 2 diabetes mellitus with other specified complication, with Derwin-term current use of insulin (HCC)  - Patient has currently uncontrolled symptomatic type 2 DM since  52 years of age. - He still comes in with significantly above target blood glucose profile on significantly large dose of insulin above 250 units daily.    -Recent labs reviewed, showing normal renal function,.  -his diabetes is complicated by peripheral neuropathy, peripheral arterial disease, sedentary life and Celene KrasClaude E Mcnab remains at a high risk for more acute and chronic complications which include CAD, CVA, CKD, retinopathy, and neuropathy. These are all discussed in detail with the patient.  - I have counseled him on diet management and weight loss, by adopting a carbohydrate restricted/protein rich diet.  -  Suggestion is made for him to avoid simple carbohydrates  from his diet  including Cakes, Sweet Desserts / Pastries, Ice Cream, Soda (diet and regular), Sweet Tea, Candies, Chips, Cookies, Store Bought Juices, Alcohol in Excess of  1-2 drinks a day, Artificial Sweeteners, and "Sugar-free" Products. This will help patient to have stable blood glucose profile and potentially avoid unintended weight gain.   - I encouraged him to switch to  unprocessed or minimally processed complex starch and increased protein intake (animal or plant source), fruits, and vegetables.  - he is advised to stick to a routine mealtimes to eat 3  meals  a day and avoid unnecessary snacks ( to snack only to correct hypoglycemia).    - I have approached him with the following individualized plan to manage diabetes and patient agrees:  -  He will be switched to insulin U500 after he finishes his current supplies of Lantus and NovoLog. -He is reeducated on insulin injection technique with insulin Humulin U500, to rotate the injection site on his abdomen.  -I advised him to discontinue all other insulin types including Lantus and NovoLog.   -I discussed and initiated insulin Humulin U500 60 units 3 times daily AC for  pre-meal blood glucose above 90 mg/dL plus patient specific correction dose for pre-meal blood glucose above 150 mg/dL, associated with documenting of his blood glucose profile at least 4 times a day-before meals and at bedtime.   -He is advised to continue monitoring blood glucose strictly 4 times a day-before meals and at bedtime. - Patient is warned not to take insulin without proper monitoring per orders. -Patient is encouraged to call clinic for blood glucose levels less than 70 or above 300 mg /dl. - I advised him to continue on a lower dose of metformin,  500 mg p.o. twice daily-after breakfast and supper.    - Patient specific target  A1c;  LDL, HDL, Triglycerides, and  Waist Circumference were discussed in detail.  2) BP/HTN: His blood pressure is controlled to target.  He is  advised to continue blood pressure medications including Hyzaar.   3) Lipids/HPL: His recent lipid panel showed significantly improved lipid panel triglycerides 149 improving from 423. Better control of diabetes  helped control triglycerides.   He is advised to continue pravastatin 40 mg p.o. Nightly.   4)  Weight/Diet: CDE Consult  in progress , exercise, and detailed carbohydrates information provided.  5) Chronic Care/Health Maintenance:  -he  is on ACEI/ARB and Statin medications and  is encouraged to continue to follow up with Ophthalmology (he is significantly overdue), Dentist,  Podiatrist at least yearly or according to recommendations, and advised to  stay away from smoking. I have recommended yearly flu vaccine and pneumonia vaccination at least every 5 years; moderate intensity exercise for up to 150 minutes weekly; and  sleep for at least 7 hours a day.  - I advised patient to maintain close follow up with Barbie Haggis, FNP for primary care needs.  - Time spent with the patient: 25 min, of which >50% was spent in reviewing his blood glucose logs , discussing his hypo- and hyper-glycemic episodes, reviewing his current and  previous labs and insulin doses and developing a plan to avoid hypo- and hyper-glycemia. Please refer to Patient Instructions for Blood Glucose Monitoring and Insulin/Medications Dosing Guide"  in media tab for additional information. Rinaldo Ratel Couzens participated in the discussions, expressed understanding, and voiced agreement with the above plans.  All questions were answered to his satisfaction. he is encouraged to contact clinic should he have any questions or concerns prior to his return visit.  Follow up plan: - Return in about 9 weeks (around 08/02/2018) for Follow up with Pre-visit Labs, Meter, and Logs.  Marquis Lunch, MD Phone: [phone]  Fax: [phone]   05/31/2018, 1:19 PM   This note was partially dictated with voice recognition software.  Similar sounding words can be transcribed inadequately or may not  be corrected upon review.

## 2018-06-08 ENCOUNTER — Telehealth: Payer: Self-pay | Admitting: "Endocrinology

## 2018-06-08 NOTE — Telephone Encounter (Signed)
Russell Clark is stating that his blood sugar is running high  Sat 09/21 307-am 360-lunch  Sun 09/22 338-am 353-lunch 219-dinner  Mon 09/23 244-am 473-lunch 365-dinner  Tues 09/24 372-am 473-lunch

## 2018-06-08 NOTE — Telephone Encounter (Signed)
Advise to increase Humulin U500 to 80 units 3 times daily for  pre-meal blood glucose above 90 mg/dL.

## 2018-06-08 NOTE — Telephone Encounter (Signed)
Patient is aware of recommendation 

## 2018-08-02 ENCOUNTER — Ambulatory Visit: Payer: Medicaid - Out of State | Admitting: "Endocrinology

## 2018-08-02 LAB — LIPID PANEL
Cholesterol: 126 (ref 0–200)
HDL: 17 — AB (ref 35–70)
Triglycerides: 616 — AB (ref 40–160)

## 2018-08-02 LAB — BASIC METABOLIC PANEL
BUN: 25 — AB (ref 4–21)
Creatinine: 1.4 — AB (ref 0.6–1.3)

## 2018-08-02 LAB — HEMOGLOBIN A1C: Hemoglobin A1C: 8.2

## 2018-08-27 ENCOUNTER — Encounter: Payer: Self-pay | Admitting: "Endocrinology

## 2018-08-27 ENCOUNTER — Ambulatory Visit (INDEPENDENT_AMBULATORY_CARE_PROVIDER_SITE_OTHER): Payer: Medicaid - Out of State | Admitting: "Endocrinology

## 2018-08-27 VITALS — BP 110/68 | HR 98 | Ht 66.0 in | Wt 224.0 lb

## 2018-08-27 DIAGNOSIS — I10 Essential (primary) hypertension: Secondary | ICD-10-CM | POA: Diagnosis not present

## 2018-08-27 DIAGNOSIS — E1165 Type 2 diabetes mellitus with hyperglycemia: Secondary | ICD-10-CM | POA: Diagnosis not present

## 2018-08-27 DIAGNOSIS — E6609 Other obesity due to excess calories: Secondary | ICD-10-CM

## 2018-08-27 DIAGNOSIS — E782 Mixed hyperlipidemia: Secondary | ICD-10-CM

## 2018-08-27 DIAGNOSIS — Z6832 Body mass index (BMI) 32.0-32.9, adult: Secondary | ICD-10-CM

## 2018-08-27 MED ORDER — FREESTYLE LIBRE 14 DAY SENSOR MISC: 1.0000 | 2 refills | Status: DC

## 2018-08-27 MED ORDER — FENOFIBRATE 145 MG PO TABS: 145.0000 mg | ORAL_TABLET | Freq: Every day | ORAL | 2 refills | Status: DC

## 2018-08-27 MED ORDER — INSULIN REGULAR HUMAN (CONC) 500 UNIT/ML ~~LOC~~ SOPN: 100.0000 [IU] | PEN_INJECTOR | Freq: Three times a day (TID) | SUBCUTANEOUS | 2 refills | Status: DC

## 2018-08-27 NOTE — Progress Notes (Signed)
Endocrinology follow-up note   Subjective:    Patient ID: Russell Clark, male    DOB: 08/23/1966.  he is being seen in follow-up for management of currently uncontrolled symptomatic type 2 diabetes, hyperlipidemia, hypertension. PCP:  Barbie Haggis, FNP.   Past Medical History:  Diagnosis Date  . Diabetes mellitus, type II (HCC)   . Hyperlipidemia   . Hypertension    Past Surgical History:  Procedure Laterality Date  . OTHER SURGICAL HISTORY     L foot, Cataracts   Social History   Socioeconomic History  . Marital status: Married    Spouse name: Not on file  . Number of children: Not on file  . Years of education: Not on file  . Highest education level: Not on file  Occupational History  . Not on file  Social Needs  . Financial resource strain: Not on file  . Food insecurity:    Worry: Not on file    Inability: Not on file  . Transportation needs:    Medical: Not on file    Non-medical: Not on file  Tobacco Use  . Smoking status: Former Smoker    Packs/day: 1.00    Years: 25.00    Pack years: 25.00    Types: Cigarettes  . Smokeless tobacco: Never Used  Substance and Sexual Activity  . Alcohol use: No  . Drug use: No  . Sexual activity: Not on file  Lifestyle  . Physical activity:    Days per week: Not on file    Minutes per session: Not on file  . Stress: Not on file  Relationships  . Social connections:    Talks on phone: Not on file    Gets together: Not on file    Attends religious service: Not on file    Active member of club or organization: Not on file    Attends meetings of clubs or organizations: Not on file    Relationship status: Not on file  Other Topics Concern  . Not on file  Social History Narrative  . Not on file   Outpatient Encounter Medications as of 08/27/2018  Medication Sig  . buPROPion (WELLBUTRIN SR) 150 MG 12 hr tablet Take 150 mg by mouth 2 (two) times daily.  . Cholecalciferol (VITAMIN D PO) Take by mouth.  .  clopidogrel (PLAVIX) 75 MG tablet Take 75 mg by mouth daily.  . Continuous Blood Gluc Sensor (FREESTYLE LIBRE 14 DAY SENSOR) MISC 1 each by Other route every 14 (fourteen) days.  . Continuous Blood Gluc Sensor (FREESTYLE LIBRE 14 DAY SENSOR) MISC Inject 1 each into the skin every 14 (fourteen) days. Use as directed.  . furosemide (LASIX) 20 MG tablet Take 20 mg by mouth daily.  . insulin regular human CONCENTRATED (HUMULIN R U-500 KWIKPEN) 500 UNIT/ML kwikpen Inject 100 Units into the skin 3 (three) times daily with meals.  Marland Kitchen loperamide (IMODIUM A-D) 2 MG tablet Take 12 mg by mouth daily.  Marland Kitchen losartan-hydrochlorothiazide (HYZAAR) 100-12.5 MG tablet Take 1 tablet by mouth daily.  . metFORMIN (GLUCOPHAGE) 500 MG tablet Take 1 tablet (500 mg total) by mouth 2 (two) times daily with a meal.  . nortriptyline (PAMELOR) 25 MG capsule Take 25 mg by mouth at bedtime.  Marland Kitchen omeprazole (PRILOSEC) 40 MG capsule Take 40 mg by mouth daily.  . pravastatin (PRAVACHOL) 40 MG tablet Take 1 tablet (40 mg total) by mouth daily.  . pregabalin (LYRICA) 300 MG capsule Take 300 mg by mouth  3 (three) times daily.   . [DISCONTINUED] insulin regular human CONCENTRATED (HUMULIN R U-500 KWIKPEN) 500 UNIT/ML kwikpen Inject 60 Units into the skin 3 (three) times daily with meals. (Patient taking differently: Inject 80 Units into the skin 3 (three) times daily with meals. )   No facility-administered encounter medications on file as of 08/27/2018.     ALLERGIES: Allergies  Allergen Reactions  . Aspirin   . Darvon [Propoxyphene]   . Ibuprofen   . Penicillins     VACCINATION STATUS:  There is no immunization history on file for this patient.  Diabetes  He presents for his follow-up diabetic visit. He has type 2 diabetes mellitus. Onset time: He was diagnosed at approximate age of 41 years. His disease course has been worsening. There are no hypoglycemic associated symptoms. Pertinent negatives for hypoglycemia include no  confusion, headaches, pallor or seizures. Associated symptoms include polydipsia and polyuria. Pertinent negatives for diabetes include no blurred vision, no chest pain, no fatigue, no polyphagia and no weakness. There are no hypoglycemic complications. Symptoms are worsening. Diabetic complications include peripheral neuropathy. Risk factors for coronary artery disease include dyslipidemia, diabetes mellitus, hypertension, male sex, sedentary lifestyle and tobacco exposure. Current diabetic treatment includes insulin injections and oral agent (dual therapy). He is compliant with treatment most of the time. His weight is increasing steadily. He is following a generally unhealthy diet. When asked about meal planning, he reported none. He never participates in exercise. There is no change in his home blood glucose trend. His breakfast blood glucose range is generally >200 mg/dl. His lunch blood glucose range is generally >200 mg/dl. His dinner blood glucose range is generally >200 mg/dl. His bedtime blood glucose range is generally >200 mg/dl. His overall blood glucose range is >200 mg/dl. (His CGM analysis shows distantly above target glycemic profile averaging 312 over the last 14 days, 85% above target, 15% time in range.    ) An ACE inhibitor/angiotensin II receptor blocker is being taken. He sees a podiatrist.Eye exam is current (He is due for his dilated eye exam, promises to do before next visit.).  Hyperlipidemia  This is a chronic problem. The current episode started more than 1 year ago. The problem is uncontrolled. Recent lipid tests were reviewed and are high. Exacerbating diseases include diabetes and obesity. Factors aggravating his hyperlipidemia include smoking. Pertinent negatives include no chest pain, myalgias or shortness of breath. Current antihyperlipidemic treatment includes statins. Risk factors for coronary artery disease include dyslipidemia, diabetes mellitus, a sedentary lifestyle and  obesity.  Hypertension  This is a chronic problem. The current episode started more than 1 year ago. The problem is controlled. Pertinent negatives include no blurred vision, chest pain, headaches, neck pain, palpitations or shortness of breath. Risk factors for coronary artery disease include dyslipidemia, diabetes mellitus, male gender, sedentary lifestyle and smoking/tobacco exposure. Past treatments include angiotensin blockers.    Review of Systems  Constitutional: Negative for chills, fatigue, fever and unexpected weight change.  HENT: Negative for dental problem, mouth sores and trouble swallowing.   Eyes: Negative for blurred vision and visual disturbance.  Respiratory: Negative for cough, choking, chest tightness, shortness of breath and wheezing.   Cardiovascular: Negative for chest pain, palpitations and leg swelling.  Gastrointestinal: Negative for abdominal distention, abdominal pain, constipation, diarrhea, nausea and vomiting.  Endocrine: Positive for polydipsia and polyuria. Negative for polyphagia.  Genitourinary: Negative for dysuria, flank pain, hematuria and urgency.  Musculoskeletal: Negative for back pain, gait problem, myalgias and neck  pain.  Skin: Negative for pallor, rash and wound.  Neurological: Negative for seizures, syncope, weakness, numbness and headaches.  Psychiatric/Behavioral: Negative for confusion and dysphoric mood.    Objective:    BP 110/68   Pulse 98   Ht 5\' 6"  (1.676 m)   Wt 224 lb (101.6 kg)   BMI 36.15 kg/m   Wt Readings from Last 3 Encounters:  08/27/18 224 lb (101.6 kg)  05/31/18 216 lb (98 kg)  05/04/18 217 lb (98.4 kg)     Physical Exam  Constitutional: He is oriented to person, place, and time. He appears well-developed. He is cooperative. No distress.  HENT:  Head: Normocephalic and atraumatic.  Eyes: EOM are normal.  Neck: Normal range of motion. Neck supple. No tracheal deviation present. No thyromegaly present.   Cardiovascular: Normal rate, S1 normal and S2 normal. Exam reveals no gallop.  No murmur heard. Pulses:      Dorsalis pedis pulses are 0 on the right side and 0 on the left side.       Posterior tibial pulses are 0 on the right side and 0 on the left side.  Pulmonary/Chest: Effort normal. No respiratory distress. He has no wheezes.  Abdominal: He exhibits no distension. There is no abdominal tenderness. There is no guarding and no CVA tenderness.  Musculoskeletal:        General: No edema.     Right shoulder: He exhibits no swelling and no deformity.  Neurological: He is alert and oriented to person, place, and time. He has normal strength and normal reflexes. No cranial nerve deficit or sensory deficit. Gait normal.  Skin: Skin is warm and dry. No rash noted. No cyanosis. Nails show no clubbing.  Psychiatric: He has a normal mood and affect. His speech is normal. Cognition and memory are normal.    Recent Results (from the past 2160 hour(s))  Basic metabolic panel     Status: Abnormal   Collection Time: 08/02/18 12:00 AM  Result Value Ref Range   BUN 25 (A) 4 - 21   Creatinine 1.4 (A) 0.6 - 1.3  Lipid panel     Status: Abnormal   Collection Time: 08/02/18 12:00 AM  Result Value Ref Range   Triglycerides 616 (A) 40 - 160   Cholesterol 126 0 - 200   HDL 17 (A) 35 - 70  Hemoglobin A1c     Status: None   Collection Time: 08/02/18 12:00 AM  Result Value Ref Range   Hemoglobin A1C 8.2     Assessment & Plan:   1. Uncontrolled type 2 diabetes mellitus with other specified complication, with Bergstresser-term current use of insulin (HCC)  - Patient has currently uncontrolled symptomatic type 2 DM since  52 years of age. - He comes in with continued severe hyperglycemia averaging 312 for the last 14 days, 85% above target, 50% time in range.    -Recent labs reviewed, showing normal renal function,.  -his diabetes is complicated by peripheral neuropathy, peripheral arterial disease,  sedentary life and Edan E Schwan remains at a high risk for more acute and chronic complications which include CAD, CVA, CKD, retinopathy, and neuropathy. These are all discussed in detail with the patient.  - I have counseled him on diet management and weight loss, by adopting a carbohydrate restricted/protein rich diet. -Patient admits to dietary indiscretions including continuous snacking as well as consumption of sweetened beverages throughout the day.  -  Suggestion is made for him to avoid simple carbohydrates  from his diet including Cakes, Sweet Desserts / Pastries, Ice Cream, Soda (diet and regular), Sweet Tea, Candies, Chips, Cookies, Store Bought Juices, Alcohol in Excess of  1-2 drinks a day, Artificial Sweeteners, and "Sugar-free" Products. This will help patient to have stable blood glucose profile and potentially avoid unintended weight gain.  - I encouraged him to switch to  unprocessed or minimally processed complex starch and increased protein intake (animal or plant source), fruits, and vegetables.  - he is advised to stick to a routine mealtimes to eat 3 meals  a day and avoid unnecessary snacks ( to snack only to correct hypoglycemia).    - I have approached him with the following individualized plan to manage diabetes and patient agrees:  -He will continue to require higher dose of  insulin U500 in order for him to achieve and maintain control of diabetes. -He is advised to increase  insulin Humulin U500  To 100 units 3 times daily AC for  pre-meal blood glucose above 90 mg/dL plus patient specific correction dose for pre-meal blood glucose above 150 mg/dL, associated with documenting of his blood glucose profile at least 4 times a day-before meals and at bedtime.   -He is advised to continue monitoring blood glucose strictly 4 times a day-before meals and at bedtime. - Patient is warned not to take insulin without proper monitoring per orders. -Patient is encouraged to call  clinic for blood glucose levels less than 70 or above 300 mg /dl. -He is advised to continue  metformin,  500 mg p.o. twice daily-after breakfast and supper.    - Patient specific target  A1c;  LDL, HDL, Triglycerides, and  Waist Circumference were discussed in detail.  2) BP/HTN: His blood pressure is controlled to target.  He is advised to continue blood pressure medications including Hyzaar.   3) Lipids/HPL: His recent lipid panel showed significantly improved lipid panel triglycerides increasing to 616 from.  He is approached with the fact that better  control of diabetes will help control triglycerides.  He is advised to continue pravastatin 40 mg p.o. Nightly.  He will need additional treatment with fenofibrate 145 mg p.o. nightly.   4)  Weight/Diet: CDE Consult  in progress , exercise, and detailed carbohydrates information provided.  5) Chronic Care/Health Maintenance:  -he  is on ACEI/ARB and Statin medications and  is encouraged to continue to follow up with Ophthalmology (he is significantly overdue), Dentist,  Podiatrist at least yearly or according to recommendations, and advised to  stay away from smoking. I have recommended yearly flu vaccine and pneumonia vaccination at least every 5 years; moderate intensity exercise for up to 150 minutes weekly; and  sleep for at least 7 hours a day.  - I advised patient to maintain close follow up with Barbie Haggis, FNP for primary care needs.  - Time spent with the patient: 25 min, of which >50% was spent in reviewing his blood glucose logs , discussing his hypo- and hyper-glycemic episodes, reviewing his current and  previous labs and insulin doses and developing a plan to avoid hypo- and hyper-glycemia. Please refer to Patient Instructions for Blood Glucose Monitoring and Insulin/Medications Dosing Guide"  in media tab for additional information. Rinaldo Ratel Desantis participated in the discussions, expressed understanding, and voiced  agreement with the above plans.  All questions were answered to his satisfaction. he is encouraged to contact clinic should he have any questions or concerns prior to his return visit.   Follow up  plan: - Return in about 3 months (around 11/26/2018) for Follow up with Pre-visit Labs, Meter, and Logs.  Marquis Lunch, MD Phone: [phone]  Fax: [phone]   08/27/2018, 1:51 PM   This note was partially dictated with voice recognition software. Similar sounding words can be transcribed inadequately or may not  be corrected upon review.

## 2018-08-27 NOTE — Patient Instructions (Signed)

## 2018-11-16 ENCOUNTER — Other Ambulatory Visit: Payer: Self-pay | Admitting: "Endocrinology

## 2018-11-22 ENCOUNTER — Other Ambulatory Visit: Payer: Self-pay | Admitting: "Endocrinology

## 2018-11-23 LAB — HGB A1C W/O EAG: HEMOGLOBIN A1C: 10 % — AB (ref 4.8–5.6)

## 2018-11-29 ENCOUNTER — Ambulatory Visit (INDEPENDENT_AMBULATORY_CARE_PROVIDER_SITE_OTHER): Payer: Medicaid - Out of State | Admitting: "Endocrinology

## 2018-11-29 ENCOUNTER — Other Ambulatory Visit: Payer: Self-pay

## 2018-11-29 ENCOUNTER — Encounter: Payer: Self-pay | Admitting: "Endocrinology

## 2018-11-29 VITALS — BP 112/73 | HR 86 | Ht 66.0 in | Wt 229.0 lb

## 2018-11-29 DIAGNOSIS — E782 Mixed hyperlipidemia: Secondary | ICD-10-CM | POA: Diagnosis not present

## 2018-11-29 DIAGNOSIS — E1165 Type 2 diabetes mellitus with hyperglycemia: Secondary | ICD-10-CM

## 2018-11-29 DIAGNOSIS — I10 Essential (primary) hypertension: Secondary | ICD-10-CM

## 2018-11-29 DIAGNOSIS — E6609 Other obesity due to excess calories: Secondary | ICD-10-CM | POA: Diagnosis not present

## 2018-11-29 DIAGNOSIS — Z6832 Body mass index (BMI) 32.0-32.9, adult: Secondary | ICD-10-CM

## 2018-11-29 MED ORDER — INSULIN REGULAR HUMAN (CONC) 500 UNIT/ML ~~LOC~~ SOPN: PEN_INJECTOR | SUBCUTANEOUS | 2 refills | Status: DC

## 2018-11-29 NOTE — Patient Instructions (Signed)

## 2018-11-29 NOTE — Progress Notes (Signed)
Endocrinology follow-up note   Subjective:    Patient ID: CARRY MCREA, male    DOB: 07-Apr-1966.  he is being seen in follow-up for management of currently uncontrolled symptomatic type 2 diabetes, hyperlipidemia, hypertension. PCP:  Barbie Haggis, FNP.   Past Medical History:  Diagnosis Date  . Diabetes mellitus, type II (HCC)   . Hyperlipidemia   . Hypertension    Past Surgical History:  Procedure Laterality Date  . OTHER SURGICAL HISTORY     L foot, Cataracts   Social History   Socioeconomic History  . Marital status: Married    Spouse name: Not on file  . Number of children: Not on file  . Years of education: Not on file  . Highest education level: Not on file  Occupational History  . Not on file  Social Needs  . Financial resource strain: Not on file  . Food insecurity:    Worry: Not on file    Inability: Not on file  . Transportation needs:    Medical: Not on file    Non-medical: Not on file  Tobacco Use  . Smoking status: Former Smoker    Packs/day: 1.00    Years: 25.00    Pack years: 25.00    Types: Cigarettes  . Smokeless tobacco: Never Used  Substance and Sexual Activity  . Alcohol use: No  . Drug use: No  . Sexual activity: Not on file  Lifestyle  . Physical activity:    Days per week: Not on file    Minutes per session: Not on file  . Stress: Not on file  Relationships  . Social connections:    Talks on phone: Not on file    Gets together: Not on file    Attends religious service: Not on file    Active member of club or organization: Not on file    Attends meetings of clubs or organizations: Not on file    Relationship status: Not on file  Other Topics Concern  . Not on file  Social History Narrative  . Not on file   Outpatient Encounter Medications as of 11/29/2018  Medication Sig  . buPROPion (WELLBUTRIN SR) 150 MG 12 hr tablet Take 150 mg by mouth 2 (two) times daily.  . Cholecalciferol (VITAMIN D PO) Take by mouth.  .  clopidogrel (PLAVIX) 75 MG tablet Take 75 mg by mouth daily.  . Continuous Blood Gluc Sensor (FREESTYLE LIBRE 14 DAY SENSOR) MISC 1 each by Other route every 14 (fourteen) days.  . Continuous Blood Gluc Sensor (FREESTYLE LIBRE 14 DAY SENSOR) MISC Inject 1 each into the skin every 14 (fourteen) days. Use as directed.  . fenofibrate (TRICOR) 145 MG tablet Take 1 tablet by mouth once daily  . furosemide (LASIX) 40 MG tablet Take 40 mg by mouth daily.   . insulin regular human CONCENTRATED (HUMULIN R U-500 KWIKPEN) 500 UNIT/ML kwikpen Inject 110 units with breakfast, 110 units with lunch, and 100 units with supper when pre-meal blood glucose readings are above 90 mg/dL.  Marland Kitchen loperamide (IMODIUM A-D) 2 MG tablet Take 12 mg by mouth daily.  Marland Kitchen losartan-hydrochlorothiazide (HYZAAR) 100-12.5 MG tablet Take 1 tablet by mouth daily.  . metFORMIN (GLUCOPHAGE) 500 MG tablet TAKE 1 TABLET (500 MG TOTAL) BY MOUTH TWICE DAILY WITH A MEAL  . nortriptyline (PAMELOR) 25 MG capsule Take 25 mg by mouth at bedtime.  Marland Kitchen omeprazole (PRILOSEC) 40 MG capsule Take 40 mg by mouth daily.  . pravastatin (PRAVACHOL)  40 MG tablet Take 1 tablet (40 mg total) by mouth daily.  . pregabalin (LYRICA) 300 MG capsule Take 300 mg by mouth 3 (three) times daily.   . [DISCONTINUED] insulin regular human CONCENTRATED (HUMULIN R U-500 KWIKPEN) 500 UNIT/ML kwikpen Inject 100 Units into the skin 3 (three) times daily with meals.   No facility-administered encounter medications on file as of 11/29/2018.     ALLERGIES: Allergies  Allergen Reactions  . Aspirin   . Darvon [Propoxyphene]   . Ibuprofen   . Penicillins     VACCINATION STATUS:  There is no immunization history on file for this patient.  Diabetes  He presents for his follow-up diabetic visit. He has type 2 diabetes mellitus. Onset time: He was diagnosed at approximate age of 41 years. His disease course has been worsening. There are no hypoglycemic associated symptoms.  Pertinent negatives for hypoglycemia include no confusion, headaches, pallor or seizures. Associated symptoms include polydipsia and polyuria. Pertinent negatives for diabetes include no blurred vision, no chest pain, no fatigue, no polyphagia and no weakness. There are no hypoglycemic complications. Symptoms are worsening. Diabetic complications include peripheral neuropathy. Risk factors for coronary artery disease include dyslipidemia, diabetes mellitus, hypertension, male sex, sedentary lifestyle and tobacco exposure. Current diabetic treatment includes insulin injections and oral agent (dual therapy). He is compliant with treatment most of the time. His weight is increasing steadily. He is following a generally unhealthy diet. When asked about meal planning, he reported none. He never participates in exercise. His home blood glucose trend is increasing steadily. His breakfast blood glucose range is generally 180-200 mg/dl. His lunch blood glucose range is generally >200 mg/dl. His dinner blood glucose range is generally >200 mg/dl. His bedtime blood glucose range is generally >200 mg/dl. His overall blood glucose range is >200 mg/dl. An ACE inhibitor/angiotensin II receptor blocker is being taken. He sees a podiatrist.Eye exam is current (He is due for his dilated eye exam, promises to do before next visit.).  Hyperlipidemia  This is a chronic problem. The current episode started more than 1 year ago. The problem is uncontrolled. Recent lipid tests were reviewed and are high. Exacerbating diseases include diabetes and obesity. Factors aggravating his hyperlipidemia include smoking. Pertinent negatives include no chest pain, myalgias or shortness of breath. Current antihyperlipidemic treatment includes statins. Risk factors for coronary artery disease include dyslipidemia, diabetes mellitus, a sedentary lifestyle and obesity.  Hypertension  This is a chronic problem. The current episode started more than 1  year ago. The problem is controlled. Pertinent negatives include no blurred vision, chest pain, headaches, neck pain, palpitations or shortness of breath. Risk factors for coronary artery disease include dyslipidemia, diabetes mellitus, male gender, sedentary lifestyle and smoking/tobacco exposure. Past treatments include angiotensin blockers.    Review of Systems  Constitutional: Negative for chills, fatigue, fever and unexpected weight change.  HENT: Negative for dental problem, mouth sores and trouble swallowing.   Eyes: Negative for blurred vision and visual disturbance.  Respiratory: Negative for cough, choking, chest tightness, shortness of breath and wheezing.   Cardiovascular: Negative for chest pain, palpitations and leg swelling.  Gastrointestinal: Negative for abdominal distention, abdominal pain, constipation, diarrhea, nausea and vomiting.  Endocrine: Positive for polydipsia and polyuria. Negative for polyphagia.  Genitourinary: Negative for dysuria, flank pain, hematuria and urgency.  Musculoskeletal: Negative for back pain, gait problem, myalgias and neck pain.  Skin: Negative for pallor, rash and wound.  Neurological: Negative for seizures, syncope, weakness, numbness and headaches.  Psychiatric/Behavioral:  Negative for confusion and dysphoric mood.    Objective:    BP 112/73   Pulse 86   Ht  (1.676 m)   Wt 229 lb (103.9 kg)   BMI 36.96 kg/m   Wt Readings from Last 3 Encounters:  11/29/18 229 lb (103.9 kg)  08/27/18 224 lb (101.6 kg)  05/31/18 216 lb (98 kg)     Physical Exam  Constitutional: He is oriented to person, place, and time. He appears well-developed. He is cooperative. No distress.  HENT:  Head: Normocephalic and atraumatic.  Eyes: EOM are normal.  Neck: Normal range of motion. Neck supple. No tracheal deviation present. No thyromegaly present.  Cardiovascular: Normal rate, S1 normal and S2 normal. Exam reveals no gallop.  No murmur  heard. Pulses:      Dorsalis pedis pulses are 0 on the right side and 0 on the left side.       Posterior tibial pulses are 0 on the right side and 0 on the left side.  Pulmonary/Chest: Effort normal. No respiratory distress. He has no wheezes.  Abdominal: He exhibits no distension. There is no abdominal tenderness. There is no guarding and no CVA tenderness.  Musculoskeletal:        General: No edema.     Right shoulder: He exhibits no swelling and no deformity.  Neurological: He is alert and oriented to person, place, and time. He has normal strength and normal reflexes. No cranial nerve deficit or sensory deficit. Gait normal.  Skin: Skin is warm and dry. No rash noted. No cyanosis. Nails show no clubbing.  Psychiatric: He has a normal mood and affect. His speech is normal. Cognition and memory are normal.    Recent Results (from the past 2160 hour(s))  Hgb A1c w/o eAG     Status: Abnormal   Collection Time: 11/22/18  1:08 PM  Result Value Ref Range   Hgb A1c MFr Bld 10.0 (H) 4.8 - 5.6 %    Comment:          Prediabetes: 5.7 - 6.4          Diabetes: >6.4          Glycemic control for adults with diabetes: <7.0     Assessment & Plan:   1. Uncontrolled type 2 diabetes mellitus with other specified complication, with Tibbs-term current use of insulin (HCC)  - Patient has currently uncontrolled symptomatic type 2 DM since  53 years of age. - He comes in with continued severe hyperglycemia averaging 312 for the last 14 days, 85% above target, 50% time in range.    -Recent labs reviewed, showing normal renal function,.  -his diabetes is complicated by peripheral neuropathy, peripheral arterial disease,  sedentary life and Mikhail E Aldous remains at a high risk for more acute and chronic complications which include CAD, CVA, CKD, retinopathy, and neuropathy. These are all discussed in detail with the patient.  - I have counseled him on diet management and weight loss, by adopting a  carbohydrate restricted/protein rich diet.  - Patient admits there is a room for improvement in his diet and drink choices. -  Suggestion is made for him to avoid simple carbohydrates  from his diet including Cakes, Sweet Desserts / Pastries, Ice Cream, Soda (diet and regular), Sweet Tea, Candies, Chips, Cookies, Store Bought Juices, Alcohol in Excess of  1-2 drinks a day, Artificial Sweeteners, and "Sugar-free" Products. This will help patient to have stable blood glucose profile and potentially avoid  unintended weight gain.  - I encouraged him to switch to  unprocessed or minimally processed complex starch and increased protein intake (animal or plant source), fruits, and vegetables.  - he is advised to stick to a routine mealtimes to eat 3 meals  a day and avoid unnecessary snacks ( to snack only to correct hypoglycemia).    - I have approached him with the following individualized plan to manage diabetes and patient agrees:  -He will continue to require higher dose of  insulin U500 in order for him to achieve and maintain control of diabetes. -He is advised to increase  insulin Humulin U500  To 110 units , with breakfast, 110 with lunch and keep at 100 units with supper  for  pre-meal blood glucose above 90 mg/dL plus patient specific correction dose for pre-meal blood glucose above 150 mg/dL, associated with documenting of his blood glucose profile at least 4 times a day-before meals and at bedtime.   -He is advised to continue monitoring blood glucose strictly 4 times a day-before meals and at bedtime. -He is warned not to take insulin  without proper monitoring per orders. -Patient is encouraged to call clinic for blood glucose levels less than 70 or above 300 mg /dl. -He is advised to continue  metformin,  500 mg p.o. twice daily-after breakfast and supper.    - Patient specific target  A1c;  LDL, HDL, Triglycerides, and  Waist Circumference were discussed in detail.  2) BP/HTN: His blood  pressure is controlled to target.  He is advised to continue blood pressure medications including Hyzaar.   3) Lipids/HPL: His recent lipid panel showed significantly improved lipid panel triglycerides increasing to 616 from.  He is approached with the fact that better  control of diabetes will help control triglycerides.  He is advised to continue pravastatin 40 mg p.o. Nightly.  He will need additional treatment with fenofibrate 145 mg p.o. nightly.   4)  Weight/Diet: CDE Consult  in progress , exercise, and detailed carbohydrates information provided.  5) Chronic Care/Health Maintenance:  -he  is on ACEI/ARB and Statin medications and  is encouraged to continue to follow up with Ophthalmology (he is significantly overdue), Dentist,  Podiatrist at least yearly or according to recommendations, and advised to  stay away from smoking. I have recommended yearly flu vaccine and pneumonia vaccination at least every 5 years; moderate intensity exercise for up to 150 minutes weekly; and  sleep for at least 7 hours a day.  - I advised patient to maintain close follow up with Barbie Haggis, FNP for primary care needs.  - Time spent with the patient: 25 min, of which >50% was spent in reviewing his blood glucose logs , discussing his hypoglycemia and hyperglycemia episodes, reviewing his current and  previous labs / studies and medications  doses and developing a plan to avoid hypoglycemia and hyperglycemia. Please refer to Patient Instructions for Blood Glucose Monitoring and Insulin/Medications Dosing Guide"  in media tab for additional information. Please  also refer to " Patient Self Inventory" in the Media  tab for reviewed elements of pertinent patient history.  Rinaldo Ratel Markus participated in the discussions, expressed understanding, and voiced agreement with the above plans.  All questions were answered to his satisfaction. he is encouraged to contact clinic should he have any questions or concerns  prior to his return visit.    Follow up plan: - Return in about 3 months (around 03/01/2019) for Meter, and Logs.  Marquis Lunch, MD Phone: [phone]  Fax: [phone]   11/29/2018, 10:16 AM   This note was partially dictated with voice recognition software. Similar sounding words can be transcribed inadequately or may not  be corrected upon review.

## 2018-12-16 ENCOUNTER — Other Ambulatory Visit: Payer: Self-pay | Admitting: "Endocrinology

## 2018-12-18 ENCOUNTER — Other Ambulatory Visit: Payer: Self-pay | Admitting: "Endocrinology

## 2019-01-09 ENCOUNTER — Other Ambulatory Visit: Payer: Self-pay | Admitting: "Endocrinology

## 2019-01-15 ENCOUNTER — Other Ambulatory Visit: Payer: Self-pay | Admitting: "Endocrinology

## 2019-02-21 ENCOUNTER — Other Ambulatory Visit: Payer: Self-pay | Admitting: "Endocrinology

## 2019-02-22 LAB — COMPREHENSIVE METABOLIC PANEL
ALT: 37 IU/L (ref 0–44)
AST: 27 IU/L (ref 0–40)
Albumin/Globulin Ratio: 2.1 (ref 1.2–2.2)
Albumin: 3.9 g/dL (ref 3.8–4.9)
Alkaline Phosphatase: 91 IU/L (ref 39–117)
BUN/Creatinine Ratio: 13 (ref 9–20)
BUN: 18 mg/dL (ref 6–24)
Bilirubin Total: 0.3 mg/dL (ref 0.0–1.2)
CO2: 26 mmol/L (ref 20–29)
Calcium: 9.2 mg/dL (ref 8.7–10.2)
Chloride: 105 mmol/L (ref 96–106)
Creatinine, Ser: 1.42 mg/dL — ABNORMAL HIGH (ref 0.76–1.27)
GFR calc Af Amer: 66 mL/min/{1.73_m2} (ref >59)
GFR calc non Af Amer: 57 mL/min/{1.73_m2} — ABNORMAL LOW (ref >59)
Globulin, Total: 1.9 g/dL (ref 1.5–4.5)
Glucose: 204 mg/dL — ABNORMAL HIGH (ref 65–99)
Potassium: 4.2 mmol/L (ref 3.5–5.2)
Sodium: 143 mmol/L (ref 134–144)
Total Protein: 5.8 g/dL — ABNORMAL LOW (ref 6.0–8.5)

## 2019-02-22 LAB — VITAMIN D 25 HYDROXY (VIT D DEFICIENCY, FRACTURES): Vit D, 25-Hydroxy: 29.7 ng/mL — ABNORMAL LOW (ref 30.0–100.0)

## 2019-02-22 LAB — HGB A1C W/O EAG: Hgb A1c MFr Bld: 11.1 % — ABNORMAL HIGH (ref 4.8–5.6)

## 2019-02-22 LAB — T4, FREE: Free T4: 1.07 ng/dL (ref 0.82–1.77)

## 2019-02-22 LAB — MICROALBUMIN / CREATININE URINE RATIO
Creatinine, Urine: 100.9 mg/dL
Microalb/Creat Ratio: 14 mg/g creat (ref 0–29)
Microalbumin, Urine: 14 ug/mL

## 2019-02-22 LAB — TSH: TSH: 1.41 u[IU]/mL (ref 0.450–4.500)

## 2019-03-01 ENCOUNTER — Encounter: Payer: Self-pay | Admitting: Nutrition

## 2019-03-01 ENCOUNTER — Encounter: Payer: Medicaid - Out of State | Attending: Nurse Practitioner | Admitting: Nutrition

## 2019-03-01 ENCOUNTER — Ambulatory Visit (INDEPENDENT_AMBULATORY_CARE_PROVIDER_SITE_OTHER): Payer: Medicaid - Out of State | Admitting: "Endocrinology

## 2019-03-01 ENCOUNTER — Other Ambulatory Visit: Payer: Self-pay

## 2019-03-01 ENCOUNTER — Encounter: Payer: Self-pay | Admitting: "Endocrinology

## 2019-03-01 VITALS — Ht 68.0 in | Wt 229.0 lb

## 2019-03-01 VITALS — BP 131/82 | HR 86 | Ht 66.0 in | Wt 235.0 lb

## 2019-03-01 DIAGNOSIS — E782 Mixed hyperlipidemia: Secondary | ICD-10-CM

## 2019-03-01 DIAGNOSIS — I10 Essential (primary) hypertension: Secondary | ICD-10-CM | POA: Diagnosis not present

## 2019-03-01 DIAGNOSIS — IMO0002 Reserved for concepts with insufficient information to code with codable children: Secondary | ICD-10-CM

## 2019-03-01 DIAGNOSIS — E1165 Type 2 diabetes mellitus with hyperglycemia: Secondary | ICD-10-CM | POA: Insufficient documentation

## 2019-03-01 DIAGNOSIS — N183 Chronic kidney disease, stage 3 unspecified: Secondary | ICD-10-CM

## 2019-03-01 DIAGNOSIS — E118 Type 2 diabetes mellitus with unspecified complications: Secondary | ICD-10-CM | POA: Insufficient documentation

## 2019-03-01 MED ORDER — HUMULIN R U-500 KWIKPEN 500 UNIT/ML ~~LOC~~ SOPN: PEN_INJECTOR | SUBCUTANEOUS | 2 refills | Status: DC

## 2019-03-01 NOTE — Patient Instructions (Signed)

## 2019-03-01 NOTE — Patient Instructions (Signed)
Goals Follow My Plate Eat three meals per day at times discussed Do not skip meals Eat lower carb veggies at lunch and dinner Take insulin as prescribed. Do not take insulin if not eating meal Walk 15 minutes a day Get A1C down to 8% in 3 months. Marland Kitchen

## 2019-03-01 NOTE — Progress Notes (Signed)
Endocrinology follow-up note   Subjective:    Patient ID: Russell Clark, male    DOB: Oct 18, 1965.  he is being seen in follow-up for management of currently uncontrolled symptomatic type 2 diabetes, hyperlipidemia, hypertension. PCP:  Barbie HaggisWilborne, Cynthia, FNP.   Past Medical History:  Diagnosis Date  . Diabetes mellitus, type II (HCC)   . Hyperlipidemia   . Hypertension    Past Surgical History:  Procedure Laterality Date  . OTHER SURGICAL HISTORY     L foot, Cataracts   Social History   Socioeconomic History  . Marital status: Married    Spouse name: Not on file  . Number of children: Not on file  . Years of education: Not on file  . Highest education level: Not on file  Occupational History  . Not on file  Social Needs  . Financial resource strain: Not on file  . Food insecurity    Worry: Not on file    Inability: Not on file  . Transportation needs    Medical: Not on file    Non-medical: Not on file  Tobacco Use  . Smoking status: Former Smoker    Packs/day: 1.00    Years: 25.00    Pack years: 25.00    Types: Cigarettes  . Smokeless tobacco: Never Used  Substance and Sexual Activity  . Alcohol use: No  . Drug use: No  . Sexual activity: Not on file  Lifestyle  . Physical activity    Days per week: Not on file    Minutes per session: Not on file  . Stress: Not on file  Relationships  . Social Musicianconnections    Talks on phone: Not on file    Gets together: Not on file    Attends religious service: Not on file    Active member of club or organization: Not on file    Attends meetings of clubs or organizations: Not on file    Relationship status: Not on file  Other Topics Concern  . Not on file  Social History Narrative  . Not on file   Outpatient Encounter Medications as of 03/01/2019  Medication Sig  . pyridOXINE (VITAMIN B-6) 100 MG tablet Take 100 mg by mouth daily.  Marland Kitchen. buPROPion (WELLBUTRIN SR) 150 MG 12 hr tablet Take 150 mg by mouth 2 (two)  times daily.  . Cholecalciferol (VITAMIN D PO) Take by mouth.  . clopidogrel (PLAVIX) 75 MG tablet Take 75 mg by mouth daily.  . Continuous Blood Gluc Sensor (FREESTYLE LIBRE 14 DAY SENSOR) MISC 1 each by Other route every 14 (fourteen) days.  . Continuous Blood Gluc Sensor (FREESTYLE LIBRE 14 DAY SENSOR) MISC USE AS DIRECTED EVERY 14 DAYS  . fenofibrate (TRICOR) 145 MG tablet Take 1 tablet by mouth once daily  . insulin regular human CONCENTRATED (HUMULIN R U-500 KWIKPEN) 500 UNIT/ML kwikpen Inject 110 units with breakfast, 110 units with lunch, and 110 units with supper when pre-meal blood glucose readings are above 90 mg/dL.  Marland Kitchen. loperamide (IMODIUM A-D) 2 MG tablet Take 12 mg by mouth daily.  Marland Kitchen. losartan-hydrochlorothiazide (HYZAAR) 100-12.5 MG tablet Take 1 tablet by mouth daily.  . metFORMIN (GLUCOPHAGE) 500 MG tablet TAKE 1 TABLET BY MOUTH TWICE DAILY WITH MEALS  . nortriptyline (PAMELOR) 25 MG capsule Take 25 mg by mouth at bedtime.  Marland Kitchen. omeprazole (PRILOSEC) 40 MG capsule Take 40 mg by mouth daily.  . pravastatin (PRAVACHOL) 40 MG tablet Take 1 tablet (40 mg total) by mouth  daily.  . pregabalin (LYRICA) 300 MG capsule Take 300 mg by mouth 3 (three) times daily.   . [DISCONTINUED] furosemide (LASIX) 40 MG tablet Take 40 mg by mouth daily.   . [DISCONTINUED] insulin regular human CONCENTRATED (HUMULIN R U-500 KWIKPEN) 500 UNIT/ML kwikpen Inject 110 units with breakfast, 110 units with lunch, and 100 units with supper when pre-meal blood glucose readings are above 90 mg/dL.   No facility-administered encounter medications on file as of 03/01/2019.     ALLERGIES: Allergies  Allergen Reactions  . Aspirin   . Darvon [Propoxyphene]   . Ibuprofen   . Penicillins     VACCINATION STATUS:  There is no immunization history on file for this patient.  Diabetes He presents for his follow-up diabetic visit. He has type 2 diabetes mellitus. Onset time: He was diagnosed at approximate age of 53  years. His disease course has been worsening. There are no hypoglycemic associated symptoms. Pertinent negatives for hypoglycemia include no confusion, headaches, pallor or seizures. Associated symptoms include polydipsia and polyuria. Pertinent negatives for diabetes include no blurred vision, no chest pain, no fatigue, no polyphagia and no weakness. There are no hypoglycemic complications. Symptoms are worsening. Diabetic complications include peripheral neuropathy. Risk factors for coronary artery disease include dyslipidemia, diabetes mellitus, hypertension, male sex, sedentary lifestyle and tobacco exposure. Current diabetic treatment includes insulin injections and oral agent (dual therapy). He is compliant with treatment most of the time. His weight is increasing steadily. He is following a generally unhealthy diet. When asked about meal planning, he reported none. He never participates in exercise. His home blood glucose trend is increasing steadily. His breakfast blood glucose range is generally >200 mg/dl. His lunch blood glucose range is generally >200 mg/dl. His dinner blood glucose range is generally >200 mg/dl. His bedtime blood glucose range is generally >200 mg/dl. His overall blood glucose range is >200 mg/dl. An ACE inhibitor/angiotensin II receptor blocker is being taken. He sees a podiatrist.Eye exam is current (He is due for his dilated eye exam, promises to do before next visit.).  Hyperlipidemia This is a chronic problem. The current episode started more than 1 year ago. The problem is uncontrolled. Recent lipid tests were reviewed and are high. Exacerbating diseases include diabetes and obesity. Factors aggravating his hyperlipidemia include smoking. Pertinent negatives include no chest pain, myalgias or shortness of breath. Current antihyperlipidemic treatment includes statins. Risk factors for coronary artery disease include dyslipidemia, diabetes mellitus, a sedentary lifestyle and  obesity.  Hypertension This is a chronic problem. The current episode started more than 1 year ago. The problem is controlled. Pertinent negatives include no blurred vision, chest pain, headaches, neck pain, palpitations or shortness of breath. Risk factors for coronary artery disease include dyslipidemia, diabetes mellitus, male gender, sedentary lifestyle and smoking/tobacco exposure. Past treatments include angiotensin blockers.    Review of Systems  Constitutional: Negative for chills, fatigue, fever and unexpected weight change.  HENT: Negative for dental problem, mouth sores and trouble swallowing.   Eyes: Negative for blurred vision and visual disturbance.  Respiratory: Negative for cough, choking, chest tightness, shortness of breath and wheezing.   Cardiovascular: Negative for chest pain, palpitations and leg swelling.  Gastrointestinal: Negative for abdominal distention, abdominal pain, constipation, diarrhea, nausea and vomiting.  Endocrine: Positive for polydipsia and polyuria. Negative for polyphagia.  Genitourinary: Negative for dysuria, flank pain, hematuria and urgency.  Musculoskeletal: Negative for back pain, gait problem, myalgias and neck pain.  Skin: Negative for pallor, rash and wound.  Neurological: Negative for seizures, syncope, weakness, numbness and headaches.  Psychiatric/Behavioral: Negative for confusion and dysphoric mood.    Objective:    BP 131/82   Pulse 86   Ht  (1.676 m)   Wt 235 lb (106.6 kg)   SpO2 100%   BMI 37.93 kg/m   Wt Readings from Last 3 Encounters:  03/01/19 235 lb (106.6 kg)  03/01/19 229 lb (103.9 kg)  11/29/18 229 lb (103.9 kg)     Physical Exam  Constitutional: He is oriented to person, place, and time. He appears well-developed. He is cooperative. No distress.  HENT:  Head: Normocephalic and atraumatic.  Eyes: EOM are normal.  Neck: Normal range of motion. Neck supple. No tracheal deviation present. No thyromegaly  present.  Cardiovascular: Normal rate, S1 normal and S2 normal. Exam reveals no gallop.  No murmur heard. Pulses:      Dorsalis pedis pulses are 0 on the right side and 0 on the left side.       Posterior tibial pulses are 0 on the right side and 0 on the left side.  Pulmonary/Chest: Effort normal. No respiratory distress. He has no wheezes.  Abdominal: He exhibits no distension. There is no abdominal tenderness. There is no guarding and no CVA tenderness.  Musculoskeletal:        General: No edema.     Right shoulder: He exhibits no swelling and no deformity.  Neurological: He is alert and oriented to person, place, and time. He has normal strength and normal reflexes. No cranial nerve deficit or sensory deficit. Gait normal.  Skin: Skin is warm and dry. No rash noted. No cyanosis. Nails show no clubbing.  Psychiatric: He has a normal mood and affect. His speech is normal. Cognition and memory are normal.    Recent Results (from the past 2160 hour(s))  Comprehensive metabolic panel     Status: Abnormal   Collection Time: 02/21/19  9:49 AM  Result Value Ref Range   Glucose 204 (H) 65 - 99 mg/dL   BUN 18 6 - 24 mg/dL   Creatinine, Ser 4.09 (H) 0.76 - 1.27 mg/dL   GFR calc non Af Amer 57 (L) >59 mL/min/1.73   GFR calc Af Amer 66 >59 mL/min/1.73   BUN/Creatinine Ratio 13 9 - 20   Sodium 143 134 - 144 mmol/L   Potassium 4.2 3.5 - 5.2 mmol/L   Chloride 105 96 - 106 mmol/L   CO2 26 20 - 29 mmol/L   Calcium 9.2 8.7 - 10.2 mg/dL   Total Protein 5.8 (L) 6.0 - 8.5 g/dL   Albumin 3.9 3.8 - 4.9 g/dL   Globulin, Total 1.9 1.5 - 4.5 g/dL   Albumin/Globulin Ratio 2.1 1.2 - 2.2   Bilirubin Total 0.3 0.0 - 1.2 mg/dL   Alkaline Phosphatase 91 39 - 117 IU/L   AST 27 0 - 40 IU/L   ALT 37 0 - 44 IU/L  Microalbumin / creatinine urine ratio     Status: None   Collection Time: 02/21/19  9:49 AM  Result Value Ref Range   Creatinine, Urine 100.9 Not Estab. mg/dL   Microalbumin, Urine 81.1 Not Estab.  ug/mL   Microalb/Creat Ratio 14 0 - 29 mg/g creat    Comment:                        Normal:                0 -  29                        Moderately increased: 30 - 300                        Severely increased:       >300               **Please note reference interval change**   Hgb A1c w/o eAG     Status: Abnormal   Collection Time: 02/21/19  9:49 AM  Result Value Ref Range   Hgb A1c MFr Bld 11.1 (H) 4.8 - 5.6 %    Comment:          Prediabetes: 5.7 - 6.4          Diabetes: >6.4          Glycemic control for adults with diabetes: <7.0   T4, free     Status: None   Collection Time: 02/21/19  9:49 AM  Result Value Ref Range   Free T4 1.07 0.82 - 1.77 ng/dL  TSH     Status: None   Collection Time: 02/21/19  9:49 AM  Result Value Ref Range   TSH 1.410 0.450 - 4.500 uIU/mL  VITAMIN D 25 Hydroxy (Vit-D Deficiency, Fractures)     Status: Abnormal   Collection Time: 02/21/19  9:49 AM  Result Value Ref Range   Vit D, 25-Hydroxy 29.7 (L) 30.0 - 100.0 ng/mL    Comment: Vitamin D deficiency has been defined by the Institute of Medicine and an Endocrine Society practice guideline as a level of serum 25-OH vitamin D less than 20 ng/mL (1,2). The Endocrine Society went on to further define vitamin D insufficiency as a level between 21 and 29 ng/mL (2). 1. IOM (Institute of Medicine). 2010. Dietary reference    intakes for calcium and D. [place]: The    [employer]. 2. Holick MF, Binkley [place], Bischoff-Ferrari HA, et al.    Evaluation, treatment, and prevention of vitamin D    deficiency: an Endocrine Society clinical practice    guideline. JCEM. 2011 Jul; 96(7):1911-30.     Assessment & Plan:   1. Uncontrolled type 2 diabetes mellitus with other specified complication, with Kruschke-term current use of insulin (HCC)  - Patient has currently uncontrolled symptomatic type 2 DM since  53 years of age. - He comes in with continued severe hyperglycemia averaging 300+ for  the last 14 days, and previsit labs show A1c of 11%.  -Recent labs reviewed, showing normal renal function,.  -his diabetes is complicated by peripheral neuropathy, peripheral arterial disease,  sedentary life and Celene KrasClaude E Tucciarone remains at a high risk for more acute and chronic complications which include CAD, CVA, CKD, retinopathy, and neuropathy. These are all discussed in detail with the patient.  - I have counseled him on diet management and weight loss, by adopting a carbohydrate restricted/protein rich diet.  - he  admits there is a room for improvement in his diet and drink choices. -  Suggestion is made for him to avoid simple carbohydrates  from his diet including Cakes, Sweet Desserts / Pastries, Ice Cream, Soda (diet and regular), Sweet Tea, Candies, Chips, Cookies, Sweet Pastries,  Store Bought Juices, Alcohol in Excess of  1-2 drinks a day, Artificial Sweeteners, Coffee Creamer, and "Sugar-free" Products. This will help patient to have stable blood glucose profile and potentially avoid unintended weight gain.   -  I encouraged him to switch to  unprocessed or minimally processed complex starch and increased protein intake (animal or plant source), fruits, and vegetables.  - he is advised to stick to a routine mealtimes to eat 3 meals  a day and avoid unnecessary snacks ( to snack only to correct hypoglycemia).    - I have approached him with the following individualized plan to manage diabetes and patient agrees:  -Given his presentation with significant glycemic burden, he will continue to require higher dose of  insulin U500 in order for him to achieve and maintain control of diabetes. -He is advised to increase  insulin Humulin U500  To 110 units , with breakfast, 110 with lunch and keep at 110 units with supper  for  pre-meal blood glucose above 90 mg/dL plus patient specific correction dose for pre-meal blood glucose above 150 mg/dL, associated with documenting of his blood glucose  profile at least 4 times a day-before meals and at bedtime.   -He is advised to continue monitoring blood glucose strictly 4 times a day-before meals and at bedtime. -He is warned not to take insulin  without proper monitoring per orders. -Patient is encouraged to call clinic for blood glucose levels less than 70 or above 300 mg /dl. -He is advised to continue  metformin,  500 mg p.o. twice daily-after breakfast and supper.    - Patient specific target  A1c;  LDL, HDL, Triglycerides, and  Waist Circumference were discussed in detail.  2) BP/HTN: His blood pressure is controlled to target.  He is advised to continue blood pressure medications including Hyzaar.   3) Lipids/HPL: His recent lipid panel showed significantly improved lipid panel triglycerides increasing to 616 from.  He is approached with the fact that better  control of diabetes will help control triglycerides.  He is advised to continue pravastatin 40 mg p.o. Nightly.  He will need additional treatment with fenofibrate 145 mg p.o. nightly.   4)  Weight/Diet: CDE Consult  in progress , exercise, and detailed carbohydrates information provided.  5) Chronic Care/Health Maintenance:  -he  is on ACEI/ARB and Statin medications and  is encouraged to continue to follow up with Ophthalmology (he is significantly overdue), Dentist,  Podiatrist at least yearly or according to recommendations, and advised to  stay away from smoking. I have recommended yearly flu vaccine and pneumonia vaccination at least every 5 years; moderate intensity exercise for up to 150 minutes weekly; and  sleep for at least 7 hours a day.  - I advised patient to maintain close follow up with Barbie HaggisWilborne, Cynthia, FNP for primary care needs.  - Patient Care Time Today:  25 min, of which >50% was spent in reviewing his  current and  previous labs/studies, his glycemic profile, previous treatments, and medications doses and developing a plan for Ting-term care based on the  latest recommendations for standards of care.  Rinaldo Ratellaude E Yutzy participated in the discussions, expressed understanding, and voiced agreement with the above plans.  All questions were answered to his satisfaction. he is encouraged to contact clinic should he have any questions or concerns prior to his return visit.    Follow up plan: - Return in about 4 months (around 07/01/2019) for Follow up with Pre-visit Labs, Meter, and Logs.  Marquis LunchGebre Elliot Meldrum, MD Phone: [phone]  Fax: [phone]   03/01/2019, 11:53 AM   This note was partially dictated with voice recognition software. Similar sounding words can be transcribed inadequately or may not  be  corrected upon review.

## 2019-03-01 NOTE — Progress Notes (Signed)
  Medical Nutrition Therapy:  Appt start time: 0830 end time:  0930  Assessment:  Primary concerns today: Diabetes Type 2. He is divorces and lives with his daughter and grand kids. He is on disability due to neuropathy. A1C 11.1%. He sees Dr. Dorris Fetch, Endocrinology. Is on u 500 110 units with breakfast and lunch and 100 units with dinner.  Eats 2 meals a day, usually skipping lunch and eating a late breakfast.  No physically active. Was taken off of fluid pill due to CKD with Creatine of 1.42. Metfomrin 500 mg BID.   Has not been controlled with his DM for a Chiara time. He notes his habits are hard to break. Has fatigue, increased thirst, frequent urination, depressed affect, no energy and not motivated to do much. Admits to BS being 200-300['s.  Walks a little but gets SOB easily. Has swelling in legs on going. Diet is high in sodum and low in fresh fruits, vegetables and fresh meats. Just got his disability check last week, so his money should help provide for better quality foods. He notes he is willing to make some changes to improve his Dm and reduce complications.   Preferred Learning Style:    No preference indicated   Learning and ready for change  Contemplating  MEDICATIONS: see list   DIETARY INTAKE: .    24-hr recall:  B ( AM): sausage biscuit, water  Snk ( AM):   L ( PM): skips often not hungry; chick fila sandwich, water Snk ( PM):  D ( PM): oyster stew, 2 cups, water Snk ( PM):  Beverages: water  Usual physical activity: ADL   Estimated energy needs: 1800  calories 200 g carbohydrates 135 g protein 50 g fat  Progress Towards Goal(s):  In progress.   Nutritional Diagnosis:  NB-1.1 Food and nutrition-related knowledge deficit As related to Diabetes Type 2.  As evidenced by A1C 11.1%.    Intervention:  Nutrition and Diabetes education provided on My Plate, CHO counting, meal planning, portion sizes, timing of meals, avoiding snacks between meals unless having a  low blood sugar, target ranges for A1C and blood sugars, signs/symptoms and treatment of hyper/hypoglycemia, monitoring blood sugars, taking medications as prescribed, benefits of exercising 30 minutes per day and prevention of complications of DM.  Goals Follow My Plate Eat three meals per day at times discussed Do not skip meals Eat lower carb veggies at lunch and dinner Take insulin as prescribed. Do not take insulin if not eating meal Walk 15 minutes a day Get A1C down to 8% in 3 months. .  Teaching Method Utilized: Telephone Verbal Visual Auditory  Handouts given during visit include:  Verbally went over My Plate   Barriers to learning/adherence to lifestyle change: none  Demonstrated degree of understanding via:  Teach Back   Monitoring/Evaluation:  Dietary intake, exercise, , and body weight in 1 month(s).

## 2019-03-29 ENCOUNTER — Other Ambulatory Visit: Payer: Self-pay

## 2019-03-29 ENCOUNTER — Encounter: Payer: Self-pay | Admitting: Nutrition

## 2019-03-29 ENCOUNTER — Encounter: Payer: Medicaid - Out of State | Attending: Nurse Practitioner | Admitting: Nutrition

## 2019-03-29 VITALS — Ht 68.0 in | Wt 235.0 lb

## 2019-03-29 DIAGNOSIS — IMO0002 Reserved for concepts with insufficient information to code with codable children: Secondary | ICD-10-CM

## 2019-03-29 DIAGNOSIS — E669 Obesity, unspecified: Secondary | ICD-10-CM

## 2019-03-29 DIAGNOSIS — E118 Type 2 diabetes mellitus with unspecified complications: Secondary | ICD-10-CM | POA: Insufficient documentation

## 2019-03-29 DIAGNOSIS — E1165 Type 2 diabetes mellitus with hyperglycemia: Secondary | ICD-10-CM | POA: Insufficient documentation

## 2019-03-29 DIAGNOSIS — N183 Chronic kidney disease, stage 3 (moderate): Secondary | ICD-10-CM | POA: Insufficient documentation

## 2019-03-29 NOTE — Progress Notes (Signed)
  Medical Nutrition Therapy:  Appt start time: 1200 end time:  1230 Assessment:  Primary concerns today: Diabetes Type 2. He is divorces and lives with his daughter and grand kids   He is on disability due to neuropathy. A1C 11.1%. He sees Dr. Dorris Fetch, Endocrinology. Is on u U 500- 110 units with breakfast and lunch and dinner. Has a lot of swelling of his legs. Has been to [facility] for testing for his heart and the fluid in his legs. He notes he has circulation problems for the last year. He notes he is on Lyrica and per his heart MD it may effect fluid retention. Has had to stop some BP and fluid pills due to kidney issues. Has the [place]. BS are highest before breakfast and lunch. FBS 180-260's  BL) 200's BD) 200's Hasn't been  BS was low and ate a honey bun and juice Felt nauseated last few days. Feels better today. Cares for his 6 month old grandson. He notes has sores on his lower legs weep Had been in ER for 2 days for spider bite in June 2020. It was MRSA he notes.      Preferred Learning Style:    No preference indicated   Learning and ready for change  Contemplating  MEDICATIONS: see list   DIETARY INTAKE: .    24-hr recall:  B ( A 2 scrambled eggs and 1 slice toast, water  Snk ( AM):   L ( PM): ham sandwich, water,  Snk ( PM D)  Grilled chicken, corn, butter beans, salad and cabbage, water. Beverages: water  Usual physical activity: ADL   Estimated energy needs: 1800  calories 200 g carbohydrates 135 g protein 50 g fat  Progress Towards Goal(s):  In progress.   Nutritional Diagnosis:  NB-1.1 Food and nutrition-related knowledge deficit As related to Diabetes Type 2.  As evidenced by A1C 11.1%.    Intervention:  Nutrition and Diabetes education provided on My Plate, CHO counting, meal planning, portion sizes, timing of meals, avoiding snacks between meals unless having a low blood sugar, target ranges for A1C and blood sugars, signs/symptoms and treatment of  hyper/hypoglycemia, monitoring blood sugars, taking medications as prescribed, benefits of exercising 30 minutes per day and prevention of complications of DM.  Teaching Method Utilized: Telephone Verbal   Goals Increase lower carb veggies with lunch to 2 svgs Eat 45 grams of carbs per meal Keep drinking water Increase movement as tolerated. Get FBS less than 150 and less than 180 before bed. Ask Cardiologist if Torsamide would be of benefit for fluid in legs. Wash legs daily to keep skin clean . Get A1C down to 7%    Handouts given during visit include:  Verbally went over My Plate   Barriers to learning/adherence to lifestyle change: none  Demonstrated degree of understanding via:  Teach Back   Monitoring/Evaluation:  Dietary intake, exercise, , and body weight in 3 month(s).

## 2019-03-29 NOTE — Patient Instructions (Addendum)
Goals Increase lower carb veggies with lunch to 2 svgs Eat 45 grams of carbs per meal Keep drinking water Increase movement as tolerated. Get FBS less than 150 and less than 180 before bed. Ask Cardiologist if Torsamide would be of benefit for fluid in legs. Wash legs daily to keep skin clean . Get A1C down to 7%

## 2019-04-18 ENCOUNTER — Other Ambulatory Visit: Payer: Self-pay | Admitting: "Endocrinology

## 2019-05-13 ENCOUNTER — Other Ambulatory Visit: Payer: Self-pay | Admitting: "Endocrinology

## 2019-06-23 ENCOUNTER — Other Ambulatory Visit: Payer: Self-pay | Admitting: "Endocrinology

## 2019-06-24 LAB — COMPREHENSIVE METABOLIC PANEL
ALT: 73 IU/L — ABNORMAL HIGH (ref 0–44)
AST: 44 IU/L — ABNORMAL HIGH (ref 0–40)
Albumin/Globulin Ratio: 2.1 (ref 1.2–2.2)
Albumin: 3.9 g/dL (ref 3.8–4.9)
Alkaline Phosphatase: 225 IU/L — ABNORMAL HIGH (ref 39–117)
BUN/Creatinine Ratio: 13 (ref 9–20)
BUN: 16 mg/dL (ref 6–24)
Bilirubin Total: 0.5 mg/dL (ref 0.0–1.2)
CO2: 21 mmol/L (ref 20–29)
Calcium: 8.9 mg/dL (ref 8.7–10.2)
Chloride: 100 mmol/L (ref 96–106)
Creatinine, Ser: 1.27 mg/dL (ref 0.76–1.27)
GFR calc Af Amer: 75 mL/min/{1.73_m2} (ref >59)
GFR calc non Af Amer: 65 mL/min/{1.73_m2} (ref >59)
Globulin, Total: 1.9 g/dL (ref 1.5–4.5)
Glucose: 410 mg/dL — ABNORMAL HIGH (ref 65–99)
Potassium: 4.6 mmol/L (ref 3.5–5.2)
Sodium: 135 mmol/L (ref 134–144)
Total Protein: 5.8 g/dL — ABNORMAL LOW (ref 6.0–8.5)

## 2019-06-24 LAB — SPECIMEN STATUS REPORT

## 2019-06-24 LAB — HGB A1C W/O EAG: Hgb A1c MFr Bld: 10.6 % — ABNORMAL HIGH (ref 4.8–5.6)

## 2019-07-01 ENCOUNTER — Other Ambulatory Visit: Payer: Self-pay

## 2019-07-01 ENCOUNTER — Encounter: Payer: Self-pay | Admitting: "Endocrinology

## 2019-07-01 ENCOUNTER — Ambulatory Visit (INDEPENDENT_AMBULATORY_CARE_PROVIDER_SITE_OTHER): Payer: Medicaid - Out of State | Admitting: "Endocrinology

## 2019-07-01 DIAGNOSIS — E782 Mixed hyperlipidemia: Secondary | ICD-10-CM

## 2019-07-01 DIAGNOSIS — E1165 Type 2 diabetes mellitus with hyperglycemia: Secondary | ICD-10-CM | POA: Diagnosis not present

## 2019-07-01 DIAGNOSIS — E6609 Other obesity due to excess calories: Secondary | ICD-10-CM

## 2019-07-01 DIAGNOSIS — Z6832 Body mass index (BMI) 32.0-32.9, adult: Secondary | ICD-10-CM

## 2019-07-01 DIAGNOSIS — I10 Essential (primary) hypertension: Secondary | ICD-10-CM | POA: Diagnosis not present

## 2019-07-01 MED ORDER — GLIPIZIDE ER 5 MG PO TB24: 5.0000 mg | ORAL_TABLET | Freq: Every day | ORAL | 3 refills | Status: DC

## 2019-07-01 MED ORDER — HUMULIN R U-500 KWIKPEN 500 UNIT/ML ~~LOC~~ SOPN: PEN_INJECTOR | SUBCUTANEOUS | 2 refills | Status: DC

## 2019-07-01 NOTE — Progress Notes (Signed)
07/01/2019                                                    Endocrinology Telehealth Visit Follow up Note -During COVID -19 Pandemic  This visit type was conducted due to national recommendations for restrictions regarding the COVID-19 Pandemic  in an effort to limit this patient's exposure and mitigate transmission of the corona virus.  Due to his co-morbid illnesses, Russell Clark is at  moderate to high risk for complications without adequate follow up.  This format is felt to be most appropriate for him at this time.  I connected with this patient on 07/01/2019   by telephone and verified that I am speaking with the correct person using two identifiers. Russell Clark, Dec 17, 1965. he has verbally consented to this visit. All issues noted in this document were discussed and addressed. The format was not optimal for physical exam.    Subjective:    Patient ID: Russell Clark, male    DOB: [birth year].  he is being engaged in telehealth via telephone in follow-up for management of currently uncontrolled symptomatic type 2 diabetes, hyperlipidemia, hypertension. PCP:  Barbie Haggis, FNP.   Past Medical History:  Diagnosis Date  . Diabetes mellitus, type II (HCC)   . Hyperlipidemia   . Hypertension    Past Surgical History:  Procedure Laterality Date  . OTHER SURGICAL HISTORY     L foot, Cataracts   Social History   Socioeconomic History  . Marital status: Married    Spouse name: Not on file  . Number of children: Not on file  . Years of education: Not on file  . Highest education level: Not on file  Occupational History  . Not on file  Social Needs  . Financial resource strain: Not on file  . Food insecurity    Worry: Not on file    Inability: Not on file  . Transportation needs    Medical: Not on file    Non-medical: Not on file  Tobacco Use  . Smoking status: Former Smoker    Packs/day: 1.00    Years: 25.00    Pack years: 25.00    Types: Cigarettes  .  Smokeless tobacco: Never Used  Substance and Sexual Activity  . Alcohol use: No  . Drug use: No  . Sexual activity: Not on file  Lifestyle  . Physical activity    Days per week: Not on file    Minutes per session: Not on file  . Stress: Not on file  Relationships  . Social Musician on phone: Not on file    Gets together: Not on file    Attends religious service: Not on file    Active member of club or organization: Not on file    Attends meetings of clubs or organizations: Not on file    Relationship status: Not on file  Other Topics Concern  . Not on file  Social History Narrative  . Not on file   Outpatient Encounter Medications as of 07/01/2019  Medication Sig  . buPROPion (WELLBUTRIN SR) 150 MG 12 hr tablet Take 150 mg by mouth 2 (two) times daily.  . Cholecalciferol (VITAMIN D PO) Take by mouth.  . clopidogrel (PLAVIX) 75 MG tablet Take 75 mg by mouth daily.  Marland Kitchen  Continuous Blood Gluc Sensor (FREESTYLE LIBRE 14 DAY SENSOR) MISC 1 each by Other route every 14 (fourteen) days.  . Continuous Blood Gluc Sensor (FREESTYLE LIBRE 14 DAY SENSOR) MISC USE AS DIRECTED EVERY 14  DAYS  . fenofibrate (TRICOR) 145 MG tablet Take 1 tablet by mouth once daily  . glipiZIDE (GLUCOTROL XL) 5 MG 24 hr tablet Take 1 tablet (5 mg total) by mouth daily with breakfast.  . insulin regular human CONCENTRATED (HUMULIN R U-500 KWIKPEN) 500 UNIT/ML kwikpen Inject 120 units with breakfast, 120 units with lunch, and 120 units with supper when pre-meal blood glucose readings are above 90 mg/dL.  Marland Kitchen loperamide (IMODIUM A-D) 2 MG tablet Take 12 mg by mouth daily.  Marland Kitchen losartan-hydrochlorothiazide (HYZAAR) 100-12.5 MG tablet Take 1 tablet by mouth daily.  . metFORMIN (GLUCOPHAGE) 500 MG tablet TAKE 1 TABLET BY MOUTH TWICE DAILY WITH MEALS  . nortriptyline (PAMELOR) 25 MG capsule Take 25 mg by mouth at bedtime.  Marland Kitchen omeprazole (PRILOSEC) 40 MG capsule Take 40 mg by mouth daily.  . pravastatin (PRAVACHOL)  40 MG tablet Take 1 tablet (40 mg total) by mouth daily.  . pregabalin (LYRICA) 300 MG capsule Take 300 mg by mouth 3 (three) times daily.   Marland Kitchen pyridOXINE (VITAMIN B-6) 100 MG tablet Take 100 mg by mouth daily.  . [DISCONTINUED] insulin regular human CONCENTRATED (HUMULIN R U-500 KWIKPEN) 500 UNIT/ML kwikpen Inject 110 units with breakfast, 110 units with lunch, and 110 units with supper when pre-meal blood glucose readings are above 90 mg/dL.   No facility-administered encounter medications on file as of 07/01/2019.     ALLERGIES: Allergies  Allergen Reactions  . Aspirin   . Darvon [Propoxyphene]   . Ibuprofen   . Penicillins     VACCINATION STATUS:  There is no immunization history on file for this patient.  Diabetes He presents for his follow-up diabetic visit. He has type 2 diabetes mellitus. Onset time: He was diagnosed at approximate age of 41 years. His disease course has been worsening. There are no hypoglycemic associated symptoms. Pertinent negatives for hypoglycemia include no confusion, headaches, pallor or seizures. Associated symptoms include polydipsia and polyuria. Pertinent negatives for diabetes include no blurred vision, no chest pain, no fatigue, no polyphagia and no weakness. There are no hypoglycemic complications. Symptoms are improving. Diabetic complications include peripheral neuropathy. Risk factors for coronary artery disease include dyslipidemia, diabetes mellitus, hypertension, male sex, sedentary lifestyle and tobacco exposure. Current diabetic treatment includes insulin injections and oral agent (dual therapy). He is compliant with treatment most of the time. His weight is increasing steadily. He is following a generally unhealthy diet. When asked about meal planning, he reported none. He never participates in exercise. His home blood glucose trend is increasing steadily. His breakfast blood glucose range is generally >200 mg/dl. His lunch blood glucose range is  generally >200 mg/dl. His dinner blood glucose range is generally >200 mg/dl. His bedtime blood glucose range is generally >200 mg/dl. His overall blood glucose range is >200 mg/dl. An ACE inhibitor/angiotensin II receptor blocker is being taken. He sees a podiatrist.Eye exam is current (He is due for his dilated eye exam, promises to do before next visit.).  Hyperlipidemia This is a chronic problem. The current episode started more than 1 year ago. The problem is uncontrolled. Recent lipid tests were reviewed and are high. Exacerbating diseases include diabetes and obesity. Factors aggravating his hyperlipidemia include smoking. Pertinent negatives include no chest pain, myalgias or shortness of breath.  Current antihyperlipidemic treatment includes statins. Risk factors for coronary artery disease include dyslipidemia, diabetes mellitus, a sedentary lifestyle and obesity.  Hypertension This is a chronic problem. The current episode started more than 1 year ago. The problem is controlled. Pertinent negatives include no blurred vision, chest pain, headaches, neck pain, palpitations or shortness of breath. Risk factors for coronary artery disease include dyslipidemia, diabetes mellitus, male gender, sedentary lifestyle and smoking/tobacco exposure. Past treatments include angiotensin blockers.      Objective:    There were no vitals taken for this visit.  Wt Readings from Last 3 Encounters:  03/29/19 235 lb (106.6 kg)  03/01/19 235 lb (106.6 kg)  03/01/19 229 lb (103.9 kg)       Recent Results (from the past 2160 hour(s))  Comprehensive metabolic panel     Status: Abnormal   Collection Time: 06/23/19 10:12 AM  Result Value Ref Range   Glucose 410 (H) 65 - 99 mg/dL   BUN 16 6 - 24 mg/dL   Creatinine, Ser 1.27 0.76 - 1.27 mg/dL   GFR calc non Af Amer 65 >59 mL/min/1.73   GFR calc Af Amer 75 >59 mL/min/1.73   BUN/Creatinine Ratio 13 9 - 20   Sodium 135 134 - 144 mmol/L   Potassium 4.6 3.5 -  5.2 mmol/L   Chloride 100 96 - 106 mmol/L   CO2 21 20 - 29 mmol/L   Calcium 8.9 8.7 - 10.2 mg/dL   Total Protein 5.8 (L) 6.0 - 8.5 g/dL   Albumin 3.9 3.8 - 4.9 g/dL   Globulin, Total 1.9 1.5 - 4.5 g/dL   Albumin/Globulin Ratio 2.1 1.2 - 2.2   Bilirubin Total 0.5 0.0 - 1.2 mg/dL   Alkaline Phosphatase 225 (H) 39 - 117 IU/L   AST 44 (H) 0 - 40 IU/L   ALT 73 (H) 0 - 44 IU/L  Hgb A1c w/o eAG     Status: Abnormal   Collection Time: 06/23/19 10:12 AM  Result Value Ref Range   Hgb A1c MFr Bld 10.6 (H) 4.8 - 5.6 %    Comment:          Prediabetes: 5.7 - 6.4          Diabetes: >6.4          Glycemic control for adults with diabetes: <7.0   Specimen status report     Status: None   Collection Time: 06/23/19 10:12 AM  Result Value Ref Range   specimen status report Comment     Comment: Isac Caddy CMP14 Default Ambig Abbrev CMP14 Default A hand-written panel/profile was received from your office. In accordance with the [facility] Ambiguous Test Code Policy dated July 0258, we have completed your order by using the closest currently or formerly recognized AMA panel.  We have assigned Comprehensive Metabolic Panel (14), Test Code #322000 to this request.  If this is not the testing you wished to receive on this specimen, please contact the [facility] Client Inquiry/Technical Services Department to clarify the test order.  We appreciate your business.     Assessment & Plan:   1. Uncontrolled type 2 diabetes mellitus with other specified complication, with [place]-term current use of insulin ([place])  - Patient has currently uncontrolled symptomatic type 2 DM since  53 years of age. - He comes in with slightly better, however still significantly above target glycemic profile.  His fasting readings range from 241-358, lunchtime between 235-HI, predinner between 194-HI, bedtime between 172-394.  This is despite 330  units of insulin U500 daily.  His previsit labs show A1c of 10.6% slightly improving  from 11.1%.    -Recent labs reviewed, showing normal renal function,.  -his diabetes is complicated by peripheral neuropathy, peripheral arterial disease,  sedentary life and Rachid E Calvario remains at a high risk for more acute and chronic complications which include CAD, CVA, CKD, retinopathy, and neuropathy. These are all discussed in detail with the patient.  - I have counseled him on diet management and weight loss, by adopting a carbohydrate restricted/protein rich diet.  - he  admits there is a room for improvement in his diet and drink choices. -  Suggestion is made for him to avoid simple carbohydrates  from his diet including Cakes, Sweet Desserts / Pastries, Ice Cream, Soda (diet and regular), Sweet Tea, Candies, Chips, Cookies, Sweet Pastries,  Store Bought Juices, Alcohol in Excess of  1-2 drinks a day, Artificial Sweeteners, Coffee Creamer, and "Sugar-free" Products. This will help patient to have stable blood glucose profile and potentially avoid unintended weight gain.  - I encouraged him to switch to  unprocessed or minimally processed complex starch and increased protein intake (animal or plant source), fruits, and vegetables.  - he is advised to stick to a routine mealtimes to eat 3 meals  a day and avoid unnecessary snacks ( to snack only to correct hypoglycemia).    - I have approached him with the following individualized plan to manage diabetes and patient agrees:  -Given his presentation with significant glycemic burden, he will continue to require higher dose of  insulin U500 in order for him to achieve and maintain control of diabetes. -He has extremely high degree of insulin resistance. -He is advised to increase insulin Humulin U500 to 120 units 3 times a day with meals, for pre-meal blood glucose readings above 90 mg/dl.  -He is advised to continue documenting blood glucose at least 4 times a day using his libre CGM device.  -He is warned not to take insulin  without  proper monitoring per orders. -Patient is encouraged to call clinic for blood glucose levels less than 70 or above 300 mg /dl. -He is advised to continue  metformin,  500 mg p.o. twice daily-after breakfast and supper.   - He may benefit from low-dose glipizide therapy.  I discussed and added glipizide 5 mg XL p.o. daily at breakfast.  - Patient specific target  A1c;  LDL, HDL, Triglycerides, and  Waist Circumference were discussed in detail.  2) BP/HTN: he is advised to home monitor blood pressure and report if > 140/90 on 2 separate readings.  He is advised to continue blood pressure medications including Hyzaar.   3) Lipids/HPL: His recent lipid panel showed significantly improved lipid panel triglycerides increasing to 616 from.  He is approached with the fact that better  control of diabetes will help control triglycerides.  He is advised to continue pravastatin 40 mg p.o. nightly, as well as fenofibrate 140 mg p.o. nightly.   4)  Weight/Diet: CDE Consult  in progress , exercise, and detailed carbohydrates information provided.  5) Chronic Care/Health Maintenance:  -he  is on ACEI/ARB and Statin medications and  is encouraged to continue to follow up with Ophthalmology (he is significantly overdue), Dentist,  Podiatrist at least yearly or according to recommendations, and advised to  stay away from smoking. I have recommended yearly flu vaccine and pneumonia vaccination at least every 5 years; moderate intensity exercise for up to 150 minutes weekly;  and  sleep for at least 7 hours a day.  - I advised patient to maintain close follow up with Barbie HaggisWilborne, Cynthia, FNP for primary care needs.  - Patient Care Time Today:  25 min, of which >50% was spent in  counseling and the rest reviewing his  current and  previous labs/studies, previous treatments, his blood glucose readings, and medications' doses and developing a plan for [place]-term care based on the latest recommendations for standards of  care.   Russell Ratellaude E Broaddus participated in the discussions, expressed understanding, and voiced agreement with the above plans.  All questions were answered to his satisfaction. he is encouraged to contact clinic should he have any questions or concerns prior to his return visit.   Follow up plan: - Return in about 4 weeks (around 07/29/2019) for Follow up with Meter and Logs Only - no Labs, Include 8 log sheets.  Marquis LunchGebre Jasmyn Picha, MD Phone: [phone]  Fax: [phone]   07/01/2019, 10:39 AM   This note was partially dictated with voice recognition software. Similar sounding words can be transcribed inadequately or may not  be corrected upon review.

## 2019-07-05 ENCOUNTER — Encounter: Payer: Medicaid - Out of State | Attending: Nurse Practitioner | Admitting: Nutrition

## 2019-07-05 ENCOUNTER — Encounter: Payer: Self-pay | Admitting: Nutrition

## 2019-07-05 ENCOUNTER — Other Ambulatory Visit: Payer: Self-pay

## 2019-07-05 VITALS — Ht 68.0 in | Wt 230.0 lb

## 2019-07-05 DIAGNOSIS — E669 Obesity, unspecified: Secondary | ICD-10-CM

## 2019-07-05 DIAGNOSIS — E1165 Type 2 diabetes mellitus with hyperglycemia: Secondary | ICD-10-CM | POA: Insufficient documentation

## 2019-07-05 DIAGNOSIS — IMO0002 Reserved for concepts with insufficient information to code with codable children: Secondary | ICD-10-CM

## 2019-07-05 DIAGNOSIS — E118 Type 2 diabetes mellitus with unspecified complications: Secondary | ICD-10-CM | POA: Insufficient documentation

## 2019-07-05 DIAGNOSIS — N183 Chronic kidney disease, stage 3 unspecified: Secondary | ICD-10-CM

## 2019-07-05 NOTE — Patient Instructions (Signed)
Goals Eating meals on time  Avoid skipping meal Increase lower carb vegetables. Get A1C down to 8% in 3 months. Lose 10 lbs in the next 3 months.

## 2019-07-05 NOTE — Progress Notes (Signed)
  Medical Nutrition Therapy:  Appt start time: 1015end time:  1030 Assessment:  Primary concerns today: Diabetes Type 2. He is divorces and lives with his daughter and grand kids. Changes made: working on portion sizes, increase fresh fruits and vegetables. Talked to Dr. Dorris Fetch,  Last week.  U500 120 units with each meal and Glipizide 5 mg per day.  Getting  Vein surgery tomorrow. Calves are weeping. Feels better. Will start walking when he is allowed. Trying to avoid processed foods but still has some higher sodium foods. Lab Results  Component Value Date   HGBA1C 10.6 (H) 06/23/2019    Preferred Learning Style:    No preference indicated   Learning and ready for change  Contemplating  MEDICATIONS: see list   DIETARY INTAKE: .    24-hr recall:  B ( A 2 scrambled eggs and 1 slice toast, water bacon egg sandwich, eater Snk ( AM):   L ( PM):  Skipped. ; sandwich with fruit, water Snk ( PM D)  [place] steak, green beans, mac/cheese, lima beans, water,  Beverages: water  Usual physical activity: ADL   Estimated energy needs: 1800  calories 200 g carbohydrates 135 g protein 50 g fat  Progress Towards Goal(s):  In progress.   Nutritional Diagnosis:  NB-1.1 Food and nutrition-related knowledge deficit As related to Diabetes Type 2.  As evidenced by A1C 11.1%.    Intervention:  Nutrition and Diabetes education provided on My Plate, CHO counting, meal planning, portion sizes, timing of meals, avoiding snacks between meals unless having a low blood sugar, target ranges for A1C and blood sugars, signs/symptoms and treatment of hyper/hypoglycemia, monitoring blood sugars, taking medications as prescribed, benefits of exercising 30 minutes per day and prevention of complications of DM.  Teaching Method Utilized: Telephone Verbal   Goals Eating meals on time  Avoid skipping meal Increase lower carb vegetables. Get A1C down to 8% in 3 months. Lose 10 lbs in the next 3  months.   Handouts given during visit include:  Verbally went over My Plate   Barriers to learning/adherence to lifestyle change: none  Demonstrated degree of understanding via:  Teach Back   Monitoring/Evaluation:  Dietary intake, exercise, , and body weight in 3 month(s).

## 2019-08-01 ENCOUNTER — Other Ambulatory Visit: Payer: Self-pay

## 2019-08-01 ENCOUNTER — Ambulatory Visit (INDEPENDENT_AMBULATORY_CARE_PROVIDER_SITE_OTHER): Payer: Medicaid - Out of State | Admitting: "Endocrinology

## 2019-08-01 ENCOUNTER — Encounter: Payer: Self-pay | Admitting: "Endocrinology

## 2019-08-01 DIAGNOSIS — E1165 Type 2 diabetes mellitus with hyperglycemia: Secondary | ICD-10-CM | POA: Diagnosis not present

## 2019-08-01 DIAGNOSIS — E782 Mixed hyperlipidemia: Secondary | ICD-10-CM | POA: Diagnosis not present

## 2019-08-01 DIAGNOSIS — I10 Essential (primary) hypertension: Secondary | ICD-10-CM | POA: Diagnosis not present

## 2019-08-01 NOTE — Progress Notes (Signed)
08/01/2019                                                    Endocrinology Telehealth Visit Follow up Note -During COVID -19 Pandemic  This visit type was conducted due to national recommendations for restrictions regarding the COVID-19 Pandemic  in an effort to limit this patient's exposure and mitigate transmission of the corona virus.  Due to his co-morbid illnesses, Russell Clark is at  moderate to high risk for complications without adequate follow up.  This format is felt to be most appropriate for him at this time.  I connected with this patient on 08/01/2019   by telephone and verified that I am speaking with the correct person using two identifiers. Russell Clark, 1972/01/20. he has verbally consented to this visit. All issues noted in this document were discussed and addressed. The format was not optimal for physical exam.    Subjective:    Patient ID: Russell Clark, male    DOB: 1966/06/02.  he is being engaged in telehealth via telephone in follow-up for management of currently uncontrolled symptomatic type 2 diabetes, hyperlipidemia, hypertension. PCP:  Garald Braver   Past Medical History:  Diagnosis Date  . Diabetes mellitus, type II (HCC)   . Hyperlipidemia   . Hypertension    Past Surgical History:  Procedure Laterality Date  . OTHER SURGICAL HISTORY     L foot, Cataracts   Social History   Socioeconomic History  . Marital status: Married    Spouse name: Not on file  . Number of children: Not on file  . Years of education: Not on file  . Highest education level: Not on file  Occupational History  . Not on file  Social Needs  . Financial resource strain: Not on file  . Food insecurity    Worry: Not on file    Inability: Not on file  . Transportation needs    Medical: Not on file    Non-medical: Not on file  Tobacco Use  . Smoking status: Former Smoker    Packs/day: 1.00    Years: 25.00    Pack years: 25.00    Types: Cigarettes  . Smokeless  tobacco: Never Used  Substance and Sexual Activity  . Alcohol use: No  . Drug use: No  . Sexual activity: Not on file  Lifestyle  . Physical activity    Days per week: Not on file    Minutes per session: Not on file  . Stress: Not on file  Relationships  . Social Musician on phone: Not on file    Gets together: Not on file    Attends religious service: Not on file    Active member of club or organization: Not on file    Attends meetings of clubs or organizations: Not on file    Relationship status: Not on file  Other Topics Concern  . Not on file  Social History Narrative  . Not on file   Outpatient Encounter Medications as of 08/01/2019  Medication Sig  . buPROPion (WELLBUTRIN SR) 150 MG 12 hr tablet Take 150 mg by mouth 2 (two) times daily.  . Cholecalciferol (VITAMIN D PO) Take by mouth.  . clopidogrel (PLAVIX) 75 MG tablet Take 75 mg by mouth daily.  Marland Kitchen  Continuous Blood Gluc Sensor (FREESTYLE LIBRE 14 DAY SENSOR) MISC 1 each by Other route every 14 (fourteen) days.  . Continuous Blood Gluc Sensor (FREESTYLE LIBRE 14 DAY SENSOR) MISC USE AS DIRECTED EVERY 14  DAYS  . fenofibrate (TRICOR) 145 MG tablet Take 1 tablet by mouth once daily  . glipiZIDE (GLUCOTROL XL) 5 MG 24 hr tablet Take 1 tablet (5 mg total) by mouth daily with breakfast.  . insulin regular human CONCENTRATED (HUMULIN R U-500 KWIKPEN) 500 UNIT/ML kwikpen Inject 120 units with breakfast, 120 units with lunch, and 120 units with supper when pre-meal blood glucose readings are above 90 mg/dL.  Marland Kitchen loperamide (IMODIUM A-D) 2 MG tablet Take 12 mg by mouth daily.  Marland Kitchen losartan-hydrochlorothiazide (HYZAAR) 100-12.5 MG tablet Take 1 tablet by mouth daily.  . metFORMIN (GLUCOPHAGE) 500 MG tablet TAKE 1 TABLET BY MOUTH TWICE DAILY WITH MEALS  . nortriptyline (PAMELOR) 25 MG capsule Take 25 mg by mouth at bedtime.  Marland Kitchen omeprazole (PRILOSEC) 40 MG capsule Take 40 mg by mouth daily.  . pravastatin (PRAVACHOL) 40 MG  tablet Take 1 tablet (40 mg total) by mouth daily.  . pregabalin (LYRICA) 300 MG capsule Take 300 mg by mouth 3 (three) times daily.   Marland Kitchen pyridOXINE (VITAMIN B-6) 100 MG tablet Take 100 mg by mouth daily.   No facility-administered encounter medications on file as of 08/01/2019.     ALLERGIES: Allergies  Allergen Reactions  . Aspirin   . Darvon [Propoxyphene]   . Ibuprofen   . Penicillins     VACCINATION STATUS:  There is no immunization history on file for this patient.  Diabetes He presents for his follow-up diabetic visit. He has type 2 diabetes mellitus. Onset time: He was diagnosed at approximate age of 41 years. His disease course has been worsening. There are no hypoglycemic associated symptoms. Pertinent negatives for hypoglycemia include no confusion, headaches, pallor or seizures. Pertinent negatives for diabetes include no blurred vision, no chest pain, no fatigue, no polydipsia, no polyphagia, no polyuria and no weakness. There are no hypoglycemic complications. Symptoms are improving. Diabetic complications include peripheral neuropathy. Risk factors for coronary artery disease include dyslipidemia, diabetes mellitus, hypertension, male sex, sedentary lifestyle and tobacco exposure. Current diabetic treatment includes insulin injections and oral agent (dual therapy). He is compliant with treatment most of the time. His weight is fluctuating minimally. He is following a generally unhealthy diet. When asked about meal planning, he reported none. He never participates in exercise. His home blood glucose trend is increasing steadily. His breakfast blood glucose range is generally 130-140 mg/dl. His lunch blood glucose range is generally 140-180 mg/dl. His dinner blood glucose range is generally 140-180 mg/dl. His bedtime blood glucose range is generally 140-180 mg/dl. His overall blood glucose range is 140-180 mg/dl. An ACE inhibitor/angiotensin II receptor blocker is being taken. He  sees a podiatrist.Eye exam is current (He is due for his dilated eye exam, promises to do before next visit.).  Hyperlipidemia This is a chronic problem. The current episode started more than 1 year ago. The problem is uncontrolled. Recent lipid tests were reviewed and are high. Exacerbating diseases include diabetes and obesity. Factors aggravating his hyperlipidemia include smoking. Pertinent negatives include no chest pain, myalgias or shortness of breath. Current antihyperlipidemic treatment includes statins. Risk factors for coronary artery disease include dyslipidemia, diabetes mellitus, a sedentary lifestyle and obesity.  Hypertension This is a chronic problem. The current episode started more than 1 year ago. The problem is  controlled. Pertinent negatives include no blurred vision, chest pain, headaches, neck pain, palpitations or shortness of breath. Risk factors for coronary artery disease include dyslipidemia, diabetes mellitus, male gender, sedentary lifestyle and smoking/tobacco exposure. Past treatments include angiotensin blockers.   Review of systems: Limited as above.   Objective:    There were no vitals taken for this visit.  Wt Readings from Last 3 Encounters:  07/05/19 230 lb (104.3 kg)  03/29/19 235 lb (106.6 kg)  03/01/19 235 lb (106.6 kg)       Recent Results (from the past 2160 hour(s))  Comprehensive metabolic panel     Status: Abnormal   Collection Time: 06/23/19 10:12 AM  Result Value Ref Range   Glucose 410 (H) 65 - 99 mg/dL   BUN 16 6 - 24 mg/dL   Creatinine, Ser 1.27 0.76 - 1.27 mg/dL   GFR calc non Af Amer 65 >59 mL/min/1.73   GFR calc Af Amer 75 >59 mL/min/1.73   BUN/Creatinine Ratio 13 9 - 20   Sodium 135 134 - 144 mmol/L   Potassium 4.6 3.5 - 5.2 mmol/L   Chloride 100 96 - 106 mmol/L   CO2 21 20 - 29 mmol/L   Calcium 8.9 8.7 - 10.2 mg/dL   Total Protein 5.8 (L) 6.0 - 8.5 g/dL   Albumin 3.9 3.8 - 4.9 g/dL   Globulin, Total 1.9 1.5 - 4.5 g/dL    Albumin/Globulin Ratio 2.1 1.2 - 2.2   Bilirubin Total 0.5 0.0 - 1.2 mg/dL   Alkaline Phosphatase 225 (H) 39 - 117 IU/L   AST 44 (H) 0 - 40 IU/L   ALT 73 (H) 0 - 44 IU/L  Hgb A1c w/o eAG     Status: Abnormal   Collection Time: 06/23/19 10:12 AM  Result Value Ref Range   Hgb A1c MFr Bld 10.6 (H) 4.8 - 5.6 %    Comment:          Prediabetes: 5.7 - 6.4          Diabetes: >6.4          Glycemic control for adults with diabetes: <7.0   Specimen status report     Status: None   Collection Time: 06/23/19 10:12 AM  Result Value Ref Range   specimen status report Comment     Comment: Isac Caddy CMP14 Default Ambig Abbrev CMP14 Default A hand-written panel/profile was received from your office. In accordance with the [facility] Ambiguous Test Code Policy dated July 1275, we have completed your order by using the closest currently or formerly recognized AMA panel.  We have assigned Comprehensive Metabolic Panel (14), Test Code #322000 to this request.  If this is not the testing you wished to receive on this specimen, please contact the [facility] Client Inquiry/Technical Services Department to clarify the test order.  We appreciate your business.     Assessment & Plan:   1. Uncontrolled type 2 diabetes mellitus with other specified complication, with [place]-term current use of insulin ([place])  - Patient has currently uncontrolled symptomatic type 2 DM since  53 years of age. - He reports significantly improving glycemic profile, more recently prebreakfast between 105-240, prelunch between 105-263, presupper between 140-243, bedtime between 86-170 .   His previsit labs show A1c of 10.6% slightly improving from 11.1%.    -Recent labs reviewed, showing normal renal function,.  -his diabetes is complicated by peripheral neuropathy, peripheral arterial disease,  sedentary life and Masaichi E Eisemann remains at a high risk for  more acute and chronic complications which include CAD, CVA, CKD, retinopathy,  and neuropathy. These are all discussed in detail with the patient.  - I have counseled him on diet management and weight loss, by adopting a carbohydrate restricted/protein rich diet.  - he  admits there is a room for improvement in his diet and drink choices. -  Suggestion is made for him to avoid simple carbohydrates  from his diet including Cakes, Sweet Desserts / Pastries, Ice Cream, Soda (diet and regular), Sweet Tea, Candies, Chips, Cookies, Sweet Pastries,  Store Bought Juices, Alcohol in Excess of  1-2 drinks a day, Artificial Sweeteners, Coffee Creamer, and "Sugar-free" Products. This will help patient to have stable blood glucose profile and potentially avoid unintended weight gain.   - I encouraged him to switch to  unprocessed or minimally processed complex starch and increased protein intake (animal or plant source), fruits, and vegetables.  - he is advised to stick to a routine mealtimes to eat 3 meals  a day and avoid unnecessary snacks ( to snack only to correct hypoglycemia).    - I have approached him with the following individualized plan to manage diabetes and patient agrees:  -Given his presentation with significant glycemic burden, he will continue to require higher dose of  insulin U500 in order for him to achieve and maintain control of diabetes. -He has extremely high degree of insulin resistance. -He is advised to continue Humulin U500 to 120 units 3 times a day with meals, for pre-meal blood glucose readings above 90 mg/dl.  -He is advised to continue documenting blood glucose at least 4 times a day using his libre CGM device.  -He is warned not to take insulin  without proper monitoring per orders. -Patient is encouraged to call clinic for blood glucose levels less than 70 or above 300 mg /dl. -He is advised to continue  metformin,  500 mg p.o. twice daily-after breakfast and supper.   - He has benefited from low-dose glipizide.  He is advised to continue glipizide  5 mg XL p.o. daily at breakfast.    - Patient specific target  A1c;  LDL, HDL, Triglycerides, and  Waist Circumference were discussed in detail.  2) BP/HTN: he is advised to home monitor blood pressure and report if > 140/90 on 2 separate readings.  He is advised to continue blood pressure medications including Hyzaar.   3) Lipids/HPL: His recent lipid panel showed significantly improved lipid panel triglycerides increasing to 616 from.  He is approached with the fact that better  control of diabetes will help control triglycerides.  He is advised to continue pravastatin 40 mg p.o. nightly, as well as fenofibrate 140 mg p.o. nightly.   4)  Weight/Diet: CDE Consult  in progress , exercise, and detailed carbohydrates information provided.  5) Chronic Care/Health Maintenance:  -he  is on ACEI/ARB and Statin medications and  is encouraged to continue to follow up with Ophthalmology (he is significantly overdue), Dentist,  Podiatrist at least yearly or according to recommendations, and advised to  stay away from smoking. I have recommended yearly flu vaccine and pneumonia vaccination at least every 5 years; moderate intensity exercise for up to 150 minutes weekly; and  sleep for at least 7 hours a day.  - I advised patient to maintain close follow up with Richarda BladeElliott, Dianne E for primary care needs.  - Patient Care Time Today:  25 min, of which >50% was spent in  counseling and the rest reviewing  his  current and  previous labs/studies, previous treatments, his blood glucose readings, and medications' doses and developing a plan for [place]-term care based on the latest recommendations for standards of care.   Russell Ratel Stickler participated in the discussions, expressed understanding, and voiced agreement with the above plans.  All questions were answered to his satisfaction. he is encouraged to contact clinic should he have any questions or concerns prior to his return visit.   Follow up plan: - Return in  about 3 months (around 11/01/2019) for Bring Meter and Logs- A1c in Office, Include 8 log sheets.  Marquis Lunch, MD Phone: [phone]  Fax: [phone]   08/01/2019, 11:41 AM   This note was partially dictated with voice recognition software. Similar sounding words can be transcribed inadequately or may not  be corrected upon review.

## 2019-08-16 ENCOUNTER — Other Ambulatory Visit: Payer: Self-pay

## 2019-08-16 ENCOUNTER — Encounter: Payer: Medicaid - Out of State | Attending: Nurse Practitioner | Admitting: Nutrition

## 2019-08-16 ENCOUNTER — Encounter: Payer: Self-pay | Admitting: Nutrition

## 2019-08-16 DIAGNOSIS — E669 Obesity, unspecified: Secondary | ICD-10-CM | POA: Diagnosis present

## 2019-08-16 DIAGNOSIS — E1165 Type 2 diabetes mellitus with hyperglycemia: Secondary | ICD-10-CM | POA: Diagnosis present

## 2019-08-16 DIAGNOSIS — E118 Type 2 diabetes mellitus with unspecified complications: Secondary | ICD-10-CM | POA: Insufficient documentation

## 2019-08-16 DIAGNOSIS — N183 Chronic kidney disease, stage 3 unspecified: Secondary | ICD-10-CM | POA: Insufficient documentation

## 2019-08-16 DIAGNOSIS — IMO0002 Reserved for concepts with insufficient information to code with codable children: Secondary | ICD-10-CM

## 2019-08-16 NOTE — Progress Notes (Signed)
Video visit Medical Nutrition Therapy:  Appt start time: 1450 end time 1530 Assessment:  Primary concerns today: Diabetes Type 2. He is divorces and lives with his daughter and grand kids. FBS 100-200's before meals.  BS has dropped in the middle of night.in 40's.  Changes made: working on portion sizes, increase fresh fruits and vegetables. Had both legs surgeries for veins. Still has neuropathy. Talked to Dr. Dorris Fetch,  Last week.  U500 120 units with each meal and Glipizide 5 mg per day.  Metformin 500 mg BID. Feels better. Still working on walking a little..  Lab Results  Component Value Date   HGBA1C 10.6 (H) 06/23/2019    Preferred Learning Style:    No preference indicated   Learning and ready for change  Contemplating  MEDICATIONS: see list   DIETARY INTAKE: .    24-hr recall:  B ( A 2 scrambled eggs and country ham, water Snk ( AM):   L ( PM):  Ham sandwich on wheat bread, water. Snk ( PM D)  [place] steak, green beans, mac/cheese, lima beans, water,  Beverages: water  Usual physical activity: ADL   Estimated energy needs: 1800  calories 200 g carbohydrates 135 g protein 50 g fat  Progress Towards Goal(s):  In progress.   Nutritional Diagnosis:  NB-1.1 Food and nutrition-related knowledge deficit As related to Diabetes Type 2.  As evidenced by A1C 11.1%.    Intervention:  Nutrition and Diabetes education provided on My Plate, CHO counting, meal planning, portion sizes, timing of meals, avoiding snacks between meals unless having a low blood sugar, target ranges for A1C and blood sugars, signs/symptoms and treatment of hyper/hypoglycemia, monitoring blood sugars, taking medications as prescribed, benefits of exercising 30 minutes per day and prevention of complications of DM.  Teaching Method Utilized: Telephone Verbal   Goals Eating meals on time -improved. Avoid skipping meal- improved Increase lower carb vegetables.- yes. Get A1C down to 8% in 3  months. Work in progress. Lose 10 lbs in the next 3 months. -keep working on weight loss. Avoid snacks. Drink only water. Get blood sugars before meals less than 150 mg/dl.   Handouts given during visit include:  Verbally went over My Plate   Barriers to learning/adherence to lifestyle change: none  Demonstrated degree of understanding via:  Teach Back   Monitoring/Evaluation:  Dietary intake, exercise, , and body weight in 3 month(s).

## 2019-08-28 ENCOUNTER — Other Ambulatory Visit: Payer: Self-pay | Admitting: "Endocrinology

## 2019-09-19 LAB — BASIC METABOLIC PANEL
BUN: 18 (ref 4–21)
Creatinine: 1.4 — AB (ref 0.6–1.3)

## 2019-09-19 LAB — LIPID PANEL
Cholesterol: 132 (ref 0–200)
HDL: 25 — AB (ref 35–70)
LDL Cholesterol: 53
Triglycerides: 271 — AB (ref 40–160)

## 2019-09-19 LAB — PSA: PSA: 0.73

## 2019-09-19 LAB — HEMOGLOBIN A1C: Hemoglobin A1C: 9.7

## 2019-10-02 ENCOUNTER — Other Ambulatory Visit: Payer: Self-pay | Admitting: "Endocrinology

## 2019-10-15 ENCOUNTER — Other Ambulatory Visit: Payer: Self-pay | Admitting: "Endocrinology

## 2019-11-03 ENCOUNTER — Ambulatory Visit: Payer: Medicaid - Out of State | Admitting: "Endocrinology

## 2019-11-03 ENCOUNTER — Ambulatory Visit: Payer: Medicaid - Out of State | Admitting: Nutrition

## 2019-11-04 ENCOUNTER — Other Ambulatory Visit: Payer: Self-pay | Admitting: "Endocrinology

## 2019-11-07 ENCOUNTER — Other Ambulatory Visit: Payer: Self-pay | Admitting: "Endocrinology

## 2019-11-17 ENCOUNTER — Encounter: Payer: Self-pay | Admitting: "Endocrinology

## 2019-11-17 ENCOUNTER — Encounter: Payer: Medicaid Other | Attending: "Endocrinology | Admitting: Nutrition

## 2019-11-17 ENCOUNTER — Other Ambulatory Visit: Payer: Self-pay

## 2019-11-17 ENCOUNTER — Ambulatory Visit (INDEPENDENT_AMBULATORY_CARE_PROVIDER_SITE_OTHER): Payer: Medicaid Other | Admitting: "Endocrinology

## 2019-11-17 VITALS — BP 139/87 | HR 103 | Ht 66.0 in | Wt 239.1 lb

## 2019-11-17 DIAGNOSIS — IMO0002 Reserved for concepts with insufficient information to code with codable children: Secondary | ICD-10-CM

## 2019-11-17 DIAGNOSIS — E1165 Type 2 diabetes mellitus with hyperglycemia: Secondary | ICD-10-CM | POA: Insufficient documentation

## 2019-11-17 DIAGNOSIS — E782 Mixed hyperlipidemia: Secondary | ICD-10-CM

## 2019-11-17 DIAGNOSIS — E669 Obesity, unspecified: Secondary | ICD-10-CM

## 2019-11-17 DIAGNOSIS — I10 Essential (primary) hypertension: Secondary | ICD-10-CM

## 2019-11-17 DIAGNOSIS — E118 Type 2 diabetes mellitus with unspecified complications: Secondary | ICD-10-CM | POA: Insufficient documentation

## 2019-11-17 DIAGNOSIS — N183 Chronic kidney disease, stage 3 unspecified: Secondary | ICD-10-CM | POA: Diagnosis present

## 2019-11-17 MED ORDER — GLIPIZIDE ER 5 MG PO TB24: 10.0000 mg | ORAL_TABLET | Freq: Every day | ORAL | 2 refills | Status: DC

## 2019-11-17 MED ORDER — HUMULIN R U-500 KWIKPEN 500 UNIT/ML ~~LOC~~ SOPN: 140.0000 [IU] | PEN_INJECTOR | Freq: Three times a day (TID) | SUBCUTANEOUS | 2 refills | Status: DC

## 2019-11-17 NOTE — Patient Instructions (Signed)

## 2019-11-17 NOTE — Progress Notes (Signed)
Medical Nutrition Therapy:  Appt start time: 1115 end time 1145 Assessment:  Primary concerns today: Diabetes Type 2. Follow up. A1c slightly from 10.6% to 9.7%. 9.8%. Eats dinner at his aunts house. Skips breakfast. Eating better foods at his [place].. Going to wound clinic now weekly.  Sores on both legs are improving. He is trying to make better food choices. Feels better. Current diet needs good quality protein and more fresh fruits and vegetables for wound healing. Would benefit from VIt C and Vit E supplements and MVI.  Metformin 500 mg BID, U500 140 units with meals. Glipizide 5 mg a day. Lab Results  Component Value Date   HGBA1C 9.7 09/19/2019    Lab Results  Component Value Date   HGBA1C 10.6 (H) 06/23/2019    Preferred Learning Style:    No preference indicated   Learning and ready for change  Contemplating  MEDICATIONS: see list   DIETARY INTAKE: .    24-hr recall:  B ( A 2 scrambled eggs and country ham, water Snk ( AM):   L ( PM):  Ham sandwich on wheat bread, water. Snk ( PM D)  [place] steak, green beans, mac/cheese, lima beans, water,  Beverages: water  Usual physical activity: ADL   Estimated energy needs: 1800  calories 200 g carbohydrates 135 g protein 50 g fat  Progress Towards Goal(s):  In progress.   Nutritional Diagnosis:  NB-1.1 Food and nutrition-related knowledge deficit As related to Diabetes Type 2.  As evidenced by A1C 11.1%.    Intervention:  Nutrition and Diabetes education provided on My Plate, CHO counting, meal planning, portion sizes, timing of meals, avoiding snacks between meals unless having a low blood sugar, target ranges for A1C and blood sugars, signs/symptoms and treatment of hyper/hypoglycemia, monitoring blood sugars, taking medications as prescribed, benefits of exercising 30 minutes per day and prevention of complications of DM.  Teaching Method Utilized:  Visual, written and verbal   Goals Eating meals  on time -improved. Avoid skipping meal- improved Increase lower carb vegetables.- yes. Get A1C down to 8% in 3 months. Work in progress. Lose 10 lbs in the next 3 months. -keep working on weight loss. Avoid snacks. Drink only water. Get blood sugars before meals less than 150 mg/dl.   Updated goals  Goals  Increase fresh fruits and lower carb veggies for wound healing. Ask MD about vit C and Vit E supplements  Consider a daily vitamin. Drink only water Avoid snacks between meals. Give insulin 30 minutes before meals. Goal is premeal BS less than 150 mg/dl. Get A1C down to 8%.    Handouts given during visit include:  Verbally went over My Plate   Barriers to learning/adherence to lifestyle change: none  Demonstrated degree of understanding via:  Teach Back   Monitoring/Evaluation:  Dietary intake, exercise, , and body weight in 3 month(s).

## 2019-11-17 NOTE — Progress Notes (Signed)
11/17/2019   Endocrinology follow-up note    Subjective:    Patient ID: Russell Clark, male    DOB: 19-Oct-1965.  he is being seen in follow-up for management of currently uncontrolled symptomatic type 2 diabetes, hyperlipidemia, hypertension. PCP:  Garald Braver   Past Medical History:  Diagnosis Date  . Diabetes mellitus, type II (HCC)   . Hyperlipidemia   . Hypertension    Past Surgical History:  Procedure Laterality Date  . OTHER SURGICAL HISTORY     L foot, Cataracts   Social History   Socioeconomic History  . Marital status: Married    Spouse name: Not on file  . Number of children: Not on file  . Years of education: Not on file  . Highest education level: Not on file  Occupational History  . Not on file  Tobacco Use  . Smoking status: Former Smoker    Packs/day: 1.00    Years: 25.00    Pack years: 25.00    Types: Cigarettes  . Smokeless tobacco: Never Used  Substance and Sexual Activity  . Alcohol use: No  . Drug use: No  . Sexual activity: Not on file  Other Topics Concern  . Not on file  Social History Narrative  . Not on file   Social Determinants of Health   Financial Resource Strain:   . Difficulty of Paying Living Expenses: Not on file  Food Insecurity:   . Worried About Programme researcher, broadcasting/film/video in the Last Year: Not on file  . Ran Out of Food in the Last Year: Not on file  Transportation Needs:   . Lack of Transportation (Medical): Not on file  . Lack of Transportation (Non-Medical): Not on file  Physical Activity:   . Days of Exercise per Week: Not on file  . Minutes of Exercise per Session: Not on file  Stress:   . Feeling of Stress : Not on file  Social Connections:   . Frequency of Communication with Friends and Family: Not on file  . Frequency of Social Gatherings with Friends and Family: Not on file  . Attends Religious Services: Not on file  . Active Member of Clubs or Organizations: Not on file  . Attends Tax inspector Meetings: Not on file  . Marital Status: Not on file   Outpatient Encounter Medications as of 11/17/2019  Medication Sig  . buPROPion (WELLBUTRIN SR) 150 MG 12 hr tablet Take 150 mg by mouth 2 (two) times daily.  . Cholecalciferol (VITAMIN D PO) Take by mouth.  . clopidogrel (PLAVIX) 75 MG tablet Take 75 mg by mouth daily.  . Continuous Blood Gluc Sensor (FREESTYLE LIBRE 14 DAY SENSOR) MISC 1 each by Other route every 14 (fourteen) days.  . Continuous Blood Gluc Sensor (FREESTYLE LIBRE 14 DAY SENSOR) MISC USE AS DIRECTED EVERY  14  DAYS  . Continuous Blood Gluc Sensor (FREESTYLE LIBRE 14 DAY SENSOR) MISC USE AS DIRECTED EVERY  14  DAYS  . fenofibrate (TRICOR) 145 MG tablet Take 1 tablet by mouth once daily  . glipiZIDE (GLUCOTROL XL) 5 MG 24 hr tablet Take 2 tablets (10 mg total) by mouth daily with breakfast.  . insulin regular human CONCENTRATED (HUMULIN R U-500 KWIKPEN) 500 UNIT/ML kwikpen Inject 140 Units into the skin 3 (three) times daily with meals.  Marland Kitchen loperamide (IMODIUM A-D) 2 MG tablet Take 12 mg by mouth daily.  Marland Kitchen losartan-hydrochlorothiazide (HYZAAR) 100-12.5 MG tablet Take 1 tablet by mouth daily.  Marland Kitchen  metFORMIN (GLUCOPHAGE) 500 MG tablet TAKE 1 TABLET BY MOUTH TWICE DAILY WITH MEALS  . nortriptyline (PAMELOR) 25 MG capsule Take 25 mg by mouth at bedtime.  Marland Kitchen omeprazole (PRILOSEC) 40 MG capsule Take 40 mg by mouth daily.  . pravastatin (PRAVACHOL) 40 MG tablet Take 1 tablet (40 mg total) by mouth daily.  . pregabalin (LYRICA) 300 MG capsule Take 300 mg by mouth 3 (three) times daily.   Marland Kitchen pyridOXINE (VITAMIN B-6) 100 MG tablet Take 100 mg by mouth daily.  . [DISCONTINUED] glipiZIDE (GLUCOTROL XL) 5 MG 24 hr tablet Take 1 tablet by mouth once daily with breakfast  . [DISCONTINUED] HUMULIN R U-500 KWIKPEN 500 UNIT/ML kwikpen INJECT 120 UNITS WITH BREAKFAST, 120 UNITS WITH LUNCH, AND 120 UNITS WITH SUPPER WHEN PRE-MEAL BLOOD GLUCOSE READINGS ARE ABOVE 90MG /DL   No  facility-administered encounter medications on file as of 11/17/2019.    ALLERGIES: Allergies  Allergen Reactions  . Aspirin   . Darvon [Propoxyphene]   . Ibuprofen   . Penicillins     VACCINATION STATUS:  There is no immunization history on file for this patient.  Diabetes He presents for his follow-up diabetic visit. He has type 2 diabetes mellitus. Onset time: He was diagnosed at approximate age of 41 years. His disease course has been worsening. There are no hypoglycemic associated symptoms. Pertinent negatives for hypoglycemia include no confusion, headaches, pallor or seizures. Pertinent negatives for diabetes include no blurred vision, no chest pain, no fatigue, no polydipsia, no polyphagia, no polyuria and no weakness. There are no hypoglycemic complications. Symptoms are worsening. Diabetic complications include peripheral neuropathy. Risk factors for coronary artery disease include dyslipidemia, diabetes mellitus, hypertension, male sex, sedentary lifestyle and tobacco exposure. Current diabetic treatment includes insulin injections and oral agent (dual therapy). He is compliant with treatment most of the time. His weight is fluctuating minimally. He is following a generally unhealthy diet. When asked about meal planning, he reported none. He never participates in exercise. His home blood glucose trend is increasing steadily. His breakfast blood glucose range is generally 130-140 mg/dl. His lunch blood glucose range is generally 140-180 mg/dl. His dinner blood glucose range is generally 140-180 mg/dl. His bedtime blood glucose range is generally 140-180 mg/dl. His overall blood glucose range is 140-180 mg/dl. An ACE inhibitor/angiotensin II receptor blocker is being taken. He sees a podiatrist.Eye exam is current (He is due for his dilated eye exam, promises to do before next visit.).  Hyperlipidemia This is a chronic problem. The current episode started more than 1 year ago. The problem  is uncontrolled. Recent lipid tests were reviewed and are high. Exacerbating diseases include diabetes and obesity. Factors aggravating his hyperlipidemia include smoking. Pertinent negatives include no chest pain, myalgias or shortness of breath. Current antihyperlipidemic treatment includes statins. Risk factors for coronary artery disease include dyslipidemia, diabetes mellitus, a sedentary lifestyle and obesity.  Hypertension This is a chronic problem. The current episode started more than 1 year ago. The problem is controlled. Pertinent negatives include no blurred vision, chest pain, headaches, neck pain, palpitations or shortness of breath. Risk factors for coronary artery disease include dyslipidemia, diabetes mellitus, male gender, sedentary lifestyle and smoking/tobacco exposure. Past treatments include angiotensin blockers.   Review of systems: Limited as above.   Objective:    BP 139/87   Pulse (!) 103   Ht 5\' 6"  (1.676 m)   Wt 239 lb 1.6 oz (108.5 kg)   BMI 38.59 kg/m   Wt Readings from Last  3 Encounters:  11/17/19 239 lb 1.6 oz (108.5 kg)  07/05/19 230 lb (104.3 kg)  03/29/19 235 lb (106.6 kg)    Recent Results (from the past 2160 hour(s))  Basic metabolic panel     Status: Abnormal   Collection Time: 09/19/19 12:00 AM  Result Value Ref Range   BUN 18 4 - 21   Creatinine 1.4 (A) 0.6 - 1.3  Lipid panel     Status: Abnormal   Collection Time: 09/19/19 12:00 AM  Result Value Ref Range   Triglycerides 271 (A) 40 - 160   Cholesterol 132 0 - 200   HDL 25 (A) 35 - 70   LDL Cholesterol 53   Hemoglobin A1c     Status: None   Collection Time: 09/19/19 12:00 AM  Result Value Ref Range   Hemoglobin A1C 9.7   PSA     Status: None   Collection Time: 09/19/19 12:00 AM  Result Value Ref Range   PSA 0.73      Assessment & Plan:   1. Uncontrolled type 2 diabetes mellitus with other specified complication, with [place]-term current use of insulin (HCC)  - Patient has currently  uncontrolled symptomatic type 2 DM since  54 years of age. - He presents with his CGM device showing persistently above target glycemic profile.   His average blood glucose is 323, time range only in 10% of the times, 90% above range.  He does not have any hypoglycemia. His previsit labs show A1c of 9.7%, only modestly improving from 11.1%.   -Recent labs reviewed, showing normal renal function,.  -his diabetes is complicated by peripheral neuropathy, peripheral arterial disease,  sedentary life and Marquice E Brouillet remains at a high risk for more acute and chronic complications which include CAD, CVA, CKD, retinopathy, and neuropathy. These are all discussed in detail with the patient.  - I have counseled him on diet management and weight loss, by adopting a carbohydrate restricted/protein rich diet.  - he  admits there is a room for improvement in his diet and drink choices. -  Suggestion is made for him to avoid simple carbohydrates  from his diet including Cakes, Sweet Desserts / Pastries, Ice Cream, Soda (diet and regular), Sweet Tea, Candies, Chips, Cookies, Sweet Pastries,  Store Bought Juices, Alcohol in Excess of  1-2 drinks a day, Artificial Sweeteners, Coffee Creamer, and "Sugar-free" Products. This will help patient to have stable blood glucose profile and potentially avoid unintended weight gain.   - I encouraged him to switch to  unprocessed or minimally processed complex starch and increased protein intake (animal or plant source), fruits, and vegetables.  - he is advised to stick to a routine mealtimes to eat 3 meals  a day and avoid unnecessary snacks ( to snack only to correct hypoglycemia).    - I have approached him with the following individualized plan to manage diabetes and patient agrees:  -Given his presentation with significant glycemic burden, he will continue to require higher dose of  insulin U500 in order for him to achieve and maintain control of diabetes. -He has  extremely high degree of insulin resistance. -He is advised to increase Humulin U500- to 140  units 3 times a day with meals, for pre-meal blood glucose readings above 90 mg/dl.  -He is advised to continue documenting blood glucose at least 4 times a day using his libre CGM device. -Insulin injection technique is redemonstrated for him.  -He is warned not to take insulin  without proper monitoring per orders. -Patient is encouraged to call clinic for blood glucose levels less than 70 or above 300 mg /dl. -He is advised to continue  metformin,  500 mg p.o. twice daily-after breakfast and supper.   - He has benefited from low-dose glipizide.  He is advised to increase glipizide to 10 mg p.o. daily at breakfast.      - Patient specific target  A1c;  LDL, HDL, Triglycerides, and  Waist Circumference were discussed in detail.  2) BP/HTN:  His blood pressure is controlled to target.  He is advised to continue blood pressure medications including Hyzaar.   3) Lipids/HPL: His recent lipid panel showed significantly improved lipid panel triglycerides down to 271.   -  He is approached with the fact that better  control of diabetes will help control triglycerides.  He is advised to continue pravastatin 40 mg p.o. nightly as well as fenofibrate 140 mg p.o. nightly.    4)  Weight/Diet: CDE Consult  in progress , exercise, and detailed carbohydrates information provided.  5) Chronic Care/Health Maintenance:  -he  is on ACEI/ARB and Statin medications and  is encouraged to continue to follow up with Ophthalmology (he is significantly overdue), Dentist,  Podiatrist at least yearly or according to recommendations, and advised to  stay away from smoking. I have recommended yearly flu vaccine and pneumonia vaccination at least every 5 years; moderate intensity exercise for up to 150 minutes weekly; and  sleep for at least 7 hours a day.  - I advised patient to maintain close follow up with Ephri[facility] Jenkins  E for primary care needs.  - Time spent on this patient care encounter:  35 min, of which > 50% was spent in  counseling and the rest reviewing his blood glucose logs , discussing his hypoglycemia and hyperglycemia episodes, reviewing his current and  previous labs / studies  ( including abstraction from other facilities) and medications  doses and developing a  [place] term treatment plan and documenting his care.   Please refer to Patient Instructions for Blood Glucose Monitoring and Insulin/Medications Dosing Guide"  in media tab for additional information. Please  also refer to " Patient Self Inventory" in the Media  tab for reviewed elements of pertinent patient history.  Carolin Sicks Schreier participated in the discussions, expressed understanding, and voiced agreement with the above plans.  All questions were answered to his satisfaction. he is encouraged to contact clinic should he have any questions or concerns prior to his return visit.   Follow up plan: - Return in about 3 months (around 02/17/2020) for Bring Meter and Logs- A1c in Office.  Glade Lloyd, MD Phone: [phone]  Fax: [phone]   11/17/2019, 8:49 PM   This note was partially dictated with voice recognition software. Similar sounding words can be transcribed inadequately or may not  be corrected upon review.

## 2019-11-22 ENCOUNTER — Encounter: Payer: Self-pay | Admitting: Nutrition

## 2019-11-22 NOTE — Patient Instructions (Signed)
Goals  Increase fresh fruits and lower carb veggies for wound healing. Ask MD about vit C and Vit E supplements  Consider a daily vitamin. Drink only water Avoid snacks between meals. Give insulin 30 minutes before meals. Goal is premeal BS less than 150 mg/dl. Get A1C down to 8%.

## 2019-12-31 ENCOUNTER — Other Ambulatory Visit: Payer: Self-pay | Admitting: "Endocrinology

## 2020-01-08 ENCOUNTER — Other Ambulatory Visit: Payer: Self-pay | Admitting: "Endocrinology

## 2020-01-28 ENCOUNTER — Other Ambulatory Visit: Payer: Self-pay | Admitting: "Endocrinology

## 2020-02-21 ENCOUNTER — Ambulatory Visit: Payer: Medicaid Other | Admitting: "Endocrinology

## 2020-02-27 ENCOUNTER — Other Ambulatory Visit: Payer: Self-pay

## 2020-02-27 ENCOUNTER — Encounter: Payer: Self-pay | Admitting: "Endocrinology

## 2020-02-27 ENCOUNTER — Ambulatory Visit (INDEPENDENT_AMBULATORY_CARE_PROVIDER_SITE_OTHER): Payer: Medicare Other | Admitting: "Endocrinology

## 2020-02-27 ENCOUNTER — Ambulatory Visit: Payer: Medicaid Other | Admitting: Nutrition

## 2020-02-27 VITALS — BP 137/85 | HR 103 | Ht 66.0 in | Wt 243.2 lb

## 2020-02-27 DIAGNOSIS — E1165 Type 2 diabetes mellitus with hyperglycemia: Secondary | ICD-10-CM

## 2020-02-27 DIAGNOSIS — E782 Mixed hyperlipidemia: Secondary | ICD-10-CM | POA: Diagnosis not present

## 2020-02-27 DIAGNOSIS — I10 Essential (primary) hypertension: Secondary | ICD-10-CM | POA: Diagnosis not present

## 2020-02-27 LAB — POCT GLYCOSYLATED HEMOGLOBIN (HGB A1C): Hemoglobin A1C: 10.4 % — AB (ref 4.0–5.6)

## 2020-02-27 MED ORDER — BD PEN NEEDLE SHORT U/F 31G X 8 MM MISC: 1.0000 | 3 refills | Status: DC

## 2020-02-27 MED ORDER — HUMULIN R U-500 KWIKPEN 500 UNIT/ML ~~LOC~~ SOPN: 150.0000 [IU] | PEN_INJECTOR | Freq: Three times a day (TID) | SUBCUTANEOUS | 2 refills | Status: DC

## 2020-02-27 NOTE — Patient Instructions (Signed)

## 2020-02-27 NOTE — Progress Notes (Signed)
02/27/2020   Endocrinology follow-up note    Subjective:    Patient ID: Russell Clark, male    DOB: Mar 11, 1966.  he is being seen in follow-up for management of currently uncontrolled symptomatic type 2 diabetes, hyperlipidemia, hypertension. PCP:  Garald Braver   Past Medical History:  Diagnosis Date  . Diabetes mellitus, type II (HCC)   . Hyperlipidemia   . Hypertension    Past Surgical History:  Procedure Laterality Date  . OTHER SURGICAL HISTORY     L foot, Cataracts   Social History   Socioeconomic History  . Marital status: Married    Spouse name: Not on file  . Number of children: Not on file  . Years of education: Not on file  . Highest education level: Not on file  Occupational History  . Not on file  Tobacco Use  . Smoking status: Former Smoker    Packs/day: 1.00    Years: 25.00    Pack years: 25.00    Types: Cigarettes  . Smokeless tobacco: Never Used  Vaping Use  . Vaping Use: Never used  Substance and Sexual Activity  . Alcohol use: No  . Drug use: No  . Sexual activity: Not on file  Other Topics Concern  . Not on file  Social History Narrative  . Not on file   Social Determinants of Health   Financial Resource Strain:   . Difficulty of Paying Living Expenses:   Food Insecurity:   . Worried About Programme researcher, broadcasting/film/video in the Last Year:   . Barista in the Last Year:   Transportation Needs:   . Freight forwarder (Medical):   Marland Kitchen Lack of Transportation (Non-Medical):   Physical Activity:   . Days of Exercise per Week:   . Minutes of Exercise per Session:   Stress:   . Feeling of Stress :   Social Connections:   . Frequency of Communication with Friends and Family:   . Frequency of Social Gatherings with Friends and Family:   . Attends Religious Services:   . Active Member of Clubs or Organizations:   . Attends Banker Meetings:   Marland Kitchen Marital Status:    Outpatient Encounter Medications as of 02/27/2020   Medication Sig  . buPROPion (WELLBUTRIN SR) 150 MG 12 hr tablet Take 150 mg by mouth 2 (two) times daily.  . Cholecalciferol (VITAMIN D PO) Take by mouth.  . clopidogrel (PLAVIX) 75 MG tablet Take 75 mg by mouth daily.  . Continuous Blood Gluc Sensor (FREESTYLE LIBRE 14 DAY SENSOR) MISC 1 each by Other route every 14 (fourteen) days.  . Continuous Blood Gluc Sensor (FREESTYLE LIBRE 14 DAY SENSOR) MISC USE AS DIRECTED EVERY 14 DAYS  . glipiZIDE (GLUCOTROL XL) 5 MG 24 hr tablet TAKE 2 TABLETS BY MOUTH ONCE DAILY WITH BREAKFAST  . Insulin Pen Needle (B-D ULTRAFINE III SHORT PEN) 31G X 8 MM MISC 1 each by Does not apply route as directed.  . insulin regular human CONCENTRATED (HUMULIN R U-500 KWIKPEN) 500 UNIT/ML kwikpen Inject 150 Units into the skin 3 (three) times daily with meals.  Marland Kitchen loperamide (IMODIUM A-D) 2 MG tablet Take 12 mg by mouth daily.  Marland Kitchen losartan-hydrochlorothiazide (HYZAAR) 100-12.5 MG tablet Take 1 tablet by mouth daily.  . metFORMIN (GLUCOPHAGE) 500 MG tablet TAKE 1 TABLET BY MOUTH TWICE DAILY WITH MEALS  . nortriptyline (PAMELOR) 25 MG capsule Take 25 mg by mouth at bedtime.  Marland Kitchen omeprazole (  PRILOSEC) 40 MG capsule Take 40 mg by mouth daily.  . pravastatin (PRAVACHOL) 40 MG tablet Take 1 tablet (40 mg total) by mouth daily.  . pregabalin (LYRICA) 300 MG capsule Take 300 mg by mouth 3 (three) times daily.   Marland Kitchen pyridOXINE (VITAMIN B-6) 100 MG tablet Take 100 mg by mouth daily.  . [DISCONTINUED] fenofibrate (TRICOR) 145 MG tablet Take 1 tablet by mouth once daily  . [DISCONTINUED] insulin regular human CONCENTRATED (HUMULIN R U-500 KWIKPEN) 500 UNIT/ML kwikpen Inject 140 Units into the skin 3 (three) times daily with meals.   No facility-administered encounter medications on file as of 02/27/2020.    ALLERGIES: Allergies  Allergen Reactions  . Aspirin   . Darvon [Propoxyphene]   . Ibuprofen   . Penicillins     VACCINATION STATUS:  There is no immunization history on file  for this patient.  Diabetes He presents for his follow-up diabetic visit. He has type 2 diabetes mellitus. Onset time: He was diagnosed at approximate age of 28 years. His disease course has been worsening. There are no hypoglycemic associated symptoms. Pertinent negatives for hypoglycemia include no confusion, headaches, pallor or seizures. Pertinent negatives for diabetes include no blurred vision, no chest pain, no fatigue, no polydipsia, no polyphagia, no polyuria and no weakness. There are no hypoglycemic complications. Symptoms are worsening. Diabetic complications include peripheral neuropathy. Risk factors for coronary artery disease include dyslipidemia, diabetes mellitus, hypertension, male sex, sedentary lifestyle and tobacco exposure. Current diabetic treatment includes insulin injections and oral agent (dual therapy). He is compliant with treatment most of the time. His weight is fluctuating minimally. He is following a generally unhealthy diet. When asked about meal planning, he reported none. He never participates in exercise. His home blood glucose trend is increasing steadily. His breakfast blood glucose range is generally >200 mg/dl. His lunch blood glucose range is generally >200 mg/dl. His dinner blood glucose range is generally >200 mg/dl. His bedtime blood glucose range is generally >200 mg/dl. His overall blood glucose range is >200 mg/dl. (Analysis of his CGM report reveals 94% above range, 6% time in range.  He has no hypoglycemia.  He skips at least 30% of insulin injection opportunities.  His point-of-care A1c is 10.4%, increasing from 9.7%.) An ACE inhibitor/angiotensin II receptor blocker is being taken. He sees a podiatrist.Eye exam is current (He is due for his dilated eye exam, promises to do before next visit.).  Hyperlipidemia This is a chronic problem. The current episode started more than 1 year ago. The problem is uncontrolled. Recent lipid tests were reviewed and are high.  Exacerbating diseases include diabetes and obesity. Factors aggravating his hyperlipidemia include smoking. Pertinent negatives include no chest pain, myalgias or shortness of breath. Current antihyperlipidemic treatment includes statins. Risk factors for coronary artery disease include dyslipidemia, diabetes mellitus, a sedentary lifestyle and obesity.  Hypertension This is a chronic problem. The current episode started more than 1 year ago. The problem is controlled. Pertinent negatives include no blurred vision, chest pain, headaches, neck pain, palpitations or shortness of breath. Risk factors for coronary artery disease include dyslipidemia, diabetes mellitus, male gender, sedentary lifestyle and smoking/tobacco exposure. Past treatments include angiotensin blockers.    Review of systems  Constitutional: + Progressive weight gain,  current  Body mass index is 39.25 kg/m. , no fatigue, no subjective hyperthermia, no subjective hypothermia Eyes: no blurry vision, no xerophthalmia ENT: no sore throat, no nodules palpated in throat, no dysphagia/odynophagia, no hoarseness Cardiovascular: no Chest Pain,  no Shortness of Breath, no palpitations, no leg swelling Respiratory: no cough, no shortness of breath Gastrointestinal: no Nausea/Vomiting/Diarhhea Musculoskeletal: no muscle/joint aches Skin: no rashes, no hyperemia Neurological: no tremors, no numbness, no tingling, no dizziness Psychiatric: no depression, no anxiety    Objective:    BP 137/85   Pulse (!) 103   Ht 5\' 6"  (1.676 m)   Wt 243 lb 3.2 oz (110.3 kg)   BMI 39.25 kg/m   Wt Readings from Last 3 Encounters:  02/27/20 243 lb 3.2 oz (110.3 kg)  11/17/19 239 lb 1.6 oz (108.5 kg)  07/05/19 230 lb (104.3 kg)     Physical Exam- Limited  Constitutional:  Body mass index is 39.25 kg/m. , not in acute distress, normal state of mind Eyes:  EOMI, no exophthalmos Neck: Supple Thyroid: No gross goiter Respiratory: Adequate  breathing efforts Musculoskeletal:  + Bilateral lower extremities in a wrap due to venostasis, peripheral arterial disease.   no gross restriction of joint movements Skin:  no rashes, no hyperemia Neurological: no tremor with outstretched hands,     Recent Results (from the past 2160 hour(s))  HgB A1c     Status: Abnormal   Collection Time: 02/27/20 11:01 AM  Result Value Ref Range   Hemoglobin A1C 10.4 (A) 4.0 - 5.6 %   HbA1c POC (<> result, manual entry)     HbA1c, POC (prediabetic range)     HbA1c, POC (controlled diabetic range)     CMP Latest Ref Rng & Units 09/19/2019 06/23/2019 02/21/2019  Glucose 65 - 99 mg/dL - 04/23/2019) 161(W)  BUN 4 - 21 18 16 18   Creatinine 0.6 - 1.3 1.4(A) 1.27 1.42(H)  Sodium 134 - 144 mmol/L - 135 143  Potassium 3.5 - 5.2 mmol/L - 4.6 4.2  Chloride 96 - 106 mmol/L - 100 105  CO2 20 - 29 mmol/L - 21 26  Calcium 8.7 - 10.2 mg/dL - 8.9 9.2  Total Protein 6.0 - 8.5 g/dL - 5.8(L) 5.8(L)  Total Bilirubin 0.0 - 1.2 mg/dL - 0.5 0.3  Alkaline Phos 39 - 117 IU/L - 225(H) 91  AST 0 - 40 IU/L - 44(H) 27  ALT 0 - 44 IU/L - 73(H) 37   Lipid Panel     Component Value Date/Time   CHOL 132 09/19/2019 0000   TRIG 271 (A) 09/19/2019 0000   HDL 25 (A) 09/19/2019 0000   LDLCALC 53 09/19/2019 0000     Assessment & Plan:   1. Uncontrolled type 2 diabetes mellitus with other specified complication, with [place]-term current use of insulin (HCC)  - Patient has currently uncontrolled symptomatic type 2 DM since  54 years of age. - He presents with his CGM device showing persistently above target glycemic profile.   Analysis of his CGM report reveals 94% above range, 6% time in range.  He has no hypoglycemia.  He skips at least 30% of insulin injection opportunities.  His point-of-care A1c is 10.4%, increasing from 9.7%.   -Recent labs reviewed, showing normal renal function,.  -his diabetes is complicated by peripheral neuropathy, peripheral arterial disease,  sedentary  life and Russell Clark at a high risk for more acute and chronic complications which include CAD, CVA, CKD, retinopathy, and neuropathy. These are all discussed in detail with the patient.  - I have counseled him on diet management and weight loss, by adopting a carbohydrate restricted/protein rich diet.  - he  admits there is a room for improvement in his  diet and drink choices. -  Suggestion is made for him to avoid simple carbohydrates  from his diet including Cakes, Sweet Desserts / Pastries, Ice Cream, Soda (diet and regular), Sweet Tea, Candies, Chips, Cookies, Sweet Pastries,  Store Bought Juices, Alcohol in Excess of  1-2 drinks a day, Artificial Sweeteners, Coffee Creamer, and "Sugar-free" Products. This will help patient to have stable blood glucose profile and potentially avoid unintended weight gain.   - I encouraged him to switch to  unprocessed or minimally processed complex starch and increased protein intake (animal or plant source), fruits, and vegetables.  - he is advised to stick to a routine mealtimes to eat 3 meals  a day and avoid unnecessary snacks ( to snack only to correct hypoglycemia).    - I have approached him with the following individualized plan to manage diabetes and patient agrees:  -Given his presentation with significant glycemic burden, he will continue to require higher dose of  insulin U500 in order for him to achieve and maintain control of diabetes. -He has extremely high degree of insulin resistance. -He is advised to increase his Humulin U500 to 150 units 3 times a day with meals, for pre-meal blood glucose readings above 90 mg/dl.  -He is advised to continue documenting blood glucose at least 4 times a day using his libre CGM device. -Insulin injection technique is redemonstrated for him.  -He is warned not to take insulin  without proper monitoring per orders. -Patient is encouraged to call clinic for blood glucose levels less than 70 or above  300 mg /dl. -He is advised to continue  metformin,  500 mg p.o. twice daily-after breakfast and supper.   - He is advised to continue glipizide 10 mg XL daily at breakfast.   -He would benefit the most from weight loss-see below.    - Patient specific target  A1c;  LDL, HDL, Triglycerides were discussed in detail.  2) BP/HTN:  -His blood pressure is controlled to target.  He is advised to continue blood pressure medications including Hyzaar.   3) Lipids/HPL: His recent lipid panel showed significantly improved lipid panel triglycerides down to 271.   -  He is approached with the fact that better  control of diabetes will help control triglycerides.  He is advised to continue pravastatin 40 mg p.o. nightly  as well as fenofibrate 140 mg p.o. nightly.    4)  Weight/Diet: His BMI 39.2-clearly complicating his diabetes care.  He would benefit the most from weight loss which he cannot achieve easily.  I discussed and recommended bariatric surgery for him.  He is given information in contact numbers to initiate the process.  In the meantime, CDE Consult  in progress , exercise, and detailed carbohydrates information provided.  5) Chronic Care/Health Maintenance:  -he  is on ACEI/ARB and Statin medications and  is encouraged to continue to follow up with Ophthalmology (he is significantly overdue), Dentist,  Podiatrist at least yearly or according to recommendations, and advised to  stay away from smoking. I have recommended yearly flu vaccine and pneumonia vaccination at least every 5 years; moderate intensity exercise for up to 150 minutes weekly; and  sleep for at least 7 hours a day.  - I advised patient to maintain close follow up with Russell Clark for primary care needs.  - Time spent on this patient care encounter:  45 min, of which > 50% was spent in  counseling and the rest reviewing his blood glucose  logs , discussing his hypoglycemia and hyperglycemia episodes, reviewing his  current and  previous labs / studies  ( including abstraction from other facilities) and medications  doses and developing a  [place] term treatment plan and documenting his care.   Please refer to Patient Instructions for Blood Glucose Monitoring and Insulin/Medications Dosing Guide"  in media tab for additional information. Please  also refer to " Patient Self Inventory" in the Media  tab for reviewed elements of pertinent patient history.  Russell Clark participated in the discussions, expressed understanding, and voiced agreement with the above plans.  All questions were answered to his satisfaction. he is encouraged to contact clinic should he have any questions or concerns prior to his return visit.   Follow up plan: - Return in about 3 months (around 05/29/2020) for F/U with Pre-visit Labs, Meter, Logs, A1c here., [place] [facility]rine MA.  Marquis LunchGebre Russell Alkhatib, MD Phone: [phone]  Fax: [phone]   02/27/2020, 12:41 PM   This note was partially dictated with voice recognition software. Similar sounding words can be transcribed inadequately or may not  be corrected upon review.

## 2020-03-09 ENCOUNTER — Other Ambulatory Visit: Payer: Self-pay | Admitting: "Endocrinology

## 2020-03-18 ENCOUNTER — Other Ambulatory Visit: Payer: Self-pay | Admitting: "Endocrinology

## 2020-03-21 ENCOUNTER — Telehealth: Payer: Self-pay | Admitting: "Endocrinology

## 2020-03-21 NOTE — Telephone Encounter (Signed)
Patient checking to see what he is suppose to do about the weight loss surgery place that you referred him to. He can not get anyone on the phone. He said he has called several times. He has lost the paper now. Can you please advise?

## 2020-03-26 NOTE — Telephone Encounter (Signed)
These are the numbers : [phone], [phone], or [phone]. He can try again, leave message if no answer.

## 2020-05-18 LAB — BASIC METABOLIC PANEL
BUN: 24 — AB (ref 4–21)
Creatinine: 1.3 (ref 0.6–1.3)
Potassium: 4.6 (ref 3.4–5.3)

## 2020-05-18 LAB — COMPREHENSIVE METABOLIC PANEL
Calcium: 8.6 — AB (ref 8.7–10.7)
GFR calc Af Amer: 73
GFR calc non Af Amer: 63

## 2020-05-19 LAB — TSH: TSH: 0.93 (ref 0.41–5.90)

## 2020-05-29 ENCOUNTER — Ambulatory Visit: Payer: Medicare Other | Admitting: "Endocrinology

## 2020-06-07 ENCOUNTER — Other Ambulatory Visit (HOSPITAL_COMMUNITY): Payer: Self-pay | Admitting: General Surgery

## 2020-06-07 ENCOUNTER — Other Ambulatory Visit: Payer: Self-pay | Admitting: General Surgery

## 2020-06-07 DIAGNOSIS — E669 Obesity, unspecified: Secondary | ICD-10-CM

## 2020-06-14 ENCOUNTER — Encounter: Payer: Self-pay | Admitting: "Endocrinology

## 2020-06-14 ENCOUNTER — Other Ambulatory Visit: Payer: Self-pay

## 2020-06-14 ENCOUNTER — Ambulatory Visit (INDEPENDENT_AMBULATORY_CARE_PROVIDER_SITE_OTHER): Payer: Medicare Other | Admitting: "Endocrinology

## 2020-06-14 VITALS — BP 140/85 | HR 94 | Ht 66.0 in | Wt 244.0 lb

## 2020-06-14 DIAGNOSIS — I10 Essential (primary) hypertension: Secondary | ICD-10-CM

## 2020-06-14 DIAGNOSIS — E782 Mixed hyperlipidemia: Secondary | ICD-10-CM | POA: Diagnosis not present

## 2020-06-14 DIAGNOSIS — E1165 Type 2 diabetes mellitus with hyperglycemia: Secondary | ICD-10-CM

## 2020-06-14 LAB — POCT GLYCOSYLATED HEMOGLOBIN (HGB A1C): Hemoglobin A1C: 9.7 % — AB (ref 4.0–5.6)

## 2020-06-14 MED ORDER — DEXCOM G6 SENSOR MISC
4.0000 | 2 refills | Status: AC
Start: 2020-06-14 — End: ?

## 2020-06-14 MED ORDER — DEXCOM G6 TRANSMITTER MISC: 1.0000 | 1 refills | Status: AC

## 2020-06-14 MED ORDER — HUMULIN R U-500 KWIKPEN 500 UNIT/ML ~~LOC~~ SOPN: 160.0000 [IU] | PEN_INJECTOR | Freq: Three times a day (TID) | SUBCUTANEOUS | 2 refills | Status: DC

## 2020-06-14 MED ORDER — DEXCOM G6 RECEIVER DEVI
1.0000 | 0 refills | Status: AC | PRN
Start: 2020-06-14 — End: ?

## 2020-06-14 NOTE — Progress Notes (Signed)
06/14/2020   Endocrinology follow-up note    Subjective:    Patient ID: Russell Clark, male    DOB: October 15, 1965.  he is being seen in follow-up for management of currently uncontrolled symptomatic type 2 diabetes, hyperlipidemia, hypertension. PCP:  Garald Braver   Past Medical History:  Diagnosis Date  . Diabetes mellitus, type II (HCC)   . Hyperlipidemia   . Hypertension    Past Surgical History:  Procedure Laterality Date  . OTHER SURGICAL HISTORY     L foot, Cataracts   Social History   Socioeconomic History  . Marital status: Divorced    Spouse name: Not on file  . Number of children: Not on file  . Years of education: Not on file  . Highest education level: Not on file  Occupational History  . Not on file  Tobacco Use  . Smoking status: Former Smoker    Packs/day: 1.00    Years: 25.00    Pack years: 25.00    Types: Cigarettes  . Smokeless tobacco: Never Used  Vaping Use  . Vaping Use: Never used  Substance and Sexual Activity  . Alcohol use: No  . Drug use: No  . Sexual activity: Not on file  Other Topics Concern  . Not on file  Social History Narrative  . Not on file   Social Determinants of Health   Financial Resource Strain:   . Difficulty of Paying Living Expenses: Not on file  Food Insecurity:   . Worried About Programme researcher, broadcasting/film/video in the Last Year: Not on file  . Ran Out of Food in the Last Year: Not on file  Transportation Needs:   . Lack of Transportation (Medical): Not on file  . Lack of Transportation (Non-Medical): Not on file  Physical Activity:   . Days of Exercise per Week: Not on file  . Minutes of Exercise per Session: Not on file  Stress:   . Feeling of Stress : Not on file  Social Connections:   . Frequency of Communication with Friends and Family: Not on file  . Frequency of Social Gatherings with Friends and Family: Not on file  . Attends Religious Services: Not on file  . Active Member of Clubs or Organizations:  Not on file  . Attends Banker Meetings: Not on file  . Marital Status: Not on file   Outpatient Encounter Medications as of 06/14/2020  Medication Sig  . buPROPion (WELLBUTRIN SR) 150 MG 12 hr tablet Take 150 mg by mouth 2 (two) times daily.  . Cholecalciferol (VITAMIN D PO) Take by mouth.  . cilostazol (PLETAL) 50 MG tablet 2 (two) times daily.  . clopidogrel (PLAVIX) 75 MG tablet Take 75 mg by mouth daily.  . Continuous Blood Gluc Sensor (FREESTYLE LIBRE 14 DAY SENSOR) MISC 1 each by Other route every 14 (fourteen) days.  . Continuous Blood Gluc Sensor (FREESTYLE LIBRE 14 DAY SENSOR) MISC USE AS DIRECTED EVERY  14  DAYS  . glipiZIDE (GLUCOTROL XL) 5 MG 24 hr tablet TAKE 2 TABLETS BY MOUTH ONCE DAILY WITH BREAKFAST  . Insulin Pen Needle (B-D ULTRAFINE III SHORT PEN) 31G X 8 MM MISC 1 each by Does not apply route as directed.  . insulin regular human CONCENTRATED (HUMULIN R U-500 KWIKPEN) 500 UNIT/ML kwikpen Inject 160 Units into the skin 3 (three) times daily with meals.  Marland Kitchen loperamide (IMODIUM A-D) 2 MG tablet Take 12 mg by mouth daily.  Marland Kitchen losartan-hydrochlorothiazide (HYZAAR) 100-12.5  MG tablet Take 1 tablet by mouth daily.  . metFORMIN (GLUCOPHAGE) 500 MG tablet TAKE 1 TABLET BY MOUTH TWICE DAILY WITH MEALS  . nortriptyline (PAMELOR) 25 MG capsule Take 25 mg by mouth at bedtime.  Marland Kitchen. omeprazole (PRILOSEC) 40 MG capsule Take 40 mg by mouth daily.  . pravastatin (PRAVACHOL) 40 MG tablet Take 1 tablet (40 mg total) by mouth daily.  . pregabalin (LYRICA) 300 MG capsule Take 300 mg by mouth 3 (three) times daily.   Marland Kitchen. pyridOXINE (VITAMIN B-6) 100 MG tablet Take 100 mg by mouth daily.  . [DISCONTINUED] insulin regular human CONCENTRATED (HUMULIN R U-500 KWIKPEN) 500 UNIT/ML kwikpen Inject 150 Units into the skin 3 (three) times daily with meals.   No facility-administered encounter medications on file as of 06/14/2020.    ALLERGIES: Allergies  Allergen Reactions  . Aspirin   .  Darvon [Propoxyphene]   . Ibuprofen   . Penicillins     VACCINATION STATUS:  There is no immunization history on file for this patient.  Diabetes He presents for his follow-up diabetic visit. He has type 2 diabetes mellitus. Onset time: He was diagnosed at approximate age of 41 years. His disease course has been worsening. There are no hypoglycemic associated symptoms. Pertinent negatives for hypoglycemia include no confusion, headaches, pallor or seizures. Associated symptoms include polydipsia and polyuria. Pertinent negatives for diabetes include no blurred vision, no chest pain, no fatigue, no polyphagia and no weakness. There are no hypoglycemic complications. Symptoms are improving. Diabetic complications include peripheral neuropathy. Risk factors for coronary artery disease include dyslipidemia, diabetes mellitus, hypertension, male sex, sedentary lifestyle and tobacco exposure. Current diabetic treatment includes insulin injections and oral agent (dual therapy). He is compliant with treatment most of the time. His weight is increasing steadily. He is following a generally unhealthy diet. When asked about meal planning, he reported none. He never participates in exercise. His home blood glucose trend is fluctuating minimally. His breakfast blood glucose range is generally >200 mg/dl. His lunch blood glucose range is generally >200 mg/dl. His dinner blood glucose range is generally >200 mg/dl. His bedtime blood glucose range is generally >200 mg/dl. His overall blood glucose range is >200 mg/dl. (He presents with slight improvement in his glycemic profile, point-of-care A1c 9.7, overall improving from 10.4%.  He lost his CGM coverage, trying to get a Dexcom.   He denies and did not document hypoglycemia.  ) An ACE inhibitor/angiotensin II receptor blocker is being taken. He sees a podiatrist.Eye exam is current (He is due for his dilated eye exam, promises to do before next visit.).   Hyperlipidemia This is a chronic problem. The current episode started more than 1 year ago. The problem is uncontrolled. Recent lipid tests were reviewed and are high. Exacerbating diseases include diabetes and obesity. Factors aggravating his hyperlipidemia include smoking. Pertinent negatives include no chest pain, myalgias or shortness of breath. Current antihyperlipidemic treatment includes statins. Risk factors for coronary artery disease include dyslipidemia, diabetes mellitus, a sedentary lifestyle and obesity.  Hypertension This is a chronic problem. The current episode started more than 1 year ago. The problem is controlled. Pertinent negatives include no blurred vision, chest pain, headaches, neck pain, palpitations or shortness of breath. Risk factors for coronary artery disease include dyslipidemia, diabetes mellitus, male gender, sedentary lifestyle and smoking/tobacco exposure. Past treatments include angiotensin blockers.    Review of systems  Constitutional: + Progressive weight gain,  current  Body mass index is 39.38 kg/m. , no fatigue,  no subjective hyperthermia, no subjective hypothermia Eyes: no blurry vision, no xerophthalmia ENT: no sore throat, no nodules palpated in throat, no dysphagia/odynophagia, no hoarseness Cardiovascular: no Chest Pain, no Shortness of Breath, no palpitations, no leg swelling Respiratory: no cough, no shortness of breath Gastrointestinal: no Nausea/Vomiting/Diarhhea Musculoskeletal: no muscle/joint aches Skin: no rashes, no hyperemia Neurological: no tremors, no numbness, no tingling, no dizziness Psychiatric: no depression, no anxiety    Objective:    BP 140/85   Pulse 94   Ht 5\' 6"  (1.676 m)   Wt 244 lb (110.7 kg)   BMI 39.38 kg/m   Wt Readings from Last 3 Encounters:  06/14/20 244 lb (110.7 kg)  02/27/20 243 lb 3.2 oz (110.3 kg)  11/17/19 239 lb 1.6 oz (108.5 kg)     Physical Exam- Limited  Constitutional:  Body mass index is  39.38 kg/m. , not in acute distress, normal state of mind Eyes:  EOMI, no exophthalmos Neck: Supple Thyroid: No gross goiter Respiratory: Adequate breathing efforts Musculoskeletal:  + Bilateral lower extremities in a wrap due to venostasis, peripheral arterial disease.  Going to wound care center due to diabetic foot ulcer.  No gross restriction of joint movements Skin:  no rashes, no hyperemia Neurological: no tremor with outstretched hands,     Recent Results (from the past 2160 hour(s))  Basic metabolic panel     Status: Abnormal   Collection Time: 05/18/20 12:00 AM  Result Value Ref Range   BUN 24 (A) 4 - 21   Creatinine 1.3 0.6 - 1.3   Potassium 4.6 3.4 - 5.3  Comprehensive metabolic panel     Status: Abnormal   Collection Time: 05/18/20 12:00 AM  Result Value Ref Range   GFR calc Af Amer 73    GFR calc non Af Amer 63    Calcium 8.6 (A) 8.7 - 10.7  TSH     Status: None   Collection Time: 05/18/20 12:00 AM  Result Value Ref Range   TSH 0.93 0.41 - 5.90    Comment: FREE T4- 1.18  HgB A1c     Status: Abnormal   Collection Time: 06/14/20 11:46 AM  Result Value Ref Range   Hemoglobin A1C 9.7 (A) 4.0 - 5.6 %   HbA1c POC (<> result, manual entry)     HbA1c, POC (prediabetic range)     HbA1c, POC (controlled diabetic range)     CMP Latest Ref Rng & Units 05/18/2020 09/19/2019 06/23/2019  Glucose 65 - 99 mg/dL - - 08/23/2019)  BUN 4 - 21 24(A) 18 16  Creatinine 0.6 - 1.3 1.3 1.4(A) 1.27  Sodium 134 - 144 mmol/L - - 135  Potassium 3.4 - 5.3 4.6 - 4.6  Chloride 96 - 106 mmol/L - - 100  CO2 20 - 29 mmol/L - - 21  Calcium 8.7 - 10.7 8.6(A) - 8.9  Total Protein 6.0 - 8.5 g/dL - - 5.8(L)  Total Bilirubin 0.0 - 1.2 mg/dL - - 0.5  Alkaline Phos 39 - 117 IU/L - - 225(H)  AST 0 - 40 IU/L - - 44(H)  ALT 0 - 44 IU/L - - 73(H)   Lipid Panel     Component Value Date/Time   CHOL 132 09/19/2019 0000   TRIG 271 (A) 09/19/2019 0000   HDL 25 (A) 09/19/2019 0000   LDLCALC 53 09/19/2019 0000      Assessment & Plan:   1. Uncontrolled type 2 diabetes mellitus with other specified complication, with [place]-term current  use of insulin (HCC)  - Patient has currently uncontrolled symptomatic type 2 DM since  54 years of age.  He presents with slight improvement in his glycemic profile, point-of-care A1c 9.7, overall improving from 10.4%.  He lost his CGM coverage, trying to get a Dexcom.   He denies and did not document hypoglycemia.    -Recent labs reviewed, showing normal renal function,.  -his diabetes is complicated by peripheral neuropathy, peripheral arterial disease,  sedentary life and Brayden E Leflore remains at extremely  high risk for more acute and chronic complications which include CAD, CVA, CKD, retinopathy, and neuropathy. These are all discussed in detail with the patient.  - I have counseled him on diet management and weight loss, by adopting a carbohydrate restricted/protein rich diet.  - he  admits there is a room for improvement in his diet and drink choices. -  Suggestion is made for him to avoid simple carbohydrates  from his diet including Cakes, Sweet Desserts / Pastries, Ice Cream, Soda (diet and regular), Sweet Tea, Candies, Chips, Cookies, Sweet Pastries,  Store Bought Juices, Alcohol in Excess of  1-2 drinks a day, Artificial Sweeteners, Coffee Creamer, and "Sugar-free" Products. This will help patient to have stable blood glucose profile and potentially avoid unintended weight gain.  - I encouraged him to switch to  unprocessed or minimally processed complex starch and increased protein intake (animal or plant source), fruits, and vegetables.  - he is advised to stick to a routine mealtimes to eat 3 meals  a day and avoid unnecessary snacks ( to snack only to correct hypoglycemia).    - I have approached him with the following individualized plan to manage diabetes and patient agrees:  -Given his presentation with still significantly above target glycemic  profile, he is being considered for a higher dose of insulin.    -He will be considered for Dexcom CGM.  This patient has been benefiting from a CGM device from [place] until he lost coverage.    -He is advised to increase his insulin U50 0 to 160 units 3 times a day with meals,  for pre-meal blood glucose readings above 90 mg/dl.   -Insulin injection technique is redemonstrated for him.  -He is warned not to take insulin  without proper monitoring per orders. -Patient is encouraged to call clinic for blood glucose levels less than 70 or above 300 mg /dl. -He is advised to continue Metformin 500 mg p.o. twice daily--after breakfast and supper.   - He is advised to continue glipizide 10 mg XL daily at breakfast.   -He will greatly benefit from weight loss surgery-see below.    - Patient specific target  A1c;  LDL, HDL, Triglycerides were discussed in detail.  2) BP/HTN:  His blood pressure is controlled to target.  He is advised to continue blood pressure medications including Hyzaar.   3) Lipids/HPL: His recent lipid panel showed significantly improved lipid panel triglycerides down to 271.   -  He is approached with the fact that better  control of diabetes will help control triglycerides.  He is advised to continue pravastatin 40 mg p.o. nightly as well as fenofibrate 140 mg p.o. nightly.     4)  Weight/Diet: His BMI 39.38-clearly complicating his diabetes care.  He has started the process of bariatric surgery. He would greatly benefit from this procedure, and is encouraged to continue to pursue.    In the meantime, CDE Consult  in progress , exercise as tolerated,  and detailed carbohydrates information provided.  5) Chronic Care/Health Maintenance:  -he  is on ACEI/ARB and Statin medications and  is encouraged to continue to follow up with Ophthalmology (he is significantly overdue), Dentist,  Podiatrist at least yearly or according to recommendations, and advised to  stay away from  smoking. I have recommended yearly flu vaccine and pneumonia vaccination at least every 5 years; moderate intensity exercise for up to 150 minutes weekly; and  sleep for at least 7 hours a day.  -Reportedly, he had ABI done at wound care center in [place] which showed bilateral peripheral arterial disease.  - I advised patient to maintain close follow up with Richarda Blade E for primary care needs.  - Time spent on this patient care encounter:  45 min, of which > 50% was spent in  counseling and the rest reviewing his blood glucose logs , discussing his hypoglycemia and hyperglycemia episodes, reviewing his current and  previous labs / studies  ( including abstraction from other facilities) and medications  doses and developing a  [place] term treatment plan and documenting his care.   Please refer to Patient Instructions for Blood Glucose Monitoring and Insulin/Medications Dosing Guide"  in media tab for additional information. Please  also refer to " Patient Self Inventory" in the Media  tab for reviewed elements of pertinent patient history.  Rinaldo Ratel Mcclimans participated in the discussions, expressed understanding, and voiced agreement with the above plans.  All questions were answered to his satisfaction. he is encouraged to contact clinic should he have any questions or concerns prior to his return visit.   Follow up plan: - Return in about 3 months (around 09/13/2020) for Bring Meter and Logs- A1c in Office, Urine MA - [place].  Marquis Lunch, MD Phone: [phone]  Fax: [phone]   06/14/2020, 5:12 PM   This note was partially dictated with voice recognition software. Similar sounding words can be transcribed inadequately or may not  be corrected upon review.

## 2020-06-14 NOTE — Progress Notes (Signed)
ABI done at [facility] wound center 2021

## 2020-06-14 NOTE — Patient Instructions (Signed)

## 2020-06-26 ENCOUNTER — Ambulatory Visit (HOSPITAL_COMMUNITY)
Admission: RE | Admit: 2020-06-26 | Discharge: 2020-06-26 | Disposition: A | Discharge status: Home / Self Care | Payer: Medicare Other | Source: Ambulatory Visit | Attending: General Surgery | Admitting: General Surgery

## 2020-06-26 ENCOUNTER — Other Ambulatory Visit: Payer: Self-pay

## 2020-06-26 DIAGNOSIS — E669 Obesity, unspecified: Secondary | ICD-10-CM | POA: Diagnosis present

## 2020-06-28 ENCOUNTER — Ambulatory Visit (INDEPENDENT_AMBULATORY_CARE_PROVIDER_SITE_OTHER): Payer: Medicare Other | Admitting: Cardiology

## 2020-06-28 ENCOUNTER — Encounter: Payer: Self-pay | Admitting: Cardiology

## 2020-06-28 ENCOUNTER — Other Ambulatory Visit: Payer: Self-pay

## 2020-06-28 VITALS — BP 124/76 | HR 115 | Ht 66.0 in | Wt 246.0 lb

## 2020-06-28 DIAGNOSIS — E782 Mixed hyperlipidemia: Secondary | ICD-10-CM

## 2020-06-28 DIAGNOSIS — Z01810 Encounter for preprocedural cardiovascular examination: Secondary | ICD-10-CM | POA: Diagnosis not present

## 2020-06-28 DIAGNOSIS — I10 Essential (primary) hypertension: Secondary | ICD-10-CM

## 2020-06-28 DIAGNOSIS — Z7189 Other specified counseling: Secondary | ICD-10-CM

## 2020-06-28 DIAGNOSIS — E1169 Type 2 diabetes mellitus with other specified complication: Secondary | ICD-10-CM | POA: Diagnosis not present

## 2020-06-28 DIAGNOSIS — E669 Obesity, unspecified: Secondary | ICD-10-CM

## 2020-06-28 NOTE — Patient Instructions (Signed)
Medication Instructions:  Your Physician recommend you continue on your current medication as directed.    *If you need a refill on your cardiac medications before your next appointment, please call your pharmacy*   Lab Work: None ordered   Testing/Procedures: None ordered    Follow-Up: At [facility], you and your health needs are our priority.  As part of our continuing mission to provide you with exceptional heart care, we have created designated Provider Care Teams.  These Care Teams include your primary Cardiologist (physician) and Advanced Practice Providers (APPs -  Physician Assistants and Nurse Practitioners) who all work together to provide you with the care you need, when you need it.  We recommend signing up for the patient portal called "MyChart".  Sign up information is provided on this After Visit Summary.  MyChart is used to connect with patients for Virtual Visits (Telemedicine).  Patients are able to view lab/test results, encounter notes, upcoming appointments, etc.  Non-urgent messages can be sent to your provider as well.   To learn more about what you can do with MyChart, go to [email or web address].    Your next appointment:   As needed  The format for your next appointment:   In Person  Provider:   Bridgette Christopher, MD    

## 2020-06-28 NOTE — Progress Notes (Signed)
Cardiology Office Note:    Date:  06/28/2020   ID:  Russell Clark, DOB 1966-03-22, MRN [id number]  PCP:  Russell Clark E  Cardiologist:  No primary care provider on file.  Referring MD: Russell Adu, MD   CC: new patient evaluation for preoperative cardiovascular evaluation  History of Present Illness:    Russell Clark is a 54 y.o. male with a hx of obesity, hypertension, type II diabetes who is seen as a new consult at the request of Russell Adu, MD for preoperative cardiovascular evaluation.  Reviewed Dr. Tawana Scale note from 06/25/20.  Planned surgery: Sleeve gastrectomy with Dr. Andrey Campanile, date TBD  Pertinent past cardiac history: none Prior cardiac workup: has had several stress tests in [place], last done several years ago. Stress tests have always been normal. History of valve disease: none History of CAD/PAD/CVA/TIA: none specifically, does have some vein disease. Has skin changes consistent with chronic venous insufficiency. Wears compression stockings.  History of heart failure: none History of arrhythmia: none On anticoagulation: only clopidogrel for vein issues History of hypertension: yes, well controlled today History of diabetes: yes, working on control History of CKD:  None that he knows of History of OSA: yes, uses CPAP History of anesthesia complications: none Current symptoms: Denies chest pain, shortness of breath at rest or with normal exertion. No PND, orthopnea, or unexpected weight gain. No syncope or palpitations. Functional capacity: can climb stairs, but pain in his feet from the neuropathy is the most limiting factor.   Past Medical History:  Diagnosis Date  . Diabetes mellitus, type II (HCC)   . Hyperlipidemia   . Hypertension     Past Surgical History:  Procedure Laterality Date  . OTHER SURGICAL HISTORY     L foot, Cataracts    Current Medications: Current Outpatient Medications on File Prior to Visit  Medication Sig  . buPROPion  (WELLBUTRIN SR) 150 MG 12 hr tablet Take 150 mg by mouth 2 (two) times daily.  . Cholecalciferol (VITAMIN D PO) Take by mouth.  . cilostazol (PLETAL) 50 MG tablet 2 (two) times daily.  . clopidogrel (PLAVIX) 75 MG tablet Take 75 mg by mouth daily.  . Continuous Blood Gluc Receiver (DEXCOM G6 RECEIVER) DEVI 1 Piece by Does not apply route as needed.  . Continuous Blood Gluc Sensor (DEXCOM G6 SENSOR) MISC 4 Pieces by Does not apply route once a week.  . Continuous Blood Gluc Transmit (DEXCOM G6 TRANSMITTER) MISC 1 Piece by Does not apply route as directed.  . furosemide (LASIX) 20 MG tablet Take 20 mg by mouth every morning.  Marland Kitchen glipiZIDE (GLUCOTROL XL) 5 MG 24 hr tablet TAKE 2 TABLETS BY MOUTH ONCE DAILY WITH BREAKFAST  . Insulin Pen Needle (B-D ULTRAFINE III SHORT PEN) 31G X 8 MM MISC 1 each by Does not apply route as directed.  . insulin regular human CONCENTRATED (HUMULIN R U-500 KWIKPEN) 500 UNIT/ML kwikpen Inject 160 Units into the skin 3 (three) times daily with meals.  Marland Kitchen loperamide (IMODIUM A-D) 2 MG tablet Take 2 mg by mouth daily.   Marland Kitchen losartan-hydrochlorothiazide (HYZAAR) 100-12.5 MG tablet Take 1 tablet by mouth daily.  . metFORMIN (GLUCOPHAGE) 500 MG tablet TAKE 1 TABLET BY MOUTH TWICE DAILY WITH MEALS  . nortriptyline (PAMELOR) 25 MG capsule Take 25 mg by mouth as needed.   Marland Kitchen omeprazole (PRILOSEC) 40 MG capsule Take 40 mg by mouth daily.  . pravastatin (PRAVACHOL) 40 MG tablet Take 1 tablet (40 mg total) by mouth  daily. (Patient taking differently: Take 80 mg by mouth daily. )  . pregabalin (LYRICA) 300 MG capsule Take 300 mg by mouth 2 (two) times daily.   Marland Kitchen. pyridOXINE (VITAMIN B-6) 100 MG tablet Take 100 mg by mouth daily.  Marland Kitchen. VITAMIN D, ERGOCALCIFEROL, PO Take by mouth.   No current facility-administered medications on file prior to visit.     Allergies:   Aspirin, Darvon [propoxyphene], Ibuprofen, and Penicillins   Social History   Tobacco Use  . Smoking status: Former  Smoker    Packs/day: 1.00    Years: 25.00    Pack years: 25.00    Types: Cigarettes  . Smokeless tobacco: Never Used  Vaping Use  . Vaping Use: Never used  Substance Use Topics  . Alcohol use: No  . Drug use: No    Family History: family history includes Cancer in his father; Lupus in his mother.  ROS:   Please see the history of present illness.  Additional pertinent ROS: Constitutional: Negative for chills, fever, night sweats, unintentional weight loss  HENT: Negative for ear pain and hearing loss.   Eyes: Negative for loss of vision and eye pain.  Respiratory: Negative for cough, sputum, wheezing.   Cardiovascular: See HPI. Gastrointestinal: Negative for abdominal pain, melena, and hematochezia.  Genitourinary: Negative for dysuria and hematuria.  Musculoskeletal: Negative for falls and myalgias.  Skin: Negative for itching and rash.  Neurological: Negative for focal weakness and loss of consciousness. Has severe neuropathy in hands and feet, has had carpal tunnel surgery and symptoms are actually worse. Endo/Heme/Allergies: Does not bruise/bleed easily.     EKGs/Labs/Other Studies Reviewed:    The following studies were reviewed today: No prior cardiac studies available to me  EKG:  EKG is personally reviewed.  The ekg ordered 06/26/20 demonstrates NSR  Recent Labs: 05/18/2020: BUN 24; Creatinine 1.3; Potassium 4.6; TSH 0.93  Recent Lipid Panel    Component Value Date/Time   CHOL 132 09/19/2019 0000   TRIG 271 (A) 09/19/2019 0000   HDL 25 (A) 09/19/2019 0000   LDLCALC 53 09/19/2019 0000    Physical Exam:    VS:  BP 124/76   Pulse (!) 115   Ht 5\' 6"  (1.676 m)   Wt 246 lb (111.6 kg)   SpO2 93%   BMI 39.71 kg/m     Wt Readings from Last 3 Encounters:  06/28/20 246 lb (111.6 kg)  06/14/20 244 lb (110.7 kg)  02/27/20 243 lb 3.2 oz (110.3 kg)    GEN: Well nourished, well developed in no acute distress HEENT: Normal, moist mucous membranes NECK: No  JVD CARDIAC: regular rhythm, normal S1 and S2, no rubs or gallops. No murmurs. VASCULAR: Radial and DP pulses 2+ bilaterally. No carotid bruits RESPIRATORY:  Clear to auscultation without rales, wheezing or rhonchi  ABDOMEN: Soft, non-tender, non-distended MUSCULOSKELETAL:  Ambulates independently SKIN: Warm and dry, bilateral 1+ pitting edema with chronic changes NEUROLOGIC:  Alert and oriented x 3. No focal neuro deficits noted. PSYCHIATRIC:  Normal affect    ASSESSMENT:    1. Preop cardiovascular exam   2. Essential hypertension   3. Mixed hyperlipidemia   4. Diabetes mellitus type 2 in obese (HCC)   5. Cardiac risk counseling   6. Counseling on health promotion and disease prevention    PLAN:    Preoperative cardiovascular risk assessment: Based on available date, patient's RCRI score = 1, which carries a 6% 30-day risk of death, MI, or cardiac arrest. His score  is 1 due to being on insulin.  The patient is not currently having active cardiac symptoms, and they can achieve >4 METs of activity.  According to ACC/AHA Guidelines, no further testing is needed.  Proceed with surgery at acceptable risk.  Our service is available as needed in the peri-operative period.   His biggest risk is related to his diabetes. He has improved control but still significantly elevated sugars. On high dose insulin.  Hypertension: -well controlled today -continue losartan-HCTZ -has been on and off furosemide due to LE edema and kidney function  Hyperlipidemia -lipids from 09/19/19 per [facility]: Tchol 132, HDL 25, LDL 53, TG 271. Unknown if fasting -on pravastatin 80 mg. Would consider intensifying in the future  Type II diabetes with obesity: -on high dose insulin (160 units TID) as well as glipizide and metformin  Venous insufficiency: -managed by vein clinic through [facility] in [place] -on cilostazol per their recommendations. He has good pulses, and I cannot see evidence of prior PAD. Per note, has  CTA with runoff without obstructive disease in 2019 -on clopidogrel as well.   Cardiac risk counseling and prevention recommendations: -recommend heart healthy/Mediterranean diet, with whole grains, fruits, vegetable, fish, lean meats, nuts, and olive oil. Limit salt. -recommend moderate walking, 3-5 times/week for 30-50 minutes each session. Aim for at least 150 minutes.week. Goal should be pace of 3 miles/hours, or walking 1.5 miles in 30 minutes -recommend avoidance of tobacco products. Avoid excess alcohol. -ASCVD risk score: The 10-year ASCVD risk score Denman George DC Montez Hageman., et al., 2013) is: 10.1%*   Values used to calculate the score:     Age: 89 years     Sex: Male     Is Non-Hispanic African American: No     Diabetic: Yes     Tobacco smoker: No     Systolic Blood Pressure: 124 mmHg     Is BP treated: Yes     HDL Cholesterol: 25 mg/dL*     Total Cholesterol: 132 mg/dL*     * - Cholesterol units were assumed for this score calculation    Plan for follow up: This is a far drive for him, referred due to bariatric surgery evaluation. He would prefer to be seen closer to home, but I am happy to see him again if needed  Jodelle Red, MD, PhD [place]  [facility]    Medication Adjustments/Labs and Tests Ordered: Current medicines are reviewed at length with the patient today.  Concerns regarding medicines are outlined above.  No orders of the defined types were placed in this encounter.  No orders of the defined types were placed in this encounter.   Patient Instructions  Medication Instructions:  Your Physician recommend you continue on your current medication as directed.    *If you need a refill on your cardiac medications before your next appointment, please call your pharmacy*   Lab Work: None ordered   Testing/Procedures: None ordered    Follow-Up: At [facility], you and your health needs are our priority.  As part of our continuing mission to  provide you with exceptional heart care, we have created designated Provider Care Teams.  These Care Teams include your primary Cardiologist (physician) and Advanced Practice Providers (APPs -  Physician Assistants and Nurse Practitioners) who all work together to provide you with the care you need, when you need it.  We recommend signing up for the patient portal called "MyChart".  Sign up information is provided on this After Visit  Summary.  MyChart is used to connect with patients for Virtual Visits (Telemedicine).  Patients are able to view lab/test results, encounter notes, upcoming appointments, etc.  Non-urgent messages can be sent to your provider as well.   To learn more about what you can do with MyChart, go to [email or web address].    Your next appointment:   As needed  The format for your next appointment:   In Person  Provider:   Jodelle Red, MD       Signed, Jodelle Red, MD PhD 06/28/2020   [facility]

## 2020-07-01 ENCOUNTER — Encounter: Payer: Self-pay | Admitting: Cardiology

## 2020-07-04 ENCOUNTER — Encounter: Payer: Medicare Other | Attending: General Surgery | Admitting: Skilled Nursing Facility1

## 2020-07-04 ENCOUNTER — Encounter: Payer: Self-pay | Admitting: Skilled Nursing Facility1

## 2020-07-04 ENCOUNTER — Other Ambulatory Visit: Payer: Self-pay

## 2020-07-04 DIAGNOSIS — E1165 Type 2 diabetes mellitus with hyperglycemia: Secondary | ICD-10-CM | POA: Diagnosis present

## 2020-07-04 NOTE — Progress Notes (Signed)
Nutrition Assessment for Bariatric Surgery Medical Nutrition Therapy  Patient was seen on 07/04/2020 for Pre-Operative Nutrition Assessment. Letter of approval faxed to [facility] bariatric surgery program coordinator on 07/04/2020  Referral stated Supervised Weight Loss (SWL) visits needed: 6  Planned surgery: Sleeve gastrectomy  Pt expectation of surgery: to put diabetes into remission Pt expectation of dietitian: to help educate     NUTRITION ASSESSMENT   Anthropometrics  Start weight at [facility]: 240.5 lbs (date: 07/04/2020)  Height: 65.6in BMI: 38.20 kg/m2     Clinical  Medical hx: Diabetes, neuropathy Medications: meal time in[place]lin Labs:  Notable signs/symptoms: Dexcom continuous monitor  Any previous deficiencies? No  Micronutrient Nutrition Focused Physical Exam: Hair: No issues observed Eyes: No issues observed Mouth: No issues observed Neck: No issues observed Nails: No issues observed Skin: No issues observed  Lifestyle & Dietary Hx  Pt states his baby grandson lives with him and his son and daughter his son being 66 years old. Pt states life is hectic. Pt states having the baby at home give shim something to do. Pt states he has diabetic neuropathy really bad with extreme pain. Pt state she knows it was the insulin that caused him to gain weight: dietitian educated pt on this topic with the use of tangible objects and pictures. Pt states his doctor advised him not to cook due to lack of feeling in his hands.  Pt states his blood sugar stays in the high 300's and into 400's.  Pt states when he does not eat he does not take his insulin  Pt states from losing his teeth his foods have to be soft.  Pt states if his blood sugars were under 150 he feels he is going to pass out.  Pt states he was going to the wound clinic for ulcers on his legs. 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  Pt states he has been skipping meals to lose weight: Dietitian educated pt on  this impacting his blood sugars negativly.   Pt states he is hard headed.  Pt states his aunt cooks his dinners and he cooks his own breakfast and lunch.  Pt states he tries to stay away from salt. Pt states he is forgetful now and loses concentration.   24-Hr Dietary Recall First Meal: 3 bacon and 3-4 eggs or skipped Snack:  Second Meal: skipped or ham sandwich  Snack:  Third Meal: chicken casserole Snack: fruit Beverages: water   Estimated Energy Needs Calories: 1600   NUTRITION DIAGNOSIS  Overweight/obesity ([place]-3.3) related to past poor dietary habits and physical inactivity as evidenced by patient w/ planned sleeve gastrectomy surgery following dietary guidelines for continued weight loss.    NUTRITION INTERVENTION  Nutrition counseling (C-1) and education (E-2) to facilitate bariatric surgery goals.   Pre-Op Goals Reviewed with the Patient . Track food and beverage intake (pen and paper, MyFitness Pal, Baritastic app, etc.) . Make healthy food choices while monitoring portion sizes . Consume 3 meals per day or try to eat every 3-5 hours . Avoid concentrated sugars and fried foods . Keep sugar & fat in the single digits per serving on food labels . Practice CHEWING your food (aim for applesauce consistency) . Practice not drinking 15 minutes before, during, and 30 minutes after each meal and snack . Avoid all carbonated beverages (ex: soda, sparkling beverages)  . Limit caffeinated beverages (ex: coffee, tea, energy drinks) . Avoid all sugar-sweetened beverages (ex: regular soda, sports drinks)  . Avoid  alcohol  . Aim for 64-100 ounces of FLUID daily (with at least half of fluid intake being plain water)  . Aim for at least 60-80 grams of PROTEIN daily . Look for a liquid protein source that contains ?15 g protein and ?5 g carbohydrate (ex: shakes, drinks, shots) . Make a list of non-food related activities . Physical activity is an important part of a healthy lifestyle  so keep it moving! The goal is to reach 150 minutes of exercise per week, including cardiovascular and weight baring activity.  Goals: Write down everything that you put into your mouth: food and drink or take a picture of everything before you eat or drink it Eat 3 meals a day every day  Do at least 1 set of armchair works every day  *Goals that are bolded indicate the pt would like to start working towards these  Handouts Provided Include  . Bariatric Surgery handouts (Nutrition Visits, Pre-Op Goals, Protein Shakes, Vitamins & Minerals)  Learning Style & Readiness for Change Teaching method utilized: Visual & Auditory  Demonstrated degree of understanding via: Teach Back  Barriers to learning/adherence to lifestyle change: pre contemplative stage of change  RD's Notes for Next Visit . Assess pts adherence to chosen goals     MONITORING & EVALUATION Dietary intake, weekly physical activity, body weight, and pre-op goals reached at next nutrition visit.    Next Steps  Patient is to follow up at [facility] for Pre-Op Class >2 weeks before surgery for further nutrition education.

## 2020-07-18 ENCOUNTER — Encounter: Payer: Medicare Other | Attending: General Surgery | Admitting: Skilled Nursing Facility1

## 2020-07-18 ENCOUNTER — Other Ambulatory Visit: Payer: Self-pay

## 2020-07-18 DIAGNOSIS — E7800 Pure hypercholesterolemia, unspecified: Secondary | ICD-10-CM | POA: Insufficient documentation

## 2020-07-18 DIAGNOSIS — Z7984 Long term (current) use of oral hypoglycemic drugs: Secondary | ICD-10-CM | POA: Diagnosis not present

## 2020-07-18 DIAGNOSIS — E669 Obesity, unspecified: Secondary | ICD-10-CM

## 2020-07-18 DIAGNOSIS — I10 Essential (primary) hypertension: Secondary | ICD-10-CM | POA: Insufficient documentation

## 2020-07-18 DIAGNOSIS — Z79899 Other long term (current) drug therapy: Secondary | ICD-10-CM | POA: Diagnosis not present

## 2020-07-18 DIAGNOSIS — Z6830 Body mass index (BMI) 30.0-30.9, adult: Secondary | ICD-10-CM | POA: Diagnosis not present

## 2020-07-18 DIAGNOSIS — K219 Gastro-esophageal reflux disease without esophagitis: Secondary | ICD-10-CM | POA: Diagnosis not present

## 2020-07-18 DIAGNOSIS — G4733 Obstructive sleep apnea (adult) (pediatric): Secondary | ICD-10-CM | POA: Diagnosis not present

## 2020-07-18 DIAGNOSIS — E1169 Type 2 diabetes mellitus with other specified complication: Secondary | ICD-10-CM | POA: Insufficient documentation

## 2020-07-18 NOTE — Progress Notes (Signed)
Supervised Weight Loss Visit Bariatric Nutrition Education  Planned Surgery: sleeve Pt Expectation of Surgery/ Goals: to control diabetes  1 out of 6 SWL Appointments   NUTRITION ASSESSMENT  Anthropometrics  Start weight at [facility]: 240.5 lbs (date: 07/18/2020) Today's weight: 240 lbs Weight change: maintianed lbs (since previous visit on 07/18/2020) BMI: 38.22 kg/m2    Clinical  Medical Hx: Uncontrolled diabetes, neuropathy Medications: meal time insulin; dexcom continuous monitor  Labs:  Notable Signs/Symptoms:   Lifestyle & Dietary Hx Pt is doing great with making changes  Pt states his blood sugar was so low the meter would not read the meter woke him up he did not feel it: he ate a swiss roll and drank gingerale (took his insulin 4pm 30 minutes before his dinner). Pt states his endocrinologist said not to take his insulin unless he eats.  Pts blood sugar currently 275.  Estimated daily fluid intake: unknown oz Supplements:  Current average weekly physical activity: ADl's  24-Hr Dietary Recall First Meal: 3 half slices bacon and 1 egg sandwich + mayo or protein shake and fruit Snack: apple Second Meal: skipped Snack: 15 grapes Third Meal: vegetable beef soup or chicken + instant potatoes + corn Snack:  Beverages: PROTEIN SHAKE, WATER  Estimated Energy Needs Calories: 1500    NUTRITION DIAGNOSIS  Overweight/obesity ([place]-3.3) related to past poor dietary habits and physical inactivity as evidenced by patient w/ planned sleeve gastrectomy surgery following dietary guidelines for continued weight loss.   NUTRITION INTERVENTION  Nutrition counseling (C-1) and education (E-2) to facilitate bariatric surgery goals.  Pre-Op Goals Progress & New Goals  NEW: If another low: drink half can Gingerale check Dexcom 15 minutes later if in the 100's eat 2 spoonfuls of peanut butter, if it is not in the 100's repeat first step until you get it there   NEW: Be sure to eat a  minimum of 3 meals a day in order to take your insulin THIS IS VERY IMPORTANT  Continue: Write down everything that you put into your mouth: food and drink or take a picture of everything before you eat or drink it  Continue: Eat 3 meals a day every day   Continue: Do at least 1 set of armchair works every day  Handouts Provided Include     Learning Style & Readiness for Change Teaching method utilized: Visual & Auditory  Demonstrated degree of understanding via: Teach Back  Barriers to learning/adherence to lifestyle change:   RD's Notes for next Visit   Assess pts adherence to chosen goals   MONITORING & EVALUATION Dietary intake, weekly physical activity, body weight, and pre-op goals in 1 month.   Next Steps  Patient is to return to [facility] in 1 month

## 2020-08-15 ENCOUNTER — Other Ambulatory Visit: Payer: Self-pay

## 2020-08-15 ENCOUNTER — Encounter: Payer: Medicare Other | Attending: General Surgery | Admitting: Skilled Nursing Facility1

## 2020-08-15 DIAGNOSIS — E669 Obesity, unspecified: Secondary | ICD-10-CM | POA: Diagnosis present

## 2020-08-15 DIAGNOSIS — E1165 Type 2 diabetes mellitus with hyperglycemia: Secondary | ICD-10-CM | POA: Diagnosis present

## 2020-08-15 NOTE — Progress Notes (Signed)
Supervised Weight Loss Visit Bariatric Nutrition Education  Planned Surgery: sleeve Pt Expectation of Surgery/ Goals: to control diabetes  2 out of 6 SWL Appointments   NUTRITION ASSESSMENT  Anthropometrics  Start weight at [facility]: 240.5 lbs (date: 07/18/2020) Today's weight: 233.7 lbs Weight change: maintianed lbs (since previous visit on 07/18/2020) BMI: 37.16 kg/m2    Clinical  Medical Hx: Uncontrolled diabetes, neuropathy Medications: meal time insulin; dexcom continuous monitor  Labs:  Notable Signs/Symptoms:   Lifestyle & Dietary Hx Pt is doing great with making changes  Pt states he has had the same blood glucose number he always does which is out of range high with one low blood glucose. Pts current blood glucose reads as high (over 400) and he feels fine. Pt states he has not taken his insulin today due to coming to this appointment and dropping his son at school. Dietitian educated pt on the need to take his insulin every day. Pt states he is having short term memory loss which worries him. Dietitian suggest seeing a neurologist. Pt states his hands were shaking stating he is unable to hold the remote and glass. Pt states he logged his food but forgot it in the truck.   Pt states he has no top teeth so he eats slowly with small bites.   Estimated daily fluid intake: unknown oz Supplements:  Current average weekly physical activity: ADl's  24-Hr Dietary Recall First Meal: apple or grapes + protein shake Snack:  Second Meal: ham sandwiches + mayonase Snack: 15 grapes Third Meal: pinto beans + homemade sausage Snack:  Beverages: protein shake, water  Estimated Energy Needs Calories: 1500    NUTRITION DIAGNOSIS  Overweight/obesity ([place]-3.3) related to past poor dietary habits and physical inactivity as evidenced by patient w/ planned sleeve gastrectomy surgery following dietary guidelines for continued weight loss.   NUTRITION INTERVENTION  Nutrition counseling  (C-1) and education (E-2) to facilitate bariatric surgery goals.  Pre-Op Goals Progress & New Goals  Continue: If another low: drink half can Gingerale check Dexcom 15 minutes later if in the 100's eat 2 spoonfuls of peanut butter, if it is not in the 100's repeat first step until you get it there   Continue: Be sure to eat a minimum of 3 meals a day and take your insulin THIS IS VERY IMPORTANT  Continue: Write down everything that you put into your mouth: food and drink or take a picture of everything before you eat or drink it  Continue: Eat 3 meals a day every day   Continue: Do at least 1 set of armchair works every day  Do not drink fluid 15 min before, during and after meal  Handouts Provided Include     Learning Style & Readiness for Change Teaching method utilized: Visual & Auditory  Demonstrated degree of understanding via: Teach Back  Barriers to learning/adherence to lifestyle change: pre contemplative stage of change  RD's Notes for next Visit   Assess pts adherence to chosen goals   MONITORING & EVALUATION Dietary intake, weekly physical activity, body weight, and pre-op goals in 1 month.   Next Steps  Patient is to return to [facility] in 1 month

## 2020-09-12 ENCOUNTER — Other Ambulatory Visit: Payer: Self-pay

## 2020-09-12 ENCOUNTER — Encounter: Payer: Medicare Other | Admitting: Skilled Nursing Facility1

## 2020-09-12 DIAGNOSIS — E669 Obesity, unspecified: Secondary | ICD-10-CM

## 2020-09-12 DIAGNOSIS — E1165 Type 2 diabetes mellitus with hyperglycemia: Secondary | ICD-10-CM

## 2020-09-12 NOTE — Progress Notes (Signed)
Supervised Weight Loss Visit Bariatric Nutrition Education  Planned Surgery: sleeve Pt Expectation of Surgery/ Goals: to control diabetes  3 out of 6 SWL Appointments   NUTRITION ASSESSMENT  Anthropometrics  Start weight at [facility]: 240.5 lbs (date: 07/18/2020) Today's weight: 236.9 lbs Weight change: +3 lbs (since previous visit on 07/18/2020) BMI: 37.66 kg/m2    Clinical  Medical Hx: Uncontrolled diabetes, neuropathy Medications: meal time insulin; dexcom continuous monitor  Labs:  Notable Signs/Symptoms:   Lifestyle & Dietary Hx Pt is doing great with making changes  Pt states he has had the same blood glucose number he always does which is out of range high with one low blood glucose. Pts current blood glucose reads as high (over 400) and he feels fine. Dietitian educated pt on the need to take his insulin every day. Pt states he is having short term memory loss which worries him. Dietitian suggest seeing a neurologist. Pt states his hands were shaking stating he is unable to hold the remote and glass.  Pt states he has no top teeth so he eats slowly with small bites.  Pt states he fell off his truck bed causing some leg pain and states he hit his head which knocked him out stating he does not remember much (knocked him out).    Pt arrives having logged his foods.  Pt states his arm was tested for the lack of grip ans found his nerves have not healed from his surgery. Pt states he has to have his water ice cold. Pt state she tries to do more walking but it hurts his lower back so much he cannot continue.  Estimated daily fluid intake: 64+ oz Supplements:  Current average weekly physical activity: ADl's  24-Hr Dietary Recall First Meal: apple or grapes + protein shake Snack:  Second Meal: ham sandwiches + mayonnaise or fast food (slugged this once) Snack: 15 grapes Third Meal: frozen meals Snack:  Beverages: protein shake, plain water  Estimated Energy Needs Calories:  1500    NUTRITION DIAGNOSIS  Overweight/obesity ([place]-3.3) related to past poor dietary habits and physical inactivity as evidenced by patient w/ planned sleeve gastrectomy surgery following dietary guidelines for continued weight loss.   NUTRITION INTERVENTION  Nutrition counseling (C-1) and education (E-2) to facilitate bariatric surgery goals.  Pre-Op Goals Progress & New Goals . Continue: If another low: drink half can [place] check Dexcom 15 minutes later if in the 100's eat 2 spoonfuls of peanut butter, if it is not in the 100's repeat first step until you get it there  . Continue: Be sure to eat a minimum of 3 meals a day and take your insulin THIS IS VERY IMPORTANT . Continue: Write down everything that you put into your mouth: food and drink or take a picture of everything before you eat or drink it . Continue: Eat 3 meals a day every day  . Continue: Do at least 1 set of armchair works every day . Do not drink fluid 15 min before, during and after meal . Do not eat gravy  Handouts Provided Include     Learning Style & Readiness for Change Teaching method utilized: Visual & Auditory  Demonstrated degree of understanding via: Teach Back  Barriers to learning/adherence to lifestyle change: none identified   RD's Notes for next Visit  . Assess pts adherence to chosen goals   MONITORING & EVALUATION Dietary intake, weekly physical activity, body weight, and pre-op goals in 1 month.   Next Steps  Patient is to return to [facility] in 1 month

## 2020-09-13 ENCOUNTER — Ambulatory Visit (INDEPENDENT_AMBULATORY_CARE_PROVIDER_SITE_OTHER): Payer: Medicare Other | Admitting: "Endocrinology

## 2020-09-13 ENCOUNTER — Encounter: Payer: Self-pay | Admitting: "Endocrinology

## 2020-09-13 ENCOUNTER — Other Ambulatory Visit: Payer: Self-pay

## 2020-09-13 VITALS — BP 142/88 | HR 96 | Ht 66.0 in | Wt 240.2 lb

## 2020-09-13 DIAGNOSIS — E1165 Type 2 diabetes mellitus with hyperglycemia: Secondary | ICD-10-CM

## 2020-09-13 DIAGNOSIS — I10 Essential (primary) hypertension: Secondary | ICD-10-CM

## 2020-09-13 DIAGNOSIS — E782 Mixed hyperlipidemia: Secondary | ICD-10-CM

## 2020-09-13 LAB — HEMOGLOBIN A1C: Hemoglobin A1C: 11.4

## 2020-09-13 LAB — POCT GLYCOSYLATED HEMOGLOBIN (HGB A1C): HbA1c, POC (controlled diabetic range): 11.4 % — AB (ref 0.0–7.0)

## 2020-09-13 LAB — POCT UA - MICROALBUMIN
Creatinine, POC: 50 mg/dL
Microalbumin Ur, POC: 10 mg/L

## 2020-09-13 NOTE — Patient Instructions (Signed)
                                     Advice for Weight Management  -For most of us the best way to lose weight is by diet management. Generally speaking, diet management means consuming less calories intentionally which over time brings about progressive weight loss.  This can be achieved more effectively by restricting carbohydrate consumption to the minimum possible.  So, it is critically important to know your numbers: how much calorie you are consuming and how much calorie you need. More importantly, our carbohydrates sources should be unprocessed or minimally processed complex starch food items.   Sometimes, it is important to balance nutrition by increasing protein intake (animal or plant source), fruits, and vegetables.  -Sticking to a routine mealtime to eat 3 meals a day and avoiding unnecessary snacks is shown to have a big role in weight control. Under normal circumstances, the only time we lose real weight is when we are hungry, so allow hunger to take place- hunger means no food between meal times, only water.  It is not advisable to starve.   -It is better to avoid simple carbohydrates including: Cakes, Sweet Desserts, Ice Cream, Soda (diet and regular), Sweet Tea, Candies, Chips, Cookies, Store Bought Juices, Alcohol in Excess of  1-2 drinks a day, Lemonade,  Artificial Sweeteners, Doughnuts, Coffee Creamers, "Sugar-free" Products, etc, etc.  This is not a complete list.....    -Consulting with certified diabetes educators is proven to provide you with the most accurate and current information on diet.  Also, you may be  interested in discussing diet options/exchanges , we can schedule a visit with Russell Clark, RDN, CDE for individualized nutrition education.  -Exercise: If you are able: 30 -60 minutes a day ,4 days a week, or 150 minutes a week.  The longer the better.  Combine stretch, strength, and aerobic activities.  If you were told in the  past that you have high risk for cardiovascular diseases, you may seek evaluation by your heart doctor prior to initiating moderate to intense exercise programs.                                  Additional Care Considerations for Diabetes   -Diabetes  is a chronic disease.  The most important care consideration is regular follow-up with your diabetes care provider with the goal being avoiding or delaying its complications and to take advantage of advances in medications and technology.    -Type 2 diabetes is known to coexist with other important comorbidities such as high blood pressure and high cholesterol.  It is critical to control not only the diabetes but also the high blood pressure and high cholesterol to minimize and delay the risk of complications including coronary artery disease, stroke, amputations, blindness, etc.    - Studies showed that people with diabetes will benefit from a class of medications known as ACE inhibitors and statins.  Unless there are specific reasons not to be on these medications, the standard of care is to consider getting one from these groups of medications at an optimal doses.  These medications are generally considered safe and proven to help protect the heart and the kidneys.    - People with diabetes are encouraged to initiate and maintain regular follow-up with eye doctors, foot   doctors, dentists , and if necessary heart and kidney doctors.     - It is highly recommended that people with diabetes quit smoking or stay away from smoking, and get yearly  flu vaccine and pneumonia vaccine at least every 5 years.  One other important lifestyle recommendation is to ensure adequate sleep - at least 6-7 hours of uninterrupted sleep at night.  -Exercise: If you are able: 30 -60 minutes a day, 4 days a week, or 150 minutes a week.  The longer the better.  Combine stretch, strength, and aerobic activities.  If you were told in the past that you have high risk for  cardiovascular diseases, you may seek evaluation by your heart doctor prior to initiating moderate to intense exercise programs.          

## 2020-09-13 NOTE — Progress Notes (Signed)
09/13/2020   Endocrinology follow-up note    Subjective:    Patient ID: Russell Clark, male    DOB: 11/01/1965.  he is being seen in follow-up for management of currently uncontrolled symptomatic type 2 diabetes, hyperlipidemia, hypertension. PCP:  Garald BraverElliott, Dianne E.   Past Medical History:  Diagnosis Date  . Diabetes mellitus, type II (HCC)   . Hyperlipidemia   . Hypertension    Past Surgical History:  Procedure Laterality Date  . OTHER SURGICAL HISTORY     L foot, Cataracts   Social History   Socioeconomic History  . Marital status: Divorced    Spouse name: Not on file  . Number of children: Not on file  . Years of education: Not on file  . Highest education level: Not on file  Occupational History  . Not on file  Tobacco Use  . Smoking status: Former Smoker    Packs/day: 1.00    Years: 25.00    Pack years: 25.00    Types: Cigarettes  . Smokeless tobacco: Never Used  Vaping Use  . Vaping Use: Never used  Substance and Sexual Activity  . Alcohol use: No  . Drug use: No  . Sexual activity: Not on file  Other Topics Concern  . Not on file  Social History Narrative  . Not on file   Social Determinants of Health   Financial Resource Strain: Not on file  Food Insecurity: Not on file  Transportation Needs: Not on file  Physical Activity: Not on file  Stress: Not on file  Social Connections: Not on file   Outpatient Encounter Medications as of 09/13/2020  Medication Sig  . Lidocaine 4 % LOTN Apply topically.  Marland Kitchen. buPROPion (WELLBUTRIN SR) 150 MG 12 hr tablet Take 150 mg by mouth 2 (two) times daily.  . Cholecalciferol (VITAMIN D PO) Take by mouth.  . cilostazol (PLETAL) 50 MG tablet 2 (two) times daily.  . clopidogrel (PLAVIX) 75 MG tablet Take 75 mg by mouth daily.  . Continuous Blood Gluc Receiver (DEXCOM G6 RECEIVER) DEVI 1 Piece by Does not apply route as needed.  . Continuous Blood Gluc Sensor (DEXCOM G6 SENSOR) MISC 4 Pieces by Does not apply  route once a week.  . Continuous Blood Gluc Transmit (DEXCOM G6 TRANSMITTER) MISC 1 Piece by Does not apply route as directed.  . furosemide (LASIX) 20 MG tablet Take 20 mg by mouth every morning.  Marland Kitchen. glipiZIDE (GLUCOTROL XL) 5 MG 24 hr tablet TAKE 2 TABLETS BY MOUTH ONCE DAILY WITH BREAKFAST  . Insulin Pen Needle (B-D ULTRAFINE III SHORT PEN) 31G X 8 MM MISC 1 each by Does not apply route as directed.  . insulin regular human CONCENTRATED (HUMULIN R U-500 KWIKPEN) 500 UNIT/ML kwikpen Inject 160 Units into the skin 3 (three) times daily with meals.  Marland Kitchen. loperamide (IMODIUM A-D) 2 MG tablet Take 2 mg by mouth daily.   Marland Kitchen. losartan-hydrochlorothiazide (HYZAAR) 100-12.5 MG tablet Take 1 tablet by mouth daily.  . metFORMIN (GLUCOPHAGE) 500 MG tablet TAKE 1 TABLET BY MOUTH TWICE DAILY WITH MEALS  . nortriptyline (PAMELOR) 25 MG capsule Take 25 mg by mouth as needed.   Marland Kitchen. omeprazole (PRILOSEC) 40 MG capsule Take 40 mg by mouth daily.  . pravastatin (PRAVACHOL) 40 MG tablet Take 1 tablet (40 mg total) by mouth daily. (Patient taking differently: Take 80 mg by mouth daily. )  . pregabalin (LYRICA) 300 MG capsule Take 300 mg by mouth 2 (two) times daily.   .Marland Kitchen  pyridOXINE (VITAMIN B-6) 100 MG tablet Take 100 mg by mouth daily.  Marland Kitchen VITAMIN D, ERGOCALCIFEROL, PO Take by mouth.   No facility-administered encounter medications on file as of 09/13/2020.    ALLERGIES: Allergies  Allergen Reactions  . Aspirin   . Darvon [Propoxyphene]   . Ibuprofen   . Penicillins     VACCINATION STATUS:  There is no immunization history on file for this patient.  Diabetes He presents for his follow-up diabetic visit. He has type 2 diabetes mellitus. Onset time: He was diagnosed at approximate age of 41 years. His disease course has been worsening. There are no hypoglycemic associated symptoms. Pertinent negatives for hypoglycemia include no confusion, headaches, pallor or seizures. Associated symptoms include polydipsia and  polyuria. Pertinent negatives for diabetes include no blurred vision, no chest pain, no fatigue, no polyphagia and no weakness. There are no hypoglycemic complications. Symptoms are worsening. Diabetic complications include peripheral neuropathy. Risk factors for coronary artery disease include dyslipidemia, diabetes mellitus, hypertension, male sex, sedentary lifestyle and tobacco exposure. Current diabetic treatment includes insulin injections and oral agent (dual therapy). He is compliant with treatment most of the time. His weight is fluctuating minimally (Here in the process of getting clearance for sleeve gastrectomy by different medical specialties.). He is following a generally unhealthy diet. When asked about meal planning, he reported none. He never participates in exercise. His home blood glucose trend is increasing steadily. His breakfast blood glucose range is generally >200 mg/dl. His lunch blood glucose range is generally >200 mg/dl. His dinner blood glucose range is generally >200 mg/dl. His bedtime blood glucose range is generally >200 mg/dl. His overall blood glucose range is >200 mg/dl. (He did not bring his insulin administration logs.  His CGM analysis shows 86% above range, less than 10% within range.  No hypoglycemia.  His average blood glucose is 249.  His point-of-care A1c is 11.4% increasing from 9.7%.   ) An ACE inhibitor/angiotensin II receptor blocker is being taken. He sees a podiatrist.Eye exam is current (He is due for his dilated eye exam, promises to do before next visit.).  Hyperlipidemia This is a chronic problem. The current episode started more than 1 year ago. The problem is uncontrolled. Recent lipid tests were reviewed and are high. Exacerbating diseases include diabetes and obesity. Factors aggravating his hyperlipidemia include smoking. Pertinent negatives include no chest pain, myalgias or shortness of breath. Current antihyperlipidemic treatment includes statins. Risk  factors for coronary artery disease include dyslipidemia, diabetes mellitus, a sedentary lifestyle and obesity.  Hypertension This is a chronic problem. The current episode started more than 1 year ago. The problem is controlled. Pertinent negatives include no blurred vision, chest pain, headaches, neck pain, palpitations or shortness of breath. Risk factors for coronary artery disease include dyslipidemia, diabetes mellitus, male gender, sedentary lifestyle and smoking/tobacco exposure. Past treatments include angiotensin blockers.    Review of systems     Objective:    BP (!) 142/88   Pulse 96   Ht 5\' 6"  (1.676 m)   Wt 240 lb 3.2 oz (109 kg)   BMI 38.77 kg/m   Wt Readings from Last 3 Encounters:  09/13/20 240 lb 3.2 oz (109 kg)  09/12/20 236 lb 14.4 oz (107.5 kg)  08/15/20 233 lb 11.2 oz (106 kg)     Physical Exam- Limited    Recent Results (from the past 2160 hour(s))  POCT UA - Microalbumin     Status: None   Collection Time: 09/13/20 11:24  AM  Result Value Ref Range   Microalbumin Ur, POC 10 mg/L   Creatinine, POC 50 mg/dL   Albumin/Creatinine Ratio, Urine, POC 30-300   HgB A1c     Status: Abnormal   Collection Time: 09/13/20 11:25 AM  Result Value Ref Range   Hemoglobin A1C     HbA1c POC (<> result, manual entry)     HbA1c, POC (prediabetic range)     HbA1c, POC (controlled diabetic range) 11.4 (A) 0.0 - 7.0 %   CMP Latest Ref Rng & Units 05/18/2020 09/19/2019 06/23/2019  Glucose 65 - 99 mg/dL - - 151(V)  BUN 4 - 21 24(A) 18 16  Creatinine 0.6 - 1.3 1.3 1.4(A) 1.27  Sodium 134 - 144 mmol/L - - 135  Potassium 3.4 - 5.3 4.6 - 4.6  Chloride 96 - 106 mmol/L - - 100  CO2 20 - 29 mmol/L - - 21  Calcium 8.7 - 10.7 8.6(A) - 8.9  Total Protein 6.0 - 8.5 g/dL - - 5.8(L)  Total Bilirubin 0.0 - 1.2 mg/dL - - 0.5  Alkaline Phos 39 - 117 IU/L - - 225(H)  AST 0 - 40 IU/L - - 44(H)  ALT 0 - 44 IU/L - - 73(H)   Lipid Panel     Component Value Date/Time   CHOL 132  09/19/2019 0000   TRIG 271 (A) 09/19/2019 0000   HDL 25 (A) 09/19/2019 0000   LDLCALC 53 09/19/2019 0000     Assessment & Plan:   1. Uncontrolled type 2 diabetes mellitus with other specified complication, with [place]-term current use of insulin (HCC)  - Patient has currently uncontrolled symptomatic type 2 DM since  54 years of age.  He did not bring his insulin administration logs.  His CGM analysis shows 86% above range, less than 10% within range.  No hypoglycemia.  His average blood glucose is 249.  His point-of-care A1c is 11.4% increasing from 9.7%.     -Recent labs reviewed, showing normal renal function,.  -his diabetes is complicated by peripheral neuropathy, peripheral arterial disease,  sedentary life and Marcellas E Donoso remains at extremely  high risk for more acute and chronic complications which include CAD, CVA, CKD, retinopathy, and neuropathy. These are all discussed in detail with the patient.  - I have counseled him on diet management and weight loss, by adopting a carbohydrate restricted/protein rich diet.  - he acknowledges that there is a room for improvement in his food and drink choices. - Suggestion is made for him to avoid simple carbohydrates  from his diet including Cakes, Sweet Desserts, Ice Cream, Soda (diet and regular), Sweet Tea, Candies, Chips, Cookies, Store Bought Juices, Alcohol in Excess of  1-2 drinks a day, Artificial Sweeteners,  Coffee Creamer, and "Sugar-free" Products, Lemonade. This will help patient to have more stable blood glucose profile and potentially avoid unintended weight gain.   - I encouraged him to switch to  unprocessed or minimally processed complex starch and increased protein intake (animal or plant source), fruits, and vegetables.  - he is advised to stick to a routine mealtimes to eat 3 meals  a day and avoid unnecessary snacks ( to snack only to correct hypoglycemia).    - I have approached him with the following  individualized plan to manage diabetes and patient agrees:  -Without documented evidence of insulin administration, it is difficult to adjust and optimize his insulin treatment in light of the fact that he is on extremely high dose of  insulin.    He is encouraged to stay on his current dose of insulin Humulin R U500 160 units 3 times a day with breakfast, lunch, and supper associated with strict monitoring of blood glucose at least 4 times a day before meals and at bedtime.    -He is currently using Dexcom CGM, advised to use it at all times.  -He is warned not to take insulin  without proper monitoring per orders. -Patient is encouraged to call clinic for blood glucose levels less than 70 or above 300 mg /dl. -He is advised to continue Metformin 500 mg p.o. twice daily---after breakfast and supper.   - He is advised to continue glipizide 10 mg XL daily at breakfast.     -He will greatly benefit from weight loss surgery-see below.    - Patient specific target  A1c;  LDL, HDL, Triglycerides were discussed in detail.  2) BP/HTN:  -His blood pressure is near target.  He is advised to continue blood pressure medications including Hyzaar.   3) Lipids/HPL: His recent lipid panel showed significantly improved lipid panel triglycerides down to 271.   -  He is approached with the fact that better  control of diabetes will help control triglycerides.  He is advised to continue pravastatin 40 mg p.o. nightly as well as fenofibrate 140 mg p.o. nightly.     4)  Weight/Diet: His BMI 39.38-clearly complicating his diabetes care.  He has started the process of bariatric surgery-currently being cleared by different specialties for sleeve gastrectomy. He will greatly benefit from this procedure, and is encouraged to continue to pursue.    In the meantime, CDE Consult  in progress , exercise as tolerated, and detailed carbohydrates information provided.  5) Chronic Care/Health Maintenance:  -he  is on  ACEI/ARB and Statin medications and  is encouraged to continue to follow up with Ophthalmology (he is significantly overdue), Dentist,  Podiatrist at least yearly or according to recommendations, and advised to  stay away from smoking. I have recommended yearly flu vaccine and pneumonia vaccination at least every 5 years; moderate intensity exercise for up to 150 minutes weekly; and  sleep for at least 7 hours a day.  Point-of-care urine microalbumin test is negative. He is being treated for peripheral arthralgias.  His ABI today is favorable. POCT ABI Results 09/13/20   Right ABI: 1.06      left ABI: 1.05  Right leg systolic / diastolic: 151/89 mmHg Left leg systolic / diastolic: 149/92 mmHg  Arm systolic / diastolic: 142/88 mmHG  He is encouraged to continue follow-up in the vein clinic.  - I advised patient to maintain close follow up with Richarda Blade E for primary care needs.  - Time spent on this patient care encounter:  40 min, of which > 50% was spent in  counseling and the rest reviewing his blood glucose logs , discussing his hypoglycemia and hyperglycemia episodes, reviewing his current and  previous labs / studies  ( including abstraction from other facilities) and medications  doses and developing a  [place] term treatment plan and documenting his care.   Please refer to Patient Instructions for Blood Glucose Monitoring and Insulin/Medications Dosing Guide"  in media tab for additional information. Please  also refer to " Patient Self Inventory" in the Media  tab for reviewed elements of pertinent patient history.  Rinaldo Ratel Nulty participated in the discussions, expressed understanding, and voiced agreement with the above plans.  All questions were answered to his  satisfaction. he is encouraged to contact clinic should he have any questions or concerns prior to his return visit.    Follow up plan: - Return in about 3 months (around 12/12/2020) for F/U with Pre-visit Labs, Meter,  Logs, A1c here.Marquis Lunch, MD Phone: [phone]  Fax: [phone]   09/13/2020, 12:44 PM   This note was partially dictated with voice recognition software. Similar sounding words can be transcribed inadequately or may not  be corrected upon review.

## 2020-09-23 ENCOUNTER — Other Ambulatory Visit: Payer: Self-pay | Admitting: "Endocrinology

## 2020-10-10 ENCOUNTER — Ambulatory Visit: Payer: Medicare Other | Admitting: Skilled Nursing Facility1

## 2020-10-16 ENCOUNTER — Encounter: Payer: Medicare Other | Admitting: Skilled Nursing Facility1

## 2020-10-16 ENCOUNTER — Encounter: Payer: Medicare Other | Attending: General Surgery | Admitting: Skilled Nursing Facility1

## 2020-10-16 ENCOUNTER — Other Ambulatory Visit: Payer: Self-pay

## 2020-10-16 DIAGNOSIS — E669 Obesity, unspecified: Secondary | ICD-10-CM | POA: Diagnosis present

## 2020-10-16 DIAGNOSIS — E1165 Type 2 diabetes mellitus with hyperglycemia: Secondary | ICD-10-CM | POA: Diagnosis present

## 2020-10-16 NOTE — Progress Notes (Signed)
Supervised Weight Loss Visit Bariatric Nutrition Education  Planned Surgery: sleeve Pt Expectation of Surgery/ Goals: to control diabetes  4 out of 6 SWL Appointments   NUTRITION ASSESSMENT  Appt conducted via telephone due to pt being currently hospitalized pt identified via DOB and name and verbally consented to the limitations of this visit type  Anthropometrics  Start weight at [facility]: 240.5 lbs (date: 07/18/2020) Today's weight: 236.9 lbs Weight change: telephone visit BMI:  kg/m2    Clinical  Medical Hx: Uncontrolled diabetes, neuropathy Medications: meal time insulin; dexcom continuous monitor  Labs:  Notable Signs/Symptoms:   Lifestyle & Dietary Hx  Pt is currently admitted to the hospital due to Covid19 complications stating he is still in the emergency room admitted stating there is no room. Pt states he had DKA in the hospital as well due to not eating and not taking his insulin. Pt states he has been in the hospital since Friday.   Estimated daily fluid intake: 64+ oz Supplements:  Current average weekly physical activity: ADl's  24-Hr Dietary Recall First Meal: apple or grapes + protein shake Snack:  Second Meal: ham sandwiches + mayonnaise or fast food (slugged this once) Snack: 15 grapes Third Meal: frozen meals Snack:  Beverages: protein shake, plain water  Estimated Energy Needs Calories: 1500    NUTRITION DIAGNOSIS  Overweight/obesity ([place]-3.3) related to past poor dietary habits and physical inactivity as evidenced by patient w/ planned sleeve gastrectomy surgery following dietary guidelines for continued weight loss.   NUTRITION INTERVENTION  Nutrition counseling (C-1) and education (E-2) to facilitate bariatric surgery goals.  Pre-Op Goals Progress & New Goals . Take your insulin and follow all instructions the hopsital gives you once you are disharged . Call [facility] when you are discharged to make your next SWL appt needing to be within 30 days of  todays appt  Handouts Provided Include     Learning Style & Readiness for Change Teaching method utilized: Visual & Auditory  Demonstrated degree of understanding via: Teach Back  Barriers to learning/adherence to lifestyle change: none identified   RD's Notes for next Visit  . Assess pts adherence to chosen goals   MONITORING & EVALUATION Dietary intake, weekly physical activity, body weight, and pre-op goals in 1 month.   Next Steps  Patient is to return to [facility] in 1 month

## 2020-10-18 ENCOUNTER — Other Ambulatory Visit: Payer: Self-pay | Admitting: "Endocrinology

## 2020-10-19 ENCOUNTER — Telehealth: Payer: Self-pay | Admitting: "Endocrinology

## 2020-10-19 NOTE — Telephone Encounter (Signed)
Russell Clark called from [facility] and left a voicemail in regards to patient. She is requesting a call back [phone]/ I tried called her back, did not get a answer

## 2020-10-19 NOTE — Telephone Encounter (Signed)
Called Russell Clark with [facility], she requested records from pt's last two visits. Information faxed to [facility].

## 2020-11-15 ENCOUNTER — Other Ambulatory Visit: Payer: Self-pay | Admitting: "Endocrinology

## 2020-12-07 LAB — COMPREHENSIVE METABOLIC PANEL
ALT: 34 IU/L (ref 0–44)
AST: 27 IU/L (ref 0–40)
Albumin/Globulin Ratio: 2.1 (ref 1.2–2.2)
Albumin: 3.9 g/dL (ref 3.8–4.9)
Alkaline Phosphatase: 137 IU/L — ABNORMAL HIGH (ref 44–121)
BUN/Creatinine Ratio: 20 (ref 9–20)
BUN: 25 mg/dL — ABNORMAL HIGH (ref 6–24)
Bilirubin Total: 0.4 mg/dL (ref 0.0–1.2)
CO2: 21 mmol/L (ref 20–29)
Calcium: 8.8 mg/dL (ref 8.7–10.2)
Chloride: 97 mmol/L (ref 96–106)
Creatinine, Ser: 1.27 mg/dL (ref 0.76–1.27)
Globulin, Total: 1.9 g/dL (ref 1.5–4.5)
Glucose: 352 mg/dL — ABNORMAL HIGH (ref 65–99)
Potassium: 4.2 mmol/L (ref 3.5–5.2)
Sodium: 136 mmol/L (ref 134–144)
Total Protein: 5.8 g/dL — ABNORMAL LOW (ref 6.0–8.5)
eGFR: 68 mL/min/{1.73_m2} (ref >59)

## 2020-12-07 LAB — LIPID PANEL
Chol/HDL Ratio: 4 ratio (ref 0.0–5.0)
Cholesterol, Total: 85 mg/dL — ABNORMAL LOW (ref 100–199)
HDL: 21 mg/dL — ABNORMAL LOW (ref >39)
LDL Chol Calc (NIH): 18 mg/dL (ref 0–99)
Triglycerides: 324 mg/dL — ABNORMAL HIGH (ref 0–149)
VLDL Cholesterol Cal: 46 mg/dL — ABNORMAL HIGH (ref 5–40)

## 2020-12-07 LAB — TSH: TSH: 2.24 u[IU]/mL (ref 0.450–4.500)

## 2020-12-07 LAB — T4, FREE: Free T4: 1.25 ng/dL (ref 0.82–1.77)

## 2020-12-13 ENCOUNTER — Encounter: Payer: Self-pay | Admitting: "Endocrinology

## 2020-12-13 ENCOUNTER — Other Ambulatory Visit: Payer: Self-pay

## 2020-12-13 ENCOUNTER — Ambulatory Visit (INDEPENDENT_AMBULATORY_CARE_PROVIDER_SITE_OTHER): Payer: Medicare Other | Admitting: "Endocrinology

## 2020-12-13 VITALS — BP 140/76 | HR 84 | Ht 66.0 in | Wt 243.6 lb

## 2020-12-13 DIAGNOSIS — I10 Essential (primary) hypertension: Secondary | ICD-10-CM | POA: Diagnosis not present

## 2020-12-13 DIAGNOSIS — E782 Mixed hyperlipidemia: Secondary | ICD-10-CM

## 2020-12-13 DIAGNOSIS — E1165 Type 2 diabetes mellitus with hyperglycemia: Secondary | ICD-10-CM | POA: Diagnosis not present

## 2020-12-13 DIAGNOSIS — Z6839 Body mass index (BMI) 39.0-39.9, adult: Secondary | ICD-10-CM

## 2020-12-13 NOTE — Patient Instructions (Signed)

## 2020-12-13 NOTE — Progress Notes (Signed)
12/13/2020   Endocrinology follow-up note    Subjective:    Patient ID: Russell Clark, male    DOB: 10/16/1965.  he is being seen in follow-up for management of currently uncontrolled symptomatic type 2 diabetes, hyperlipidemia, hypertension. PCP:  Thea Alken   Past Medical History:  Diagnosis Date  . Diabetes mellitus, type II ([place])   . Hyperlipidemia   . Hypertension    Past Surgical History:  Procedure Laterality Date  . OTHER SURGICAL HISTORY     L foot, Cataracts   Social History   Socioeconomic History  . Marital status: Divorced    Spouse name: Not on file  . Number of children: Not on file  . Years of education: Not on file  . Highest education level: Not on file  Occupational History  . Not on file  Tobacco Use  . Smoking status: Former Smoker    Packs/day: 1.00    Years: 25.00    Pack years: 25.00    Types: Cigarettes  . Smokeless tobacco: Never Used  Vaping Use  . Vaping Use: Never used  Substance and Sexual Activity  . Alcohol use: No  . Drug use: No  . Sexual activity: Not on file  Other Topics Concern  . Not on file  Social History Narrative  . Not on file   Social Determinants of Health   Financial Resource Strain: Not on file  Food Insecurity: Not on file  Transportation Needs: Not on file  Physical Activity: Not on file  Stress: Not on file  Social Connections: Not on file   Outpatient Encounter Medications as of 12/13/2020  Medication Sig  . buPROPion (WELLBUTRIN SR) 150 MG 12 hr tablet Take 150 mg by mouth 2 (two) times daily.  . Cholecalciferol (VITAMIN D PO) Take by mouth.  . cilostazol (PLETAL) 50 MG tablet 2 (two) times daily.  . clopidogrel (PLAVIX) 75 MG tablet Take 75 mg by mouth daily.  . Continuous Blood Gluc Receiver (DEXCOM G6 RECEIVER) DEVI 1 Piece by Does not apply route as needed.  . Continuous Blood Gluc Sensor (DEXCOM G6 SENSOR) MISC 4 Pieces by Does not apply route once a week.  . Continuous Blood Gluc  Transmit (DEXCOM G6 TRANSMITTER) MISC 1 Piece by Does not apply route as directed.  . furosemide (LASIX) 20 MG tablet Take 20 mg by mouth every morning.  Marland Kitchen glipiZIDE (GLUCOTROL XL) 5 MG 24 hr tablet TAKE 2 TABLETS BY MOUTH ONCE DAILY WITH BREAKFAST  . HUMULIN R U-500 KWIKPEN 500 UNIT/ML kwikpen INJECT 160 UNITS SUBCUTANEOUSLY 3 TIMES DAILY WITH MEAL(S)  . Insulin Pen Needle (B-D ULTRAFINE III SHORT PEN) 31G X 8 MM MISC 1 each by Does not apply route as directed.  . Lidocaine 4 % LOTN Apply topically.  Marland Kitchen loperamide (IMODIUM A-D) 2 MG tablet Take 2 mg by mouth daily.   Marland Kitchen losartan-hydrochlorothiazide (HYZAAR) 100-12.5 MG tablet Take 1 tablet by mouth daily.  . metFORMIN (GLUCOPHAGE) 500 MG tablet TAKE 1 TABLET BY MOUTH TWICE DAILY WITH MEALS  . nortriptyline (PAMELOR) 25 MG capsule Take 25 mg by mouth as needed.   Marland Kitchen omeprazole (PRILOSEC) 40 MG capsule Take 40 mg by mouth daily.  . pregabalin (LYRICA) 300 MG capsule Take 300 mg by mouth 2 (two) times daily.   Marland Kitchen pyridOXINE (VITAMIN B-6) 100 MG tablet Take 100 mg by mouth daily.  . rosuvastatin (CRESTOR) 20 MG tablet Take 20 mg by mouth at bedtime.  Marland Kitchen VITAMIN D,  ERGOCALCIFEROL, PO Take by mouth.  . [DISCONTINUED] pravastatin (PRAVACHOL) 40 MG tablet Take 1 tablet (40 mg total) by mouth daily. (Patient taking differently: Take 80 mg by mouth daily. )   No facility-administered encounter medications on file as of 12/13/2020.    ALLERGIES: Allergies  Allergen Reactions  . Aspirin   . Darvon [Propoxyphene]   . Ibuprofen   . Penicillins     VACCINATION STATUS:  There is no immunization history on file for this patient.  Diabetes He presents for his follow-up diabetic visit. He has type 2 diabetes mellitus. Onset time: He was diagnosed at approximate age of 87 years. His disease course has been worsening. There are no hypoglycemic associated symptoms. Pertinent negatives for hypoglycemia include no confusion, headaches, pallor or seizures.  Associated symptoms include polydipsia and polyuria. Pertinent negatives for diabetes include no blurred vision, no chest pain, no fatigue, no polyphagia and no weakness. There are no hypoglycemic complications. Symptoms are worsening. Diabetic complications include peripheral neuropathy. Risk factors for coronary artery disease include dyslipidemia, diabetes mellitus, hypertension, male sex, sedentary lifestyle and tobacco exposure. Current diabetic treatment includes insulin injections and oral agent (dual therapy). He is compliant with treatment most of the time. His weight is fluctuating minimally (Here in the process of getting clearance for sleeve gastrectomy by different medical specialties.). He is following a generally unhealthy diet. When asked about meal planning, he reported none. He never participates in exercise. His home blood glucose trend is increasing steadily. His breakfast blood glucose range is generally >200 mg/dl. His lunch blood glucose range is generally >200 mg/dl. His dinner blood glucose range is generally >200 mg/dl. His bedtime blood glucose range is generally >200 mg/dl. His overall blood glucose range is >200 mg/dl. (He did not bring his insulin administration records.  His CGM analysis shows 29% time range, 69% above range.  No major hypoglycemia.  His average blood glucose is 241.  A1c was 11.8% in January 2022.  In the interval, he developed COVID-19 positive test (U07.1, COVID-19) with Acute Pneumonia (J12.89, Other viral pneumonia) (If respiratory failure or sepsis present, add as separate assessment)  Complicated by diabetes ketoacidosis. ) An ACE inhibitor/angiotensin II receptor blocker is being taken. He sees a podiatrist.Eye exam is current (He is due for his dilated eye exam, promises to do before next visit.).  Hyperlipidemia This is a chronic problem. The current episode started more than 1 year ago. The problem is uncontrolled. Recent lipid tests were reviewed and  are high. Exacerbating diseases include diabetes and obesity. Factors aggravating his hyperlipidemia include smoking. Pertinent negatives include no chest pain, myalgias or shortness of breath. Current antihyperlipidemic treatment includes statins. Risk factors for coronary artery disease include dyslipidemia, diabetes mellitus, a sedentary lifestyle and obesity.  Hypertension This is a chronic problem. The current episode started more than 1 year ago. The problem is controlled. Pertinent negatives include no blurred vision, chest pain, headaches, neck pain, palpitations or shortness of breath. Risk factors for coronary artery disease include dyslipidemia, diabetes mellitus, male gender, sedentary lifestyle and smoking/tobacco exposure. Past treatments include angiotensin blockers.    Review of systems     Objective:    BP 140/76   Pulse 84   Ht _0  (1.676 m)   Wt 243 lb 9.6 oz (110.5 kg)   BMI 39.32 kg/m   Wt Readings from Last 3 Encounters:  12/13/20 243 lb 9.6 oz (110.5 kg)  09/13/20 240 lb 3.2 oz (109 kg)  09/12/20 236 lb  14.4 oz (107.5 kg)     Physical Exam- Limited    Recent Results (from the past 2160 hour(s))  Comprehensive metabolic panel     Status: Abnormal   Collection Time: 12/06/20  8:31 AM  Result Value Ref Range   Glucose 352 (H) 65 - 99 mg/dL   BUN 25 (H) 6 - 24 mg/dL   Creatinine, Ser 1.27 0.76 - 1.27 mg/dL   eGFR 68 >59 mL/min/1.73   BUN/Creatinine Ratio 20 9 - 20   Sodium 136 134 - 144 mmol/L   Potassium 4.2 3.5 - 5.2 mmol/L   Chloride 97 96 - 106 mmol/L   CO2 21 20 - 29 mmol/L   Calcium 8.8 8.7 - 10.2 mg/dL   Total Protein 5.8 (L) 6.0 - 8.5 g/dL   Albumin 3.9 3.8 - 4.9 g/dL   Globulin, Total 1.9 1.5 - 4.5 g/dL   Albumin/Globulin Ratio 2.1 1.2 - 2.2   Bilirubin Total 0.4 0.0 - 1.2 mg/dL   Alkaline Phosphatase 137 (H) 44 - 121 IU/L   AST 27 0 - 40 IU/L   ALT 34 0 - 44 IU/L  Lipid panel     Status: Abnormal   Collection Time: 12/06/20  8:31 AM   Result Value Ref Range   Cholesterol, Total 85 (L) 100 - 199 mg/dL   Triglycerides 324 (H) 0 - 149 mg/dL   HDL 21 (L) >39 mg/dL   VLDL Cholesterol Cal 46 (H) 5 - 40 mg/dL   LDL Chol Calc (NIH) 18 0 - 99 mg/dL   Chol/HDL Ratio 4.0 0.0 - 5.0 ratio    Comment:                                   T. Chol/HDL Ratio                                             Men  Women                               1/2 Avg.Risk  3.4    3.3                                   Avg.Risk  5.0    4.4                                2X Avg.Risk  9.6    7.1                                3X Avg.Risk 23.4   11.0   TSH     Status: None   Collection Time: 12/06/20  8:31 AM  Result Value Ref Range   TSH 2.240 0.450 - 4.500 uIU/mL  T4, free     Status: None   Collection Time: 12/06/20  8:31 AM  Result Value Ref Range   Free T4 1.25 0.82 - 1.77 ng/dL   CMP Latest Ref Rng & Units 12/06/2020 05/18/2020 09/19/2019  Glucose 65 - 99 mg/dL 352(H) - -  BUN 6 - 24 mg/dL 25(H) 24(A) 18  Creatinine 0.76 - 1.27 mg/dL 1.27 1.3 1.4(A)  Sodium 134 - 144 mmol/L 136 - -  Potassium 3.5 - 5.2 mmol/L 4.2 4.6 -  Chloride 96 - 106 mmol/L 97 - -  CO2 20 - 29 mmol/L 21 - -  Calcium 8.7 - 10.2 mg/dL 8.8 8.6(A) -  Total Protein 6.0 - 8.5 g/dL 5.8(L) - -  Total Bilirubin 0.0 - 1.2 mg/dL 0.4 - -  Alkaline Phos 44 - 121 IU/L 137(H) - -  AST 0 - 40 IU/L 27 - -  ALT 0 - 44 IU/L 34 - -   Lipid Panel     Component Value Date/Time   CHOL 85 (L) 12/06/2020 0831   TRIG 324 (H) 12/06/2020 0831   HDL 21 (L) 12/06/2020 0831   CHOLHDL 4.0 12/06/2020 0831   LDLCALC 18 12/06/2020 0831   LABVLDL 46 (H) 12/06/2020 0831     Assessment & Plan:   1. Uncontrolled type 2 diabetes mellitus with other specified complication, with Depree-term current use of insulin ([place])  - Patient has currently uncontrolled symptomatic type 2 DM since  55 years of age. He did not bring his insulin administration records.  His CGM analysis shows 29% time range, 69% above  range.  No major hypoglycemia.  His average blood glucose is 241.  A1c was 11.8% in January 2022.  In the interval, he developed LHTDS-28 pneumonia complicated by diabetes ketoacidosis.     -Recent labs reviewed, showing normal renal function,.  -his diabetes is complicated by peripheral neuropathy, peripheral arterial disease,  sedentary life and Daiki E Montminy remains at extremely  high risk for more acute and chronic complications which include CAD, CVA, CKD, retinopathy, and neuropathy. These are all discussed in detail with the patient.  - I have counseled him on diet management and weight loss, by adopting a carbohydrate restricted/protein rich diet.  - he acknowledges that there is a room for improvement in his food and drink choices. - Suggestion is made for him to avoid simple carbohydrates  from his diet including Cakes, Sweet Desserts, Ice Cream, Soda (diet and regular), Sweet Tea, Candies, Chips, Cookies, Store Bought Juices, Alcohol in Excess of  1-2 drinks a day, Artificial Sweeteners,  Coffee Creamer, and "Sugar-free" Products, Lemonade. This will help patient to have more stable blood glucose profile and potentially avoid unintended weight gain.   - I encouraged him to switch to  unprocessed or minimally processed complex starch and increased protein intake (animal or plant source), fruits, and vegetables.  - he is advised to stick to a routine mealtimes to eat 3 meals  a day and avoid unnecessary snacks ( to snack only to correct hypoglycemia).    - I have approached him with the following individualized plan to manage diabetes and patient agrees:  -Without documented evidence of insulin administration , it is difficult to adjust and optimize his insulin treatment in light of the fact that he is on extremely high dose of insulin.    He is encouraged to stay on his current insulin regimen utilizing  Humulin R U500 160 units 3 times a day with breakfast, lunch, and supper  associated with strict monitoring of blood glucose at least 4 times a day before meals and at bedtime.    -He is currently using Dexcom CGM, advised to use it at all times.  -He is warned not to take insulin  without proper monitoring per orders. -Patient  is encouraged to call clinic for blood glucose levels less than 70 or above 300 mg /dl. -He is advised to continue Metformin 500 mg p.o. twice daily---after breakfast and supper.   - He is advised to continue glipizide 10 mg XL daily at breakfast.     -He had postponed the process of bariatric surgery due to his recent hospitalization for Covid pneumonia .  He is awaiting for call from his bariatric team, he will greatly benefit from weight loss surgery-see below.    - Patient specific target  A1c;  LDL, HDL, Triglycerides were discussed in detail.  2) BP/HTN:  -His blood pressure is controlled to target.  He is advised to continue blood pressure medications including Hyzaar.   3) Lipids/HPL: His recent lipid panel showed significantly improved lipid panel triglycerides down to 271.   -  He is approached with the fact that better  control of diabetes will help control triglycerides.  He is advised to continue pravastatin 40 mg p.o. nightly as well as fenofibrate 140 mg p.o. nightly.     4)  Weight/Diet: His BMI 41.93-XTKWIOX complicating his diabetes care.  He has started the process of bariatric surgery-currently being cleared by different specialties for sleeve gastrectomy. He will greatly benefit from this procedure, and is encouraged to continue to pursue.    In the meantime, CDE Consult  in progress , exercise as tolerated, and detailed carbohydrates information provided.  5) Chronic Care/Health Maintenance:  -he  is on ACEI/ARB and Statin medications and  is encouraged to continue to follow up with Ophthalmology (he is significantly overdue), Dentist,  Podiatrist at least yearly or according to recommendations, and advised to   stay away from smoking. I have recommended yearly flu vaccine and pneumonia vaccination at least every 5 years; moderate intensity exercise for up to 150 minutes weekly; and  sleep for at least 7 hours a day.  Point-of-care urine microalbumin test is negative.  He is encouraged to continue follow-up in the vein clinic.  - I advised patient to maintain close follow up with Ephriam Jenkins E for primary care needs.  - Time spent on this patient care encounter:  40 min, of which > 50% was spent in  counseling and the rest reviewing his blood glucose logs , discussing his hypoglycemia and hyperglycemia episodes, reviewing his current and  previous labs / studies  ( including abstraction from other facilities) and medications  doses and developing a  [place] term treatment plan and documenting his care.   Please refer to Patient Instructions for Blood Glucose Monitoring and Insulin/Medications Dosing Guide"  in media tab for additional information. Please  also refer to " Patient Self Inventory" in the Media  tab for reviewed elements of pertinent patient history.  Carolin Sicks Rorrer participated in the discussions, expressed understanding, and voiced agreement with the above plans.  All questions were answered to his satisfaction. he is encouraged to contact clinic should he have any questions or concerns prior to his return visit.   Follow up plan: - Return in about 9 weeks (around 02/14/2021) for Bring Meter and Logs- A1c in Office.  Glade Lloyd, MD Phone: [phone]  Fax: [phone]   12/13/2020, 12:29 PM   This note was partially dictated with voice recognition software. Similar sounding words can be transcribed inadequately or may not  be corrected upon review.

## 2021-01-12 ENCOUNTER — Other Ambulatory Visit: Payer: Self-pay | Admitting: "Endocrinology

## 2021-02-09 ENCOUNTER — Other Ambulatory Visit: Payer: Self-pay | Admitting: "Endocrinology

## 2021-02-14 ENCOUNTER — Encounter: Payer: Self-pay | Admitting: "Endocrinology

## 2021-02-14 ENCOUNTER — Other Ambulatory Visit: Payer: Self-pay

## 2021-02-14 ENCOUNTER — Ambulatory Visit (INDEPENDENT_AMBULATORY_CARE_PROVIDER_SITE_OTHER): Payer: Medicaid - Out of State | Admitting: "Endocrinology

## 2021-02-14 VITALS — BP 120/72 | HR 108 | Ht 66.0 in | Wt 245.8 lb

## 2021-02-14 DIAGNOSIS — E1165 Type 2 diabetes mellitus with hyperglycemia: Secondary | ICD-10-CM | POA: Diagnosis not present

## 2021-02-14 DIAGNOSIS — E6609 Other obesity due to excess calories: Secondary | ICD-10-CM | POA: Diagnosis not present

## 2021-02-14 DIAGNOSIS — E782 Mixed hyperlipidemia: Secondary | ICD-10-CM

## 2021-02-14 DIAGNOSIS — I10 Essential (primary) hypertension: Secondary | ICD-10-CM | POA: Diagnosis not present

## 2021-02-14 DIAGNOSIS — Z6832 Body mass index (BMI) 32.0-32.9, adult: Secondary | ICD-10-CM

## 2021-02-14 LAB — POCT GLYCOSYLATED HEMOGLOBIN (HGB A1C): HbA1c, POC (controlled diabetic range): 9.2 % — AB (ref 0.0–7.0)

## 2021-02-14 MED ORDER — TRULICITY 0.75 MG/0.5ML ~~LOC~~ SOAJ: 0.7500 mg | SUBCUTANEOUS | 2 refills | Status: DC

## 2021-02-14 MED ORDER — HUMULIN R U-500 KWIKPEN 500 UNIT/ML ~~LOC~~ SOPN: 140.0000 [IU] | PEN_INJECTOR | Freq: Three times a day (TID) | SUBCUTANEOUS | 3 refills | Status: DC

## 2021-02-14 NOTE — Progress Notes (Signed)
02/14/2021   Endocrinology follow-up note    Subjective:    Patient ID: Russell Clark, male    DOB: Jun 07, 1966.  he is being seen in follow-up for management of currently uncontrolled symptomatic type 2 diabetes, hyperlipidemia, hypertension. PCP:  Thea Alken   Past Medical History:  Diagnosis Date  . Diabetes mellitus, type II ([place])   . Hyperlipidemia   . Hypertension    Past Surgical History:  Procedure Laterality Date  . OTHER SURGICAL HISTORY     L foot, Cataracts   Social History   Socioeconomic History  . Marital status: Divorced    Spouse name: Not on file  . Number of children: Not on file  . Years of education: Not on file  . Highest education level: Not on file  Occupational History  . Not on file  Tobacco Use  . Smoking status: Former Smoker    Packs/day: 1.00    Years: 25.00    Pack years: 25.00    Types: Cigarettes  . Smokeless tobacco: Never Used  Vaping Use  . Vaping Use: Never used  Substance and Sexual Activity  . Alcohol use: No  . Drug use: No  . Sexual activity: Not on file  Other Topics Concern  . Not on file  Social History Narrative  . Not on file   Social Determinants of Health   Financial Resource Strain: Not on file  Food Insecurity: Not on file  Transportation Needs: Not on file  Physical Activity: Not on file  Stress: Not on file  Social Connections: Not on file   Outpatient Encounter Medications as of 02/14/2021  Medication Sig  . Dulaglutide (TRULICITY) 4.69 GE/9.5MW SOPN Inject 0.75 mg into the skin once a week.  Marland Kitchen buPROPion (WELLBUTRIN SR) 150 MG 12 hr tablet Take 150 mg by mouth 2 (two) times daily.  . Cholecalciferol (VITAMIN D PO) Take by mouth.  . cilostazol (PLETAL) 50 MG tablet 2 (two) times daily.  . clopidogrel (PLAVIX) 75 MG tablet Take 75 mg by mouth daily.  . Continuous Blood Gluc Receiver (DEXCOM G6 RECEIVER) DEVI 1 Piece by Does not apply route as needed.  . Continuous Blood Gluc Sensor (DEXCOM  G6 SENSOR) MISC 4 Pieces by Does not apply route once a week.  . Continuous Blood Gluc Transmit (DEXCOM G6 TRANSMITTER) MISC 1 Piece by Does not apply route as directed.  . furosemide (LASIX) 20 MG tablet Take 20 mg by mouth every morning.  Marland Kitchen glipiZIDE (GLUCOTROL XL) 5 MG 24 hr tablet TAKE 2 TABLETS BY MOUTH ONCE DAILY WITH BREAKFAST  . Insulin Pen Needle (B-D ULTRAFINE III SHORT PEN) 31G X 8 MM MISC Use 1 pen needle tid  . insulin regular human CONCENTRATED (HUMULIN R U-500 KWIKPEN) 500 UNIT/ML kwikpen Inject 140-160 Units into the skin 3 (three) times daily with meals.  . Lidocaine 4 % LOTN Apply topically.  Marland Kitchen loperamide (IMODIUM A-D) 2 MG tablet Take 2 mg by mouth daily.   Marland Kitchen losartan-hydrochlorothiazide (HYZAAR) 100-12.5 MG tablet Take 1 tablet by mouth daily.  . metFORMIN (GLUCOPHAGE) 500 MG tablet TAKE 1 TABLET BY MOUTH TWICE DAILY WITH MEALS  . nortriptyline (PAMELOR) 25 MG capsule Take 25 mg by mouth as needed.   Marland Kitchen omeprazole (PRILOSEC) 40 MG capsule Take 40 mg by mouth daily.  . pregabalin (LYRICA) 300 MG capsule Take 300 mg by mouth 2 (two) times daily.   Marland Kitchen pyridOXINE (VITAMIN B-6) 100 MG tablet Take 100 mg by mouth  daily.  . rosuvastatin (CRESTOR) 20 MG tablet Take 20 mg by mouth at bedtime.  Marland Kitchen VITAMIN D, ERGOCALCIFEROL, PO Take by mouth.  . [DISCONTINUED] HUMULIN R U-500 KWIKPEN 500 UNIT/ML kwikpen INJECT 160 UNITS SUBCUTANEOUSLY 3 TIMES DAILY WITH MEAL(S)   No facility-administered encounter medications on file as of 02/14/2021.    ALLERGIES: Allergies  Allergen Reactions  . Aspirin   . Darvon [Propoxyphene]   . Ibuprofen   . Penicillins     VACCINATION STATUS:  There is no immunization history on file for this patient.  Diabetes He presents for his follow-up diabetic visit. He has type 2 diabetes mellitus. Onset time: He was diagnosed at approximate age of 89 years. His disease course has been improving. There are no hypoglycemic associated symptoms. Pertinent negatives  for hypoglycemia include no confusion, headaches, pallor or seizures. Associated symptoms include polydipsia and polyuria. Pertinent negatives for diabetes include no blurred vision, no chest pain, no fatigue, no polyphagia and no weakness. There are no hypoglycemic complications. Symptoms are improving. Diabetic complications include peripheral neuropathy. Risk factors for coronary artery disease include dyslipidemia, diabetes mellitus, hypertension, male sex, sedentary lifestyle, tobacco exposure and obesity. Current diabetic treatment includes insulin injections and oral agent (dual therapy). He is compliant with treatment most of the time. His weight is increasing steadily (Here in the process of getting clearance for sleeve gastrectomy by different medical specialties.). He is following a generally unhealthy diet. When asked about meal planning, he reported none. He never participates in exercise. His home blood glucose trend is decreasing steadily. His breakfast blood glucose range is generally >200 mg/dl. His lunch blood glucose range is generally >200 mg/dl. His dinner blood glucose range is generally >200 mg/dl. His bedtime blood glucose range is generally >200 mg/dl. His overall blood glucose range is >200 mg/dl. (He did bring his insulin administration records.  His CGM analysis shows average blood glucose of 261, 20% time in range, 68% above range, rare random hypoglycemia.  His point-of-care A1c is 9.2% improving from 11.8%.  ) An ACE inhibitor/angiotensin II receptor blocker is being taken. He sees a podiatrist.Eye exam is current (He is due for his dilated eye exam, promises to do before next visit.).  Hyperlipidemia This is a chronic problem. The current episode started more than 1 year ago. The problem is uncontrolled. Recent lipid tests were reviewed and are high. Exacerbating diseases include diabetes and obesity. Factors aggravating his hyperlipidemia include smoking. Pertinent negatives  include no chest pain, myalgias or shortness of breath. Current antihyperlipidemic treatment includes statins. Risk factors for coronary artery disease include dyslipidemia, diabetes mellitus, a sedentary lifestyle and obesity.  Hypertension This is a chronic problem. The current episode started more than 1 year ago. The problem is controlled. Pertinent negatives include no blurred vision, chest pain, headaches, neck pain, palpitations or shortness of breath. Risk factors for coronary artery disease include dyslipidemia, diabetes mellitus, male gender, sedentary lifestyle and smoking/tobacco exposure. Past treatments include angiotensin blockers.    Review of systems    Objective:    BP 120/72   Pulse (!) 108   Ht 5' 6"  (1.676 m)   Wt 245 lb 12.8 oz (111.5 kg)   BMI 39.67 kg/m   Wt Readings from Last 3 Encounters:  02/14/21 245 lb 12.8 oz (111.5 kg)  12/13/20 243 lb 9.6 oz (110.5 kg)  09/13/20 240 lb 3.2 oz (109 kg)     Physical Exam- Limited    Recent Results (from the past 2160 hour(s))  Comprehensive metabolic panel     Status: Abnormal   Collection Time: 12/06/20  8:31 AM  Result Value Ref Range   Glucose 352 (H) 65 - 99 mg/dL   BUN 25 (H) 6 - 24 mg/dL   Creatinine, Ser 1.27 0.76 - 1.27 mg/dL   eGFR 68 >59 mL/min/1.73   BUN/Creatinine Ratio 20 9 - 20   Sodium 136 134 - 144 mmol/L   Potassium 4.2 3.5 - 5.2 mmol/L   Chloride 97 96 - 106 mmol/L   CO2 21 20 - 29 mmol/L   Calcium 8.8 8.7 - 10.2 mg/dL   Total Protein 5.8 (L) 6.0 - 8.5 g/dL   Albumin 3.9 3.8 - 4.9 g/dL   Globulin, Total 1.9 1.5 - 4.5 g/dL   Albumin/Globulin Ratio 2.1 1.2 - 2.2   Bilirubin Total 0.4 0.0 - 1.2 mg/dL   Alkaline Phosphatase 137 (H) 44 - 121 IU/L   AST 27 0 - 40 IU/L   ALT 34 0 - 44 IU/L  Lipid panel     Status: Abnormal   Collection Time: 12/06/20  8:31 AM  Result Value Ref Range   Cholesterol, Total 85 (L) 100 - 199 mg/dL   Triglycerides 324 (H) 0 - 149 mg/dL   HDL 21 (L) >39 mg/dL    VLDL Cholesterol Cal 46 (H) 5 - 40 mg/dL   LDL Chol Calc (NIH) 18 0 - 99 mg/dL   Chol/HDL Ratio 4.0 0.0 - 5.0 ratio    Comment:                                   T. Chol/HDL Ratio                                             Men  Women                               1/2 Avg.Risk  3.4    3.3                                   Avg.Risk  5.0    4.4                                2X Avg.Risk  9.6    7.1                                3X Avg.Risk 23.4   11.0   TSH     Status: None   Collection Time: 12/06/20  8:31 AM  Result Value Ref Range   TSH 2.240 0.450 - 4.500 uIU/mL  T4, free     Status: None   Collection Time: 12/06/20  8:31 AM  Result Value Ref Range   Free T4 1.25 0.82 - 1.77 ng/dL  HgB A1c     Status: Abnormal   Collection Time: 02/14/21  1:10 PM  Result Value Ref Range   Hemoglobin A1C     HbA1c POC (<> result, manual entry)     HbA1c, POC (prediabetic  range)     HbA1c, POC (controlled diabetic range) 9.2 (A) 0.0 - 7.0 %   CMP Latest Ref Rng & Units 12/06/2020 05/18/2020 09/19/2019  Glucose 65 - 99 mg/dL 352(H) - -  BUN 6 - 24 mg/dL 25(H) 24(A) 18  Creatinine 0.76 - 1.27 mg/dL 1.27 1.3 1.4(A)  Sodium 134 - 144 mmol/L 136 - -  Potassium 3.5 - 5.2 mmol/L 4.2 4.6 -  Chloride 96 - 106 mmol/L 97 - -  CO2 20 - 29 mmol/L 21 - -  Calcium 8.7 - 10.2 mg/dL 8.8 8.6(A) -  Total Protein 6.0 - 8.5 g/dL 5.8(L) - -  Total Bilirubin 0.0 - 1.2 mg/dL 0.4 - -  Alkaline Phos 44 - 121 IU/L 137(H) - -  AST 0 - 40 IU/L 27 - -  ALT 0 - 44 IU/L 34 - -   Lipid Panel     Component Value Date/Time   CHOL 85 (L) 12/06/2020 0831   TRIG 324 (H) 12/06/2020 0831   HDL 21 (L) 12/06/2020 0831   CHOLHDL 4.0 12/06/2020 0831   LDLCALC 18 12/06/2020 0831   LABVLDL 46 (H) 12/06/2020 0831     Assessment & Plan:   1. Uncontrolled type 2 diabetes mellitus with other specified complication, with [place]-term current use of insulin ([place])  - Patient has currently uncontrolled symptomatic type 2 DM since  55  years of age.  He did bring his insulin administration records.  His CGM analysis shows average blood glucose of 261, 20% time in range, 68% above range, rare random hypoglycemia.  His point-of-care A1c is 9.2% improving from 11.8%.   -Recent labs reviewed, showing normal renal function,.  -his diabetes is complicated by peripheral neuropathy, peripheral arterial disease,  sedentary life and Marcellous E Merkey remains at extremely  high risk for more acute and chronic complications which include CAD, CVA, CKD, retinopathy, and neuropathy. These are all discussed in detail with the patient.  - I have counseled him on diet management and weight loss, by adopting a carbohydrate restricted/protein rich diet.  - he acknowledges that there is a room for improvement in his food and drink choices. - Suggestion is made for him to avoid simple carbohydrates  from his diet including Cakes, Sweet Desserts, Ice Cream, Soda (diet and regular), Sweet Tea, Candies, Chips, Cookies, Store Bought Juices, Alcohol in Excess of  1-2 drinks a day, Artificial Sweeteners,  Coffee Creamer, and "Sugar-free" Products, Lemonade. This will help patient to have more stable blood glucose profile and potentially avoid unintended weight gain.   - I encouraged him to switch to  unprocessed or minimally processed complex starch and increased protein intake (animal or plant source), fruits, and vegetables.  - he is advised to stick to a routine mealtimes to eat 3 meals  a day and avoid unnecessary snacks ( to snack only to correct hypoglycemia).    - I have approached him with the following individualized plan to manage diabetes and patient agrees:   -He will continue to need intensive treatment with high-dose insulin in order for him to achieve control of diabetes to target. -Due to rare, random overnight hypoglycemia, he is advised to lower his U500 to 140 units at supper, 160 units at breakfast 160 units at lunch for [place] blood  glucose readings above 90 mg per DL. -He is advised not to inject insulin without a meal and without proper monitoring of blood glucose. He is encouraged to use his CGM at all times, especially  before meals and at bedtime.  -He is currently using Dexcom CGM, advised to use it at all times.  -Patient is encouraged to call clinic for blood glucose levels less than 70 or above 300 mg /dl. -He is advised to continue Metformin 500 mg p.o. twice daily---after breakfast and supper.   - He is advised to continue glipizide 10 mg XL daily at breakfast.     -He had postponed the process of bariatric surgery due to his recent hospitalization for Covid pneumonia .  He is awaiting for call from his bariatric team, he will greatly benefit from weight loss surgery-see below. -In light of his recent lipid panel with triglycerides at 324, he is deemed suitable for GLP-1 receptor agonist.  I discussed and prescribed Trulicity 7.62 mg subcutaneously weekly.  Side effects and precautions discussed with him.     - Patient specific target  A1c;  LDL, HDL, Triglycerides were discussed in detail.  2) BP/HTN:  - his blood pressure is controlled target.  He is advised to continue blood pressure medications including Hyzaar.   3) Lipids/HPL: His recent lipid panel showed significantly improved lipid panel with LDL of 18, triglycerides 324.   -He is approached with the fact that better  control of diabetes will help control triglycerides.  He is advised to continue pravastatin 40 mg p.o. nightly as well as fenofibrate 140 mg p.o. nightly.   4)  Weight/Diet: His BMI 83.15-VVOHYWV complicating his diabetes care.  He has started the process of bariatric surgery-currently being cleared by different specialties for sleeve gastrectomy. He will greatly benefit from this procedure, and is encouraged to continue to pursue.    In the meantime, CDE Consult  in progress , exercise as tolerated, and detailed carbohydrates  information provided.  5) Chronic Care/Health Maintenance:  -he  is on ACEI/ARB and Statin medications and  is encouraged to continue to follow up with Ophthalmology (he is significantly overdue), Dentist,  Podiatrist at least yearly or according to recommendations, and advised to  stay away from smoking. I have recommended yearly flu vaccine and pneumonia vaccination at least every 5 years; moderate intensity exercise for up to 150 minutes weekly; and  sleep for at least 7 hours a day.  Point-of-care urine microalbumin test is negative.  He is encouraged to continue follow-up in the vein clinic.  - I advised patient to maintain close follow up with Ephriam Jenkins E for primary care needs.   I spent 42 minutes in the care of the patient today including review of labs from [facility], Lipids, Thyroid Function, Hematology (current and previous including abstractions from other facilities); face-to-face time discussing  his blood glucose readings/logs, discussing hypoglycemia and hyperglycemia episodes and symptoms, medications doses, his options of short and [place] term treatment based on the latest standards of care / guidelines;  discussion about incorporating lifestyle medicine;  and documenting the encounter.    Please refer to Patient Instructions for Blood Glucose Monitoring and Insulin/Medications Dosing Guide"  in media tab for additional information. Please  also refer to " Patient Self Inventory" in the Media  tab for reviewed elements of pertinent patient history.  Carolin Sicks Stafford participated in the discussions, expressed understanding, and voiced agreement with the above plans.  All questions were answered to his satisfaction. he is encouraged to contact clinic should he have any questions or concerns prior to his return visit.   Follow up plan: - Return in about 3 months (around 05/17/2021) for Bring Meter and  Logs- A1c in Office.  Glade Lloyd, MD Phone: [phone]  Fax: [phone]    02/14/2021, 4:43 PM   This note was partially dictated with voice recognition software. Similar sounding words can be transcribed inadequately or may not  be corrected upon review.

## 2021-02-14 NOTE — Patient Instructions (Signed)

## 2021-03-03 ENCOUNTER — Other Ambulatory Visit: Payer: Self-pay | Admitting: "Endocrinology

## 2021-04-07 ENCOUNTER — Other Ambulatory Visit: Payer: Self-pay | Admitting: "Endocrinology

## 2021-05-11 ENCOUNTER — Other Ambulatory Visit: Payer: Self-pay | Admitting: "Endocrinology

## 2021-05-22 ENCOUNTER — Other Ambulatory Visit: Payer: Self-pay

## 2021-05-22 ENCOUNTER — Ambulatory Visit (INDEPENDENT_AMBULATORY_CARE_PROVIDER_SITE_OTHER): Payer: Medicare Other | Admitting: "Endocrinology

## 2021-05-22 ENCOUNTER — Encounter: Payer: Self-pay | Admitting: "Endocrinology

## 2021-05-22 VITALS — Ht 66.0 in | Wt 235.0 lb

## 2021-05-22 DIAGNOSIS — E1165 Type 2 diabetes mellitus with hyperglycemia: Secondary | ICD-10-CM

## 2021-05-22 DIAGNOSIS — I10 Essential (primary) hypertension: Secondary | ICD-10-CM

## 2021-05-22 DIAGNOSIS — E782 Mixed hyperlipidemia: Secondary | ICD-10-CM

## 2021-05-22 LAB — POCT GLYCOSYLATED HEMOGLOBIN (HGB A1C): HbA1c, POC (controlled diabetic range): 8.5 % — AB (ref 0.0–7.0)

## 2021-05-22 MED ORDER — TRULICITY 1.5 MG/0.5ML ~~LOC~~ SOAJ: 1.5000 mg | SUBCUTANEOUS | 2 refills | Status: DC

## 2021-05-22 MED ORDER — GLIPIZIDE ER 5 MG PO TB24: 5.0000 mg | ORAL_TABLET | Freq: Every day | ORAL | 1 refills | Status: DC

## 2021-05-22 MED ORDER — HUMULIN R U-500 KWIKPEN 500 UNIT/ML ~~LOC~~ SOPN: 140.0000 [IU] | PEN_INJECTOR | Freq: Three times a day (TID) | SUBCUTANEOUS | 3 refills | Status: DC

## 2021-05-22 NOTE — Patient Instructions (Signed)
                                     Advice for Weight Management  -For most of us the best way to lose weight is by diet management. Generally speaking, diet management means consuming less calories intentionally which over time brings about progressive weight loss.  This can be achieved more effectively by restricting carbohydrate consumption to the minimum possible.  So, it is critically important to know your numbers: how much calorie you are consuming and how much calorie you need. More importantly, our carbohydrates sources should be unprocessed or minimally processed complex starch food items.   Sometimes, it is important to balance nutrition by increasing protein intake (animal or plant source), fruits, and vegetables.  - Whole Food, Plant Predominant Nutrition is highly recommended: Eat Plenty of vegetables, Mushrooms, fruits, Legumes, Whole Grains, Nuts, seeds in lieu of processed meats, processed snacks/pastries red meat, poultry, eggs.  -Sticking to a routine mealtime to eat 3 meals a day and avoiding unnecessary snacks is shown to have a big role in weight control. Under normal circumstances, the only time we lose real weight is when we are hungry, so allow hunger to take place- hunger means no food between meal times, only water.  It is not advisable to starve.   -It is better to avoid simple carbohydrates including: Cakes, Sweet Desserts, Ice Cream, Soda (diet and regular), Sweet Tea, Candies, Chips, Cookies, Store Bought Juices, Alcohol in Excess of  1-2 drinks a day, Lemonade,  Artificial Sweeteners, Doughnuts, Coffee Creamers, "Sugar-free" Products, etc, etc.  This is not a complete list.....    -Consulting with certified diabetes educators is proven to provide you with the most accurate and current information on diet.  Also, you may be  interested in discussing diet options/exchanges , we can schedule a visit with Russell Clark, RDN, CDE for  individualized nutrition education.  -Exercise: If you are able: 30 -60 minutes a day ,4 days a week, or 150 minutes a week.  The longer the better.  Combine stretch, strength, and aerobic activities.  If you were told in the past that you have high risk for cardiovascular diseases, you may seek evaluation by your heart doctor prior to initiating moderate to intense exercise programs.                                  Additional Care Considerations for Diabetes   -Diabetes  is a chronic disease.  The most important care consideration is regular follow-up with your diabetes care provider with the goal being avoiding or delaying its complications and to take advantage of advances in medications and technology.    - Whole Food, Plant Predominant Nutrition is highly recommended: Eat Plenty of vegetables, Mushrooms, fruits, Legumes, Whole Grains, Nuts, seeds in lieu of processed meats, processed snacks/pastries red meat, poultry, eggs.  -Type 2 diabetes is known to coexist with other important comorbidities such as high blood pressure and high cholesterol.  It is critical to control not only the diabetes but also the high blood pressure and high cholesterol to minimize and delay the risk of complications including coronary artery disease, stroke, amputations, blindness, etc.    - Studies showed that people with diabetes will benefit from a class of medications known as ACE inhibitors and statins.  Unless   there are specific reasons not to be on these medications, the standard of care is to consider getting one from these groups of medications at an optimal doses.  These medications are generally considered safe and proven to help protect the heart and the kidneys.    - People with diabetes are encouraged to initiate and maintain regular follow-up with eye doctors, foot doctors, dentists , and if necessary heart and kidney doctors.     - It is highly recommended that people with diabetes quit smoking or  stay away from smoking, and get yearly  flu vaccine and pneumonia vaccine at least every 5 years.  One other important lifestyle recommendation is to ensure adequate sleep - at least 6-7 hours of uninterrupted sleep at night.  -Exercise: If you are able: 30 -60 minutes a day, 4 days a week, or 150 minutes a week.  The longer the better.  Combine stretch, strength, and aerobic activities.  If you were told in the past that you have high risk for cardiovascular diseases, you may seek evaluation by your heart doctor prior to initiating moderate to intense exercise programs.         

## 2021-05-22 NOTE — Progress Notes (Signed)
05/22/2021   Endocrinology follow-up note    Subjective:    Patient ID: Russell Clark, male    DOB: November 18, 1965.  he is being seen in follow-up for management of currently uncontrolled symptomatic type 2 diabetes, hyperlipidemia, hypertension. PCP:  Garald Braver   Past Medical History:  Diagnosis Date   Diabetes mellitus, type II (HCC)    Hyperlipidemia    Hypertension    Past Surgical History:  Procedure Laterality Date   OTHER SURGICAL HISTORY     L foot, Cataracts   Social History   Socioeconomic History   Marital status: Divorced    Spouse name: Not on file   Number of children: Not on file   Years of education: Not on file   Highest education level: Not on file  Occupational History   Not on file  Tobacco Use   Smoking status: Former    Packs/day: 1.00    Years: 25.00    Pack years: 25.00    Types: Cigarettes   Smokeless tobacco: Never  Vaping Use   Vaping Use: Never used  Substance and Sexual Activity   Alcohol use: No   Drug use: No   Sexual activity: Not on file  Other Topics Concern   Not on file  Social History Narrative   Not on file   Social Determinants of Health   Financial Resource Strain: Not on file  Food Insecurity: Not on file  Transportation Needs: Not on file  Physical Activity: Not on file  Stress: Not on file  Social Connections: Not on file   Outpatient Encounter Medications as of 05/22/2021  Medication Sig   Dulaglutide (TRULICITY) 1.5 MG/0.5ML SOPN Inject 1.5 mg into the skin once a week.   buPROPion (WELLBUTRIN SR) 150 MG 12 hr tablet Take 150 mg by mouth 2 (two) times daily.   Cholecalciferol (VITAMIN D PO) Take by mouth.   cilostazol (PLETAL) 50 MG tablet 2 (two) times daily.   clopidogrel (PLAVIX) 75 MG tablet Take 75 mg by mouth daily.   Continuous Blood Gluc Receiver (DEXCOM G6 RECEIVER) DEVI 1 Piece by Does not apply route as needed.   Continuous Blood Gluc Sensor (DEXCOM G6 SENSOR) MISC 4 Pieces by Does not  apply route once a week.   Continuous Blood Gluc Transmit (DEXCOM G6 TRANSMITTER) MISC 1 Piece by Does not apply route as directed.   furosemide (LASIX) 20 MG tablet Take 20 mg by mouth every morning.   glipiZIDE (GLUCOTROL XL) 5 MG 24 hr tablet Take 1 tablet (5 mg total) by mouth daily with breakfast.   Insulin Pen Needle (B-D ULTRAFINE III SHORT PEN) 31G X 8 MM MISC USE PEN NEEDLES THREE TIMES DAILY AS DIRECTED   insulin regular human CONCENTRATED (HUMULIN R U-500 KWIKPEN) 500 UNIT/ML KwikPen Inject 140-170 Units into the skin 3 (three) times daily with meals.   Lidocaine 4 % LOTN Apply topically.   loperamide (IMODIUM A-D) 2 MG tablet Take 2 mg by mouth daily.    losartan-hydrochlorothiazide (HYZAAR) 100-12.5 MG tablet Take 1 tablet by mouth daily.   metFORMIN (GLUCOPHAGE) 500 MG tablet TAKE 1 TABLET BY MOUTH TWICE DAILY WITH MEALS   nortriptyline (PAMELOR) 25 MG capsule Take 25 mg by mouth as needed.    omeprazole (PRILOSEC) 40 MG capsule Take 40 mg by mouth daily.   pregabalin (LYRICA) 300 MG capsule Take 300 mg by mouth 2 (two) times daily.    pyridOXINE (VITAMIN B-6) 100 MG tablet Take 100  mg by mouth daily.   rosuvastatin (CRESTOR) 20 MG tablet Take 20 mg by mouth at bedtime.   VITAMIN D, ERGOCALCIFEROL, PO Take by mouth.   [DISCONTINUED] glipiZIDE (GLUCOTROL XL) 5 MG 24 hr tablet TAKE 2 TABLETS BY MOUTH ONCE DAILY WITH BREAKFAST   [DISCONTINUED] insulin regular human CONCENTRATED (HUMULIN R U-500 KWIKPEN) 500 UNIT/ML kwikpen Inject 140-160 Units into the skin 3 (three) times daily with meals.   [DISCONTINUED] TRULICITY 0.75 MG/0.5ML SOPN INJECT 0.75MG   SUBCUTANEOUSLY ONCE A WEEK   No facility-administered encounter medications on file as of 05/22/2021.    ALLERGIES: Allergies  Allergen Reactions   Aspirin    Darvon [Propoxyphene]    Ibuprofen    Penicillins     VACCINATION STATUS:  There is no immunization history on file for this patient.  Diabetes He presents for his  follow-up diabetic visit. He has type 2 diabetes mellitus. Onset time: He was diagnosed at approximate age of 41 years. His disease course has been improving. There are no hypoglycemic associated symptoms. Pertinent negatives for hypoglycemia include no confusion, headaches, pallor or seizures. Associated symptoms include polydipsia and polyuria. Pertinent negatives for diabetes include no blurred vision, no chest pain, no fatigue, no polyphagia and no weakness. There are no hypoglycemic complications. Symptoms are improving. Diabetic complications include peripheral neuropathy. Risk factors for coronary artery disease include dyslipidemia, diabetes mellitus, hypertension, male sex, sedentary lifestyle, tobacco exposure and obesity. Current diabetic treatment includes insulin injections and oral agent (dual therapy). He is compliant with treatment most of the time. His weight is decreasing steadily (Here in the process of getting clearance for sleeve gastrectomy by different medical specialties.). He is following a generally unhealthy diet. When asked about meal planning, he reported none. He never participates in exercise. His home blood glucose trend is decreasing steadily. His breakfast blood glucose range is generally 140-180 mg/dl. His lunch blood glucose range is generally >200 mg/dl. His dinner blood glucose range is generally >200 mg/dl. His bedtime blood glucose range is generally >200 mg/dl. His overall blood glucose range is >200 mg/dl. (He brings in his CGM device, analysis shows 30% with a range, 70% hyperglycemia mostly following his breakfast and lunch.  His point-of-care A1c is 8.5%, progressively improving from 11.8%.  He did not have any hypoglycemia.  ) An ACE inhibitor/angiotensin II receptor blocker is being taken. He sees a podiatrist.Eye exam is current (He is due for his dilated eye exam, promises to do before next visit.).  Hyperlipidemia This is a chronic problem. The current episode  started more than 1 year ago. The problem is uncontrolled. Recent lipid tests were reviewed and are high. Exacerbating diseases include diabetes and obesity. Factors aggravating his hyperlipidemia include smoking. Pertinent negatives include no chest pain, myalgias or shortness of breath. Current antihyperlipidemic treatment includes statins. Risk factors for coronary artery disease include dyslipidemia, diabetes mellitus, a sedentary lifestyle and obesity.  Hypertension This is a chronic problem. The current episode started more than 1 year ago. The problem is controlled. Pertinent negatives include no blurred vision, chest pain, headaches, neck pain, palpitations or shortness of breath. Risk factors for coronary artery disease include dyslipidemia, diabetes mellitus, male gender, sedentary lifestyle and smoking/tobacco exposure. Past treatments include angiotensin blockers.   Review of systems    Objective:    Ht  (1.676 m)   Wt 235 lb (106.6 kg)   BMI 37.93 kg/m   Wt Readings from Last 3 Encounters:  05/22/21 235 lb (106.6 kg)  02/14/21  245 lb 12.8 oz (111.5 kg)  12/13/20 243 lb 9.6 oz (110.5 kg)     Physical Exam- Limited    Recent Results (from the past 2160 hour(s))  HgB A1c     Status: Abnormal   Collection Time: 05/22/21  2:47 PM  Result Value Ref Range   Hemoglobin A1C     HbA1c POC (<> result, manual entry)     HbA1c, POC (prediabetic range)     HbA1c, POC (controlled diabetic range) 8.5 (A) 0.0 - 7.0 %   CMP Latest Ref Rng & Units 12/06/2020 05/18/2020 09/19/2019  Glucose 65 - 99 mg/dL 291(B) - -  BUN 6 - 24 mg/dL 16(O) 06(Y) 18  Creatinine 0.76 - 1.27 mg/dL 0.45 1.3 9.9(H)  Sodium 134 - 144 mmol/L 136 - -  Potassium 3.5 - 5.2 mmol/L 4.2 4.6 -  Chloride 96 - 106 mmol/L 97 - -  CO2 20 - 29 mmol/L 21 - -  Calcium 8.7 - 10.2 mg/dL 8.8 7.4(F) -  Total Protein 6.0 - 8.5 g/dL 4.2(L) - -  Total Bilirubin 0.0 - 1.2 mg/dL 0.4 - -  Alkaline Phos 44 - 121 IU/L 137(H) - -   AST 0 - 40 IU/L 27 - -  ALT 0 - 44 IU/L 34 - -   Lipid Panel     Component Value Date/Time   CHOL 85 (L) 12/06/2020 0831   TRIG 324 (H) 12/06/2020 0831   HDL 21 (L) 12/06/2020 0831   CHOLHDL 4.0 12/06/2020 0831   LDLCALC 18 12/06/2020 0831   LABVLDL 46 (H) 12/06/2020 0831     Assessment & Plan:   1. Uncontrolled type 2 diabetes mellitus with other specified complication, with [place]-term current use of insulin (HCC)  - Patient has currently uncontrolled symptomatic type 2 DM since  55 years of age.  He brings in his CGM device, analysis shows 30% with a range, 70% hyperglycemia mostly following his breakfast and lunch.  His point-of-care A1c is 8.5%, progressively improving from 11.8%.  He did not have any hypoglycemia.   -Recent labs reviewed, showing normal renal function,.  -his diabetes is complicated by peripheral neuropathy, peripheral arterial disease,  sedentary life and [place] E [place] remains at extremely  high risk for more acute and chronic complications which include CAD, CVA, CKD, retinopathy, and neuropathy. These are all discussed in detail with the patient.  - I have counseled him on diet management and weight loss, by adopting a carbohydrate restricted/protein rich diet. - he acknowledges that there is a room for improvement in his food and drink choices. - Suggestion is made for him to avoid simple carbohydrates  from his diet including Cakes, Sweet Desserts, Ice Cream, Soda (diet and regular), Sweet Tea, Candies, Chips, Cookies, Store Bought Juices, Alcohol in Excess of  1-2 drinks a day, Artificial Sweeteners,  Coffee Creamer, and "Sugar-free" Products, Lemonade. This will help patient to have more stable blood glucose profile and potentially avoid unintended weight gain.   - I encouraged him to switch to  unprocessed or minimally processed complex starch and increased protein intake (animal or plant source), fruits, and vegetables.  - he is advised to stick to a  routine mealtimes to eat 3 meals  a day and avoid unnecessary snacks ( to snack only to correct hypoglycemia).    - I have approached him with the following individualized plan to manage diabetes and patient agrees:   -He will continue to need intensive treatment with high-dose insulin in  order for him to maintain control of diabetes towards target. -Due to his postprandial hyperglycemia, he is advised to increase U50 0-170 at breakfast, 160 at lunch, 140 at supper   -  for [place] blood glucose readings above 90 mg per DL. -He is advised not to inject insulin without a meal and without proper monitoring of blood glucose. He is encouraged to use his CGM at all times, especially before meals and at bedtime.  -He is currently using Dexcom CGM, advised to use it at all times.  -Patient is encouraged to call clinic for blood glucose levels less than 70 or above 300 mg /dl. -He is advised to continue Metformin 500 mg p.o. twice daily---after breakfast and supper.   - He is advised to lower glipizide to 5 mg XL p.o. daily at breakfast.  He has tolerated Trulicity low-dose.  I discussed and increase his Trulicity to 1.5 mg subcutaneously weekly.     Side effects and precautions discussed with him.     - Patient specific target  A1c;  LDL, HDL, Triglycerides were discussed in detail.  2) BP/HTN:  -His blood pressure is controlled to target.  He is advised to continue blood pressure medications including Hyzaar.   3) Lipids/HPL: His recent lipid panel showed significantly improved lipid panel with LDL of 18, triglycerides 324.   -He is approached with the fact that better  control of diabetes will help control triglycerides.  He is advised to continue pravastatin 40 mg p.o. nightly as well as fenofibrate 140 mg p.o. nightly.   4)  Weight/Diet: His BMI 37.9-improving, still  complicating his diabetes care.  He has started the process of bariatric surgery-currently being cleared by different  specialties for sleeve gastrectomy. He will greatly benefit from this procedure, and is encouraged to continue to pursue.    In the meantime, CDE Consult  in progress , exercise as tolerated, and detailed carbohydrates information provided.  5) Chronic Care/Health Maintenance:  -he  is on ACEI/ARB and Statin medications and  is encouraged to continue to follow up with Ophthalmology (he is significantly overdue), Dentist,  Podiatrist at least yearly or according to recommendations, and advised to  stay away from smoking. I have recommended yearly flu vaccine and pneumonia vaccination at least every 5 years; moderate intensity exercise for up to 150 minutes weekly; and  sleep for at least 7 hours a day.  Point-of-care urine microalbumin test is negative.  He is encouraged to continue follow-up in the vein clinic.  - I advised patient to maintain close follow up with Richarda BladeElliott, Dianne E for primary care needs.    I spent 444 minutes in the care of the patient today including review of labs from CMP, Lipids, Thyroid Function, Hematology (current and previous including abstractions from other facilities); face-to-face time discussing  his blood glucose readings/logs, discussing hypoglycemia and hyperglycemia episodes and symptoms, medications doses, his options of short and [place] term treatment based on the latest standards of care / guidelines;  discussion about incorporating lifestyle medicine;  and documenting the encounter.    Please refer to Patient Instructions for Blood Glucose Monitoring and Insulin/Medications Dosing Guide"  in media tab for additional information. Please  also refer to " Patient Self Inventory" in the Media  tab for reviewed elements of pertinent patient history.  Rinaldo Ratellaude E Smigelski participated in the discussions, expressed understanding, and voiced agreement with the above plans.  All questions were answered to his satisfaction. he is encouraged to contact clinic  should he have  any questions or concerns prior to his return visit.    Follow up plan: - Return in about 3 months (around 08/21/2021) for Bring Meter and Logs- A1c in Office.  Marquis Lunch, MD Phone: [phone]  Fax: [phone]   05/22/2021, 5:41 PM   This note was partially dictated with voice recognition software. Similar sounding words can be transcribed inadequately or may not  be corrected upon review.

## 2021-06-20 ENCOUNTER — Other Ambulatory Visit: Payer: Self-pay | Admitting: "Endocrinology

## 2021-08-11 ENCOUNTER — Other Ambulatory Visit: Payer: Self-pay | Admitting: "Endocrinology

## 2021-08-16 LAB — COMPREHENSIVE METABOLIC PANEL
ALT: 26 IU/L (ref 0–44)
AST: 24 IU/L (ref 0–40)
Albumin/Globulin Ratio: 1.9 (ref 1.2–2.2)
Albumin: 4 g/dL (ref 3.8–4.9)
Alkaline Phosphatase: 129 IU/L — ABNORMAL HIGH (ref 44–121)
BUN/Creatinine Ratio: 12 (ref 9–20)
BUN: 13 mg/dL (ref 6–24)
Bilirubin Total: 0.5 mg/dL (ref 0.0–1.2)
CO2: 27 mmol/L (ref 20–29)
Calcium: 8.7 mg/dL (ref 8.7–10.2)
Chloride: 96 mmol/L (ref 96–106)
Creatinine, Ser: 1.08 mg/dL (ref 0.76–1.27)
Globulin, Total: 2.1 g/dL (ref 1.5–4.5)
Glucose: 284 mg/dL — ABNORMAL HIGH (ref 70–99)
Potassium: 4 mmol/L (ref 3.5–5.2)
Sodium: 138 mmol/L (ref 134–144)
Total Protein: 6.1 g/dL (ref 6.0–8.5)
eGFR: 82 mL/min/{1.73_m2} (ref >59)

## 2021-08-16 LAB — LIPID PANEL
Chol/HDL Ratio: 3.3 ratio (ref 0.0–5.0)
Cholesterol, Total: 85 mg/dL — ABNORMAL LOW (ref 100–199)
HDL: 26 mg/dL — ABNORMAL LOW (ref >39)
LDL Chol Calc (NIH): 27 mg/dL (ref 0–99)
Triglycerides: 199 mg/dL — ABNORMAL HIGH (ref 0–149)
VLDL Cholesterol Cal: 32 mg/dL (ref 5–40)

## 2021-08-26 ENCOUNTER — Encounter: Payer: Self-pay | Admitting: "Endocrinology

## 2021-08-26 ENCOUNTER — Ambulatory Visit (INDEPENDENT_AMBULATORY_CARE_PROVIDER_SITE_OTHER): Payer: Medicare Other | Admitting: "Endocrinology

## 2021-08-26 ENCOUNTER — Other Ambulatory Visit: Payer: Self-pay

## 2021-08-26 VITALS — BP 126/72 | HR 96 | Ht 66.0 in | Wt 229.2 lb

## 2021-08-26 DIAGNOSIS — I10 Essential (primary) hypertension: Secondary | ICD-10-CM | POA: Diagnosis not present

## 2021-08-26 DIAGNOSIS — E1165 Type 2 diabetes mellitus with hyperglycemia: Secondary | ICD-10-CM

## 2021-08-26 DIAGNOSIS — E782 Mixed hyperlipidemia: Secondary | ICD-10-CM

## 2021-08-26 LAB — POCT GLYCOSYLATED HEMOGLOBIN (HGB A1C): HbA1c, POC (controlled diabetic range): 10.4 % — AB (ref 0.0–7.0)

## 2021-08-26 MED ORDER — HUMULIN R U-500 KWIKPEN 500 UNIT/ML ~~LOC~~ SOPN: 140.0000 [IU] | PEN_INJECTOR | Freq: Three times a day (TID) | SUBCUTANEOUS | 0 refills | Status: DC

## 2021-08-26 NOTE — Patient Instructions (Signed)

## 2021-08-26 NOTE — Progress Notes (Signed)
08/26/2021   Endocrinology follow-up note    Subjective:    Patient ID: Russell Clark, male    DOB: [birth year].  he is being seen in follow-up for management of currently uncontrolled symptomatic type 2 diabetes, hyperlipidemia, hypertension. PCP:  Thea Alken   Past Medical History:  Diagnosis Date   Diabetes mellitus, type II ([place])    Hyperlipidemia    Hypertension    Past Surgical History:  Procedure Laterality Date   OTHER SURGICAL HISTORY     L foot, Cataracts   Social History   Socioeconomic History   Marital status: Divorced    Spouse name: Not on file   Number of children: Not on file   Years of education: Not on file   Highest education level: Not on file  Occupational History   Not on file  Tobacco Use   Smoking status: Former    Packs/day: 1.00    Years: 25.00    Pack years: 25.00    Types: Cigarettes   Smokeless tobacco: Never  Vaping Use   Vaping Use: Never used  Substance and Sexual Activity   Alcohol use: No   Drug use: No   Sexual activity: Not on file  Other Topics Concern   Not on file  Social History Narrative   Not on file   Social Determinants of Health   Financial Resource Strain: Not on file  Food Insecurity: Not on file  Transportation Needs: Not on file  Physical Activity: Not on file  Stress: Not on file  Social Connections: Not on file   Outpatient Encounter Medications as of 08/26/2021  Medication Sig   buPROPion (WELLBUTRIN SR) 150 MG 12 hr tablet Take 150 mg by mouth 2 (two) times daily.   Cholecalciferol (VITAMIN D PO) Take by mouth.   cilostazol (PLETAL) 50 MG tablet 2 (two) times daily.   clopidogrel (PLAVIX) 75 MG tablet Take 75 mg by mouth daily.   Continuous Blood Gluc Receiver (DEXCOM G6 RECEIVER) DEVI 1 Piece by Does not apply route as needed.   Continuous Blood Gluc Sensor (DEXCOM G6 SENSOR) MISC 4 Pieces by Does not apply route once a week.   Continuous Blood Gluc Transmit (DEXCOM G6 TRANSMITTER)  MISC 1 Piece by Does not apply route as directed.   furosemide (LASIX) 20 MG tablet Take 20 mg by mouth every morning.   glipiZIDE (GLUCOTROL XL) 5 MG 24 hr tablet Take 1 tablet (5 mg total) by mouth daily with breakfast.   Insulin Pen Needle (B-D ULTRAFINE III SHORT PEN) 31G X 8 MM MISC USE PEN NEEDLES THREE TIMES DAILY AS DIRECTED   insulin regular human CONCENTRATED (HUMULIN R U-500 KWIKPEN) 500 UNIT/ML KwikPen Inject 140-150 Units into the skin 3 (three) times daily with meals.   Lidocaine 4 % LOTN Apply topically.   loperamide (IMODIUM A-D) 2 MG tablet Take 2 mg by mouth daily.    losartan-hydrochlorothiazide (HYZAAR) 100-12.5 MG tablet Take 1 tablet by mouth daily.   metFORMIN (GLUCOPHAGE) 500 MG tablet TAKE 1 TABLET BY MOUTH TWICE DAILY WITH MEALS   nortriptyline (PAMELOR) 25 MG capsule Take 25 mg by mouth as needed.    omeprazole (PRILOSEC) 40 MG capsule Take 40 mg by mouth daily.   pregabalin (LYRICA) 300 MG capsule Take 300 mg by mouth 2 (two) times daily.    pyridOXINE (VITAMIN B-6) 100 MG tablet Take 100 mg by mouth daily.   rosuvastatin (CRESTOR) 20 MG tablet Take 20 mg by mouth  at bedtime.   TRULICITY 1.5 ZD/6.6YQ SOPN INJECT 1.5 MG SUB-Q INTO THE SKIN ONCE A WEEK   VITAMIN D, ERGOCALCIFEROL, PO Take by mouth.   [DISCONTINUED] insulin regular human CONCENTRATED (HUMULIN R U-500 KWIKPEN) 500 UNIT/ML KwikPen Inject 140-170 Units into the skin 3 (three) times daily with meals.   No facility-administered encounter medications on file as of 08/26/2021.    ALLERGIES: Allergies  Allergen Reactions   Aspirin    Darvon [Propoxyphene]    Ibuprofen    Penicillins     VACCINATION STATUS:  There is no immunization history on file for this patient.  Diabetes He presents for his follow-up diabetic visit. He has type 2 diabetes mellitus. Onset time: He was diagnosed at approximate age of 38 years. His disease course has been worsening. There are no hypoglycemic associated symptoms.  Pertinent negatives for hypoglycemia include no confusion, headaches, pallor or seizures. Associated symptoms include polydipsia and polyuria. Pertinent negatives for diabetes include no blurred vision, no chest pain, no fatigue, no polyphagia and no weakness. There are no hypoglycemic complications. Symptoms are worsening. Diabetic complications include peripheral neuropathy. Risk factors for coronary artery disease include dyslipidemia, diabetes mellitus, hypertension, male sex, sedentary lifestyle, tobacco exposure and obesity. Current diabetic treatment includes insulin injections and oral agent (dual therapy). He is compliant with treatment most of the time. His weight is decreasing steadily (Here in the process of getting clearance for sleeve gastrectomy by different medical specialties.). He is following a generally unhealthy diet. When asked about meal planning, he reported none. He never participates in exercise. His home blood glucose trend is increasing steadily. His breakfast blood glucose range is generally >200 mg/dl. His lunch blood glucose range is generally >200 mg/dl. His dinner blood glucose range is generally >200 mg/dl. His bedtime blood glucose range is generally >200 mg/dl. His overall blood glucose range is >200 mg/dl. (Mr. Trickett presents with loss of control of glycemia.  His CGM device shows 100% above range.  His point-of-care A1c is 10.4% worsening from 8.5% during his last visit.  This is despite large dose of insulin in the form of U500 and multiple other medications.  He did not document any hypoglycemia.    ) An ACE inhibitor/angiotensin II receptor blocker is being taken. He sees a podiatrist.Eye exam is current (He is due for his dilated eye exam, promises to do before next visit.).  Hyperlipidemia This is a chronic problem. The current episode started more than 1 year ago. The problem is uncontrolled. Recent lipid tests were reviewed and are high. Exacerbating diseases include  diabetes and obesity. Factors aggravating his hyperlipidemia include smoking. Pertinent negatives include no chest pain, myalgias or shortness of breath. Current antihyperlipidemic treatment includes statins. Risk factors for coronary artery disease include dyslipidemia, diabetes mellitus, a sedentary lifestyle and obesity.  Hypertension This is a chronic problem. The current episode started more than 1 year ago. The problem is controlled. Pertinent negatives include no blurred vision, chest pain, headaches, neck pain, palpitations or shortness of breath. Risk factors for coronary artery disease include dyslipidemia, diabetes mellitus, male gender, sedentary lifestyle and smoking/tobacco exposure. Past treatments include angiotensin blockers.   Review of systems    Objective:    BP 126/72   Pulse 96   Ht 5' 6"  (1.676 m)   Wt 229 lb 3.2 oz (104 kg)   BMI 36.99 kg/m   Wt Readings from Last 3 Encounters:  08/26/21 229 lb 3.2 oz (104 kg)  05/22/21 235 lb (106.6  kg)  02/14/21 245 lb 12.8 oz (111.5 kg)     Physical Exam- Limited    Recent Results (from the past 2160 hour(s))  Comprehensive metabolic panel     Status: Abnormal   Collection Time: 08/15/21 10:29 AM  Result Value Ref Range   Glucose 284 (H) 70 - 99 mg/dL   BUN 13 6 - 24 mg/dL   Creatinine, Ser 1.08 0.76 - 1.27 mg/dL   eGFR 82 >59 mL/min/1.73   BUN/Creatinine Ratio 12 9 - 20   Sodium 138 134 - 144 mmol/L   Potassium 4.0 3.5 - 5.2 mmol/L   Chloride 96 96 - 106 mmol/L   CO2 27 20 - 29 mmol/L   Calcium 8.7 8.7 - 10.2 mg/dL   Total Protein 6.1 6.0 - 8.5 g/dL   Albumin 4.0 3.8 - 4.9 g/dL   Globulin, Total 2.1 1.5 - 4.5 g/dL   Albumin/Globulin Ratio 1.9 1.2 - 2.2   Bilirubin Total 0.5 0.0 - 1.2 mg/dL   Alkaline Phosphatase 129 (H) 44 - 121 IU/L   AST 24 0 - 40 IU/L   ALT 26 0 - 44 IU/L  Lipid panel     Status: Abnormal   Collection Time: 08/15/21 10:29 AM  Result Value Ref Range   Cholesterol, Total 85 (L) 100 - 199  mg/dL   Triglycerides 199 (H) 0 - 149 mg/dL   HDL 26 (L) >39 mg/dL   VLDL Cholesterol Cal 32 5 - 40 mg/dL   LDL Chol Calc (NIH) 27 0 - 99 mg/dL   Chol/HDL Ratio 3.3 0.0 - 5.0 ratio    Comment:                                   T. Chol/HDL Ratio                                             Men  Women                               1/2 Avg.Risk  3.4    3.3                                   Avg.Risk  5.0    4.4                                2X Avg.Risk  9.6    7.1                                3X Avg.Risk 23.4   11.0   HgB A1c     Status: Abnormal   Collection Time: 08/26/21  2:05 PM  Result Value Ref Range   Hemoglobin A1C     HbA1c POC (<> result, manual entry)     HbA1c, POC (prediabetic range)     HbA1c, POC (controlled diabetic range) 10.4 (A) 0.0 - 7.0 %   CMP Latest Ref Rng & Units 08/15/2021 12/06/2020 05/18/2020  Glucose 70 - 99 mg/dL 284(H) 352(H) -  BUN  6 - 24 mg/dL 13 25(H) 24(A)  Creatinine 0.76 - 1.27 mg/dL 1.08 1.27 1.3  Sodium 134 - 144 mmol/L 138 136 -  Potassium 3.5 - 5.2 mmol/L 4.0 4.2 4.6  Chloride 96 - 106 mmol/L 96 97 -  CO2 20 - 29 mmol/L 27 21 -  Calcium 8.7 - 10.2 mg/dL 8.7 8.8 8.6(A)  Total Protein 6.0 - 8.5 g/dL 6.1 5.8(L) -  Total Bilirubin 0.0 - 1.2 mg/dL 0.5 0.4 -  Alkaline Phos 44 - 121 IU/L 129(H) 137(H) -  AST 0 - 40 IU/L 24 27 -  ALT 0 - 44 IU/L 26 34 -   Lipid Panel     Component Value Date/Time   CHOL 85 (L) 08/15/2021 1029   TRIG 199 (H) 08/15/2021 1029   HDL 26 (L) 08/15/2021 1029   CHOLHDL 3.3 08/15/2021 1029   LDLCALC 27 08/15/2021 1029   LABVLDL 32 08/15/2021 1029     Assessment & Plan:   1. Uncontrolled type 2 diabetes mellitus with other specified complication, with [place]-term current use of insulin ([place])  - Patient has currently uncontrolled symptomatic type 2 DM since  55 years of age.  Mr. Sterkel presents with loss of control of glycemia.  His CGM device shows 100% above range.  His point-of-care A1c is 10.4% worsening from  8.5% during his last visit.  This is despite large dose of insulin in the form of U500 and multiple other medications.  He did not document any hypoglycemia.     -Recent labs reviewed, showing normal renal function,.  -his diabetes is complicated by peripheral neuropathy, peripheral arterial disease,  sedentary life and Ancel E Arenz remains at extremely  high risk for more acute and chronic complications which include CAD, CVA, CKD, retinopathy, and neuropathy. These are all discussed in detail with the patient.  - I have counseled him on diet management and weight loss, by adopting a carbohydrate restricted/protein rich diet.  - he acknowledges that there is a room for improvement in his food and drink choices. - Suggestion is made for him to avoid simple carbohydrates  from his diet including Cakes, Sweet Desserts, Ice Cream, Soda (diet and regular), Sweet Tea, Candies, Chips, Cookies, Store Bought Juices, Alcohol , Artificial Sweeteners,  Coffee Creamer, and "Sugar-free" Products, Lemonade. This will help patient to have more stable blood glucose profile and potentially avoid unintended weight gain.  The following Lifestyle Medicine recommendations according to [place]  [facility]) were discussed and and offered to patient and he  agrees to start the journey:  A. Whole Foods, Plant-Based Nutrition comprising of fruits and vegetables, plant-based proteins, whole-grain carbohydrates was discussed in detail with the patient.   A list for source of those nutrients were also provided to the patient.  Patient will use only water or unsweetened tea for hydration. B.  The need to stay away from risky substances including alcohol, smoking; obtaining 7 to 9 hours of restorative sleep, at least 150 minutes of moderate intensity exercise weekly, the importance of healthy social connections,  and stress management techniques were discussed. C.  A full color page of  Calorie density  of various food groups per pound showing examples of each food groups was provided to the patient.    - I encouraged him to switch to  unprocessed or minimally processed complex starch and increased protein intake (animal or plant source), fruits, and vegetables.  - he is advised to stick to a routine mealtimes  to eat 3 meals  a day and avoid unnecessary snacks ( to snack only to correct hypoglycemia).    - I have approached him with the following individualized plan to manage diabetes and patient agrees:   -While he is working on his lifestyle adjustment, I discussed and lowered his insulin U500 in the interest of avoiding inadvertent hypoglycemia.    He is advised to use insulin U500 150 units with breakfast, 150 units with lunch, and 140 units with supper   for [place] blood glucose readings above 90 mg per DL. -He is advised not to inject insulin without a meal and without proper monitoring of blood glucose. He is encouraged to use his CGM at all times, especially before meals and at bedtime.  -He is currently using Dexcom CGM, advised to use it at all times.  -Patient is encouraged to call clinic for blood glucose levels less than 70 or above 200 mg /dl. -He is advised to continue metformin 500 mg p.o. twice daily, glipizide 5 mg XL p.o. daily at breakfast.  He is also tolerating Trulicity.  I advised him to continue Trulicity 1.5 mg subcutaneously weekly.     Side effects and precautions discussed with him.     - Patient specific target  A1c;  LDL, HDL, Triglycerides were discussed in detail.  2) BP/HTN:  -His blood pressure is controlled to target.  He is advised to continue blood pressure medications including Hyzaar.   3) Lipids/HPL: His recent lipid panel showed significantly improved lipid panel with LDL of 18, triglycerides 324.  The above discussed lifestyle nutrition will have significant impact on his dyslipidemia as well.  In the meantime, he is advised to continue  pravastatin 40 mg p.o. nightly as well as fenofibrate 140 mg p.o. nightly.   4)  Weight/Diet: His BMI 36.9, lost 10+ pounds since last visit.  He has abandoned the pursuit of bariatric surgery at this time.      In the meantime, CDE Consult  in progress , exercise as tolerated, and detailed carbohydrates information provided.  5) Chronic Care/Health Maintenance:  -he  is on ACEI/ARB and Statin medications and  is encouraged to continue to follow up with Ophthalmology (he is significantly overdue), Dentist,  Podiatrist at least yearly or according to recommendations, and advised to  stay away from smoking. I have recommended yearly flu vaccine and pneumonia vaccination at least every 5 years; moderate intensity exercise for up to 150 minutes weekly; and  sleep for at least 7 hours a day.  Point-of-care urine microalbumin test is negative.  He is encouraged to continue follow-up in the vein clinic.  - I advised patient to maintain close follow up with Ephriam Jenkins E for primary care needs.    I spent 42 minutes in the care of the patient today including review of labs from [place], Lipids, Thyroid Function, Hematology (current and previous including abstractions from other facilities); face-to-face time discussing  his blood glucose readings/logs, discussing hypoglycemia and hyperglycemia episodes and symptoms, medications doses, his options of short and [place] term treatment based on the latest standards of care / guidelines;  discussion about incorporating lifestyle medicine;  and documenting the encounter.    Please refer to Patient Instructions for Blood Glucose Monitoring and Insulin/Medications Dosing Guide"  in media tab for additional information. Please  also refer to " Patient Self Inventory" in the Media  tab for reviewed elements of pertinent patient history.  Carolin Sicks Horvath participated in the discussions, expressed  understanding, and voiced agreement with the above plans.  All questions  were answered to his satisfaction. he is encouraged to contact clinic should he have any questions or concerns prior to his return visit.     Follow up plan: - Return in about 3 months (around 11/24/2021) for Bring Meter and Logs- A1c in Office.  Glade Lloyd, MD Phone: [phone]  Fax: [phone]   08/26/2021, 5:52 PM   This note was partially dictated with voice recognition software. Similar sounding words can be transcribed inadequately or may not  be corrected upon review.

## 2021-09-08 ENCOUNTER — Other Ambulatory Visit: Payer: Self-pay | Admitting: "Endocrinology

## 2021-09-08 IMAGING — RF DG UGI W SINGLE CM
10 of 12 series · 15 of 22 positions shown · non-contrast
Comparison: None.

CLINICAL DATA: Uncontrolled diabetes. Obesity. Preop gastric sleeve
surgery.

EXAM:
UPPER GI SERIES WITH KUB
TECHNIQUE: After obtaining a scout radiograph a routine upper GI series was
performed using thin barium
FLUOROSCOPY TIME:  Fluoroscopy Time:  1 minutes 24 seconds
Radiation Exposure Index (if provided by the fluoroscopic device):
Number of Acquired Spot Images: 12

[Series 1: t abdomen supine · 0.15mm/px · 1 of 1 slices shown]
[im 1/1]
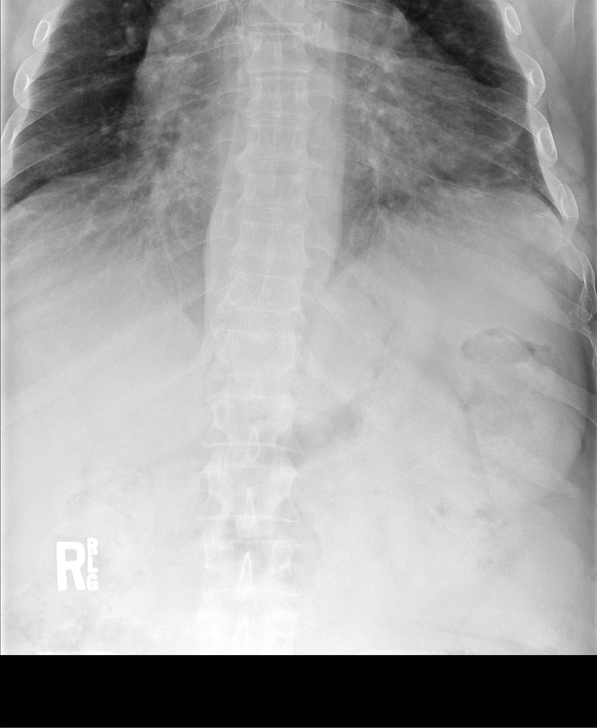

[Series 2: cp_standard · 0.55mm/px · 2 of 150 frames shown (1 of 4)]
[frame 76/150]
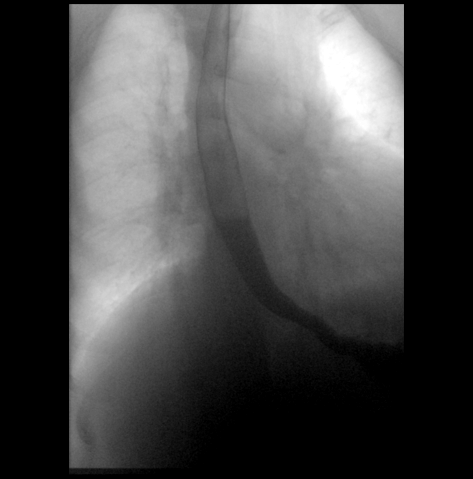
[frame 105/150]
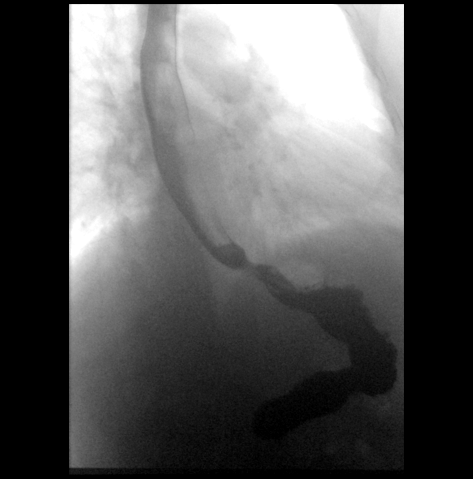

[Series 3: cp_standard · 0.55mm/px · 3 of 83 frames shown (2 of 4)]
[frame 13/83]
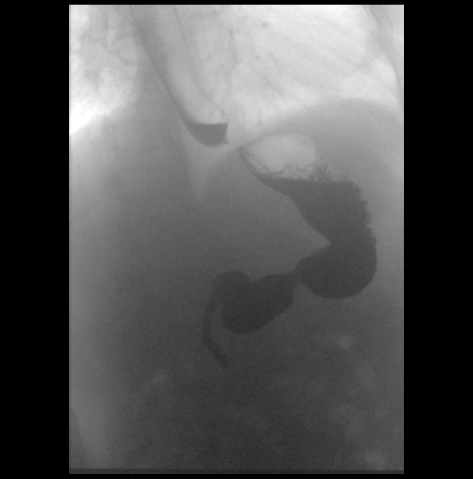
[frame 42/83]
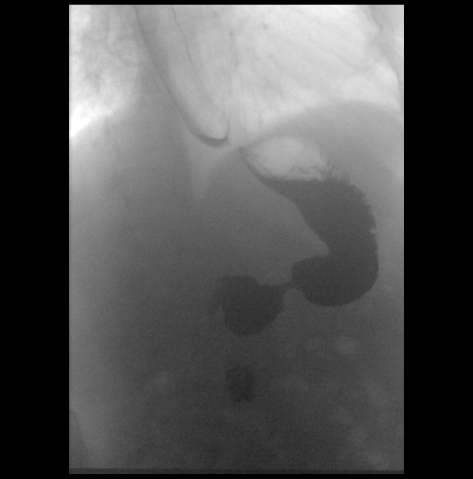
[frame 72/83]
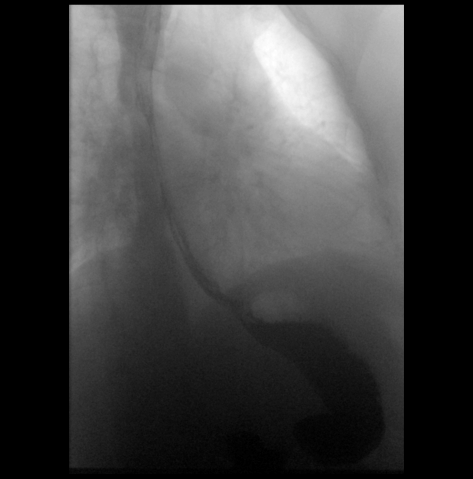

[Series 4: fluoro_barium 2fps_bw · 0.19mm/px · 1 of 2 frames shown (1 of 5)]
[frame 1/2]
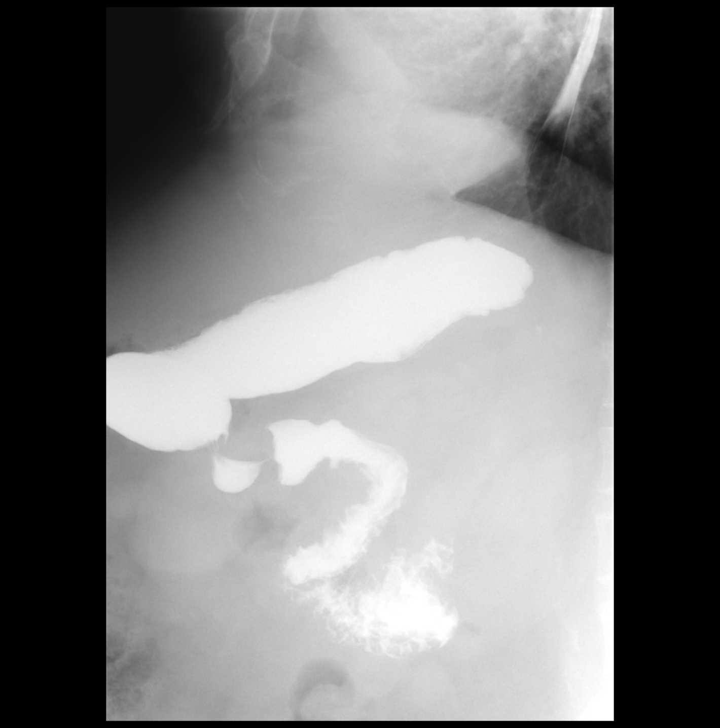

[Series 5: fluoro_barium 2fps_bw · 0.19mm/px · 1 of 1 slices shown (2 of 5)]
[im 1/1]
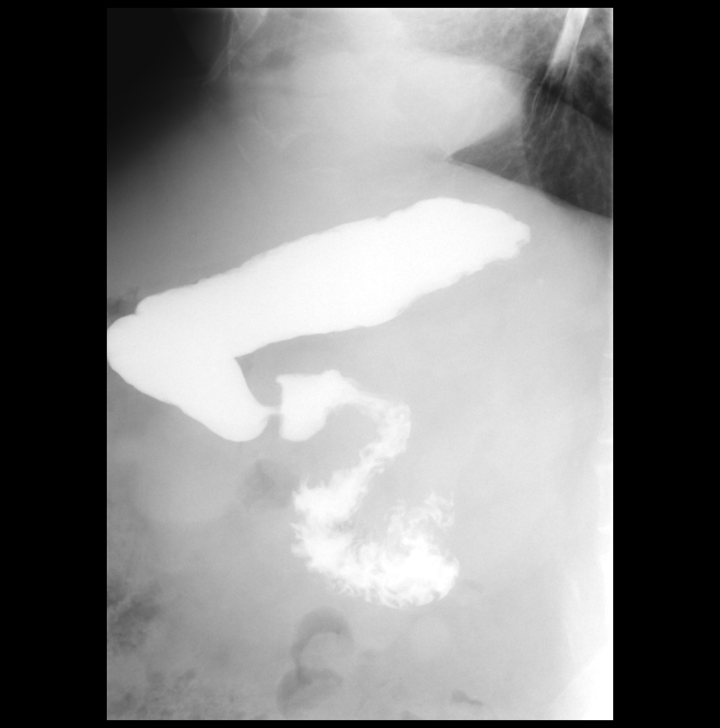

[Series 6: fluoro_barium 2fps_bw · 0.19mm/px · 1 of 1 slices shown (3 of 5)]
[im 1/1]
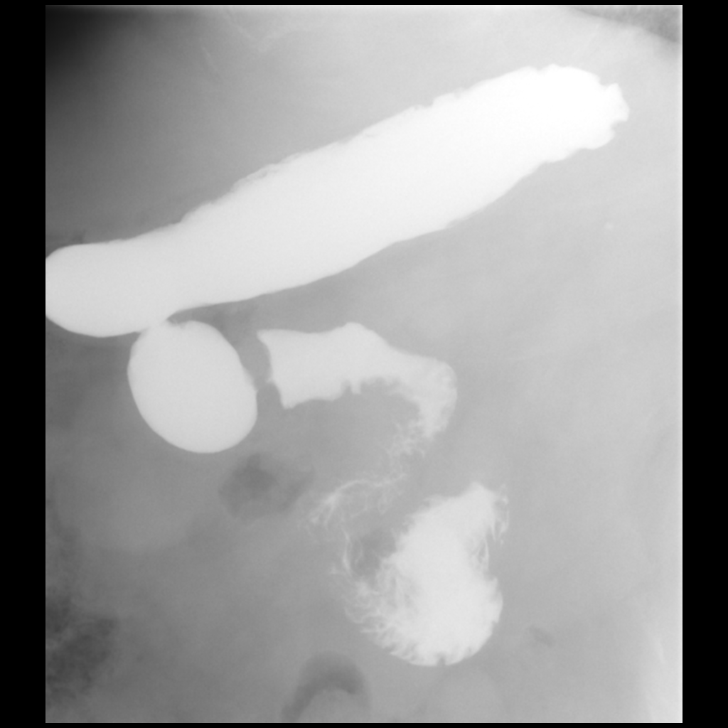

[Series 7: fluoro_barium 2fps_bw · 0.20mm/px · 1 of 1 slices shown (4 of 5)]
[im 1/1]
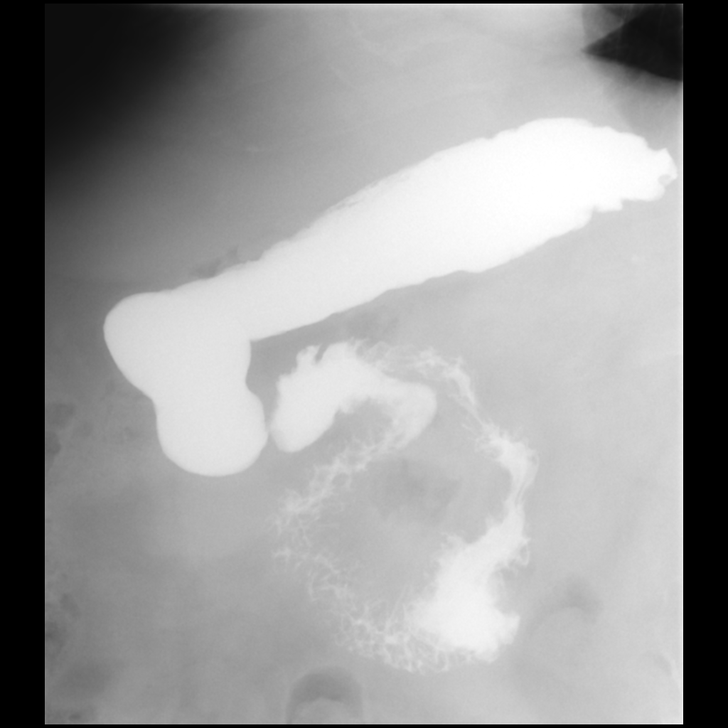

[Series 9: fluoro_barium 2fps_bw · 0.19mm/px · 1 of 1 slices shown (5 of 5)]
[im 1/1]
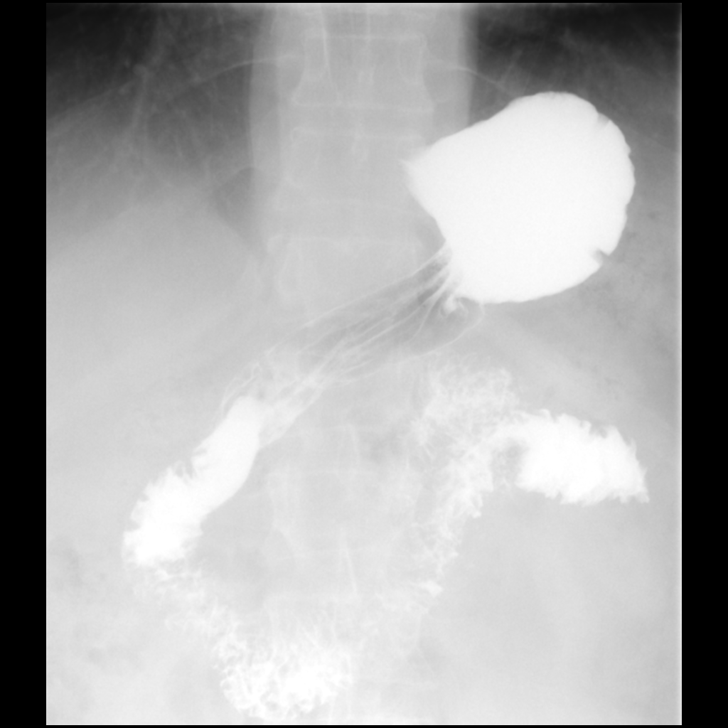

[Series 10: cp_standard · 0.37mm/px · 3 of 149 frames shown (3 of 4)]
[frame 23/149]
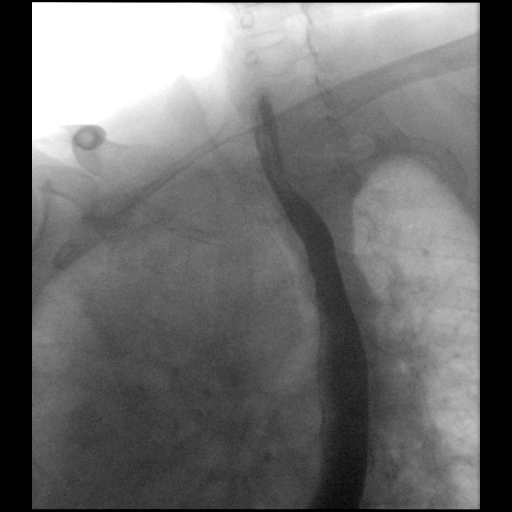
[frame 116/149]
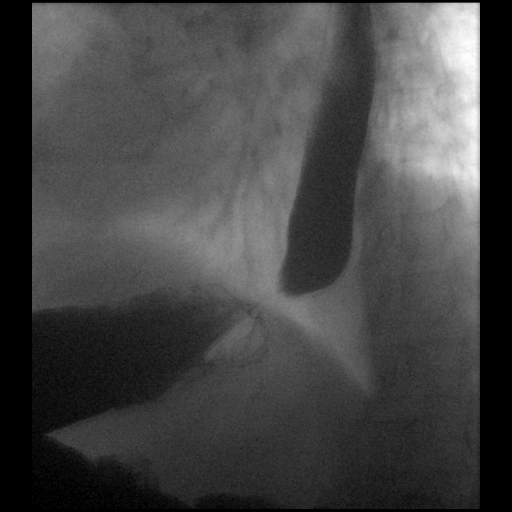
[frame 127/149]
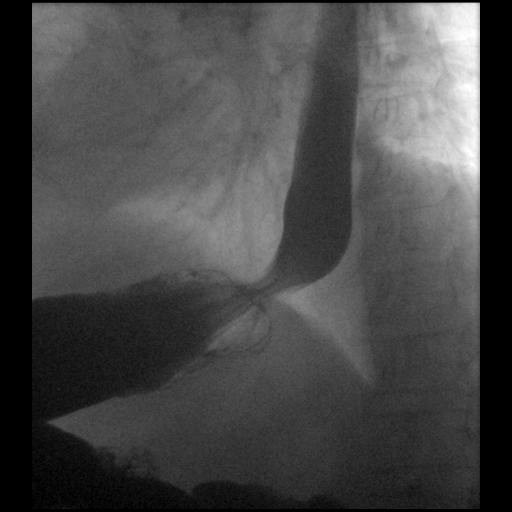

[Series 12: cp_standard · 0.19mm/px · 1 of 1 slices shown (4 of 4)]
[im 1/1]
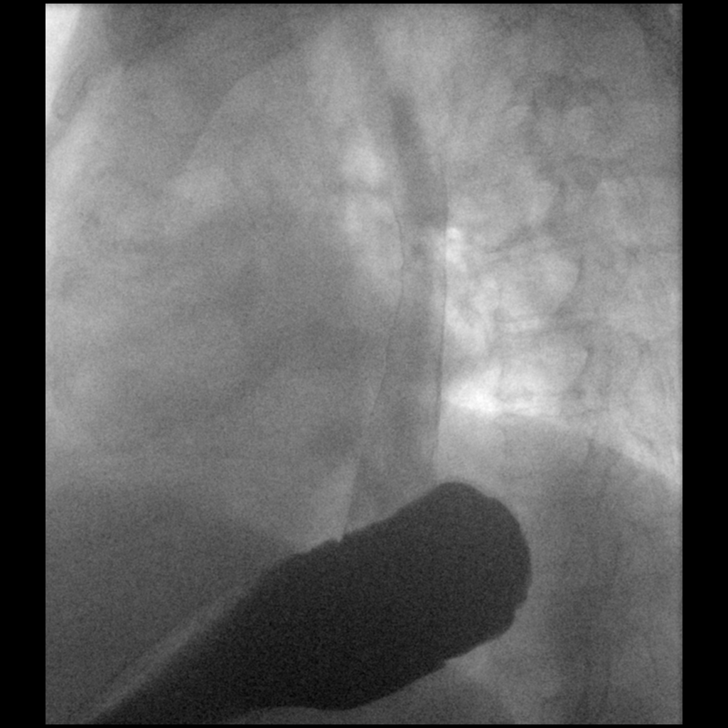

[15 of 22 positions shown; findings below may reference images not displayed]

FINDINGS: Esophageal mucosa and motility normal. Negative for stricture or
mass. No hiatal hernia. Negative for gastroesophageal reflux

Gastric mucosa normal. No gastric mass or ulcer. Normal gastric
emptying. Duodenum appears normal.
IMPRESSION: Normal upper GI.

## 2021-10-16 ENCOUNTER — Other Ambulatory Visit: Payer: Self-pay | Admitting: "Endocrinology

## 2021-11-13 ENCOUNTER — Other Ambulatory Visit: Payer: Self-pay | Admitting: "Endocrinology

## 2021-11-28 ENCOUNTER — Other Ambulatory Visit: Payer: Self-pay

## 2021-11-28 ENCOUNTER — Encounter: Payer: Self-pay | Admitting: "Endocrinology

## 2021-11-28 ENCOUNTER — Ambulatory Visit (INDEPENDENT_AMBULATORY_CARE_PROVIDER_SITE_OTHER): Payer: Medicare Other | Admitting: "Endocrinology

## 2021-11-28 VITALS — BP 126/72 | HR 92 | Ht 66.0 in | Wt 226.0 lb

## 2021-11-28 DIAGNOSIS — E1165 Type 2 diabetes mellitus with hyperglycemia: Secondary | ICD-10-CM | POA: Diagnosis not present

## 2021-11-28 DIAGNOSIS — I10 Essential (primary) hypertension: Secondary | ICD-10-CM | POA: Diagnosis not present

## 2021-11-28 DIAGNOSIS — E6609 Other obesity due to excess calories: Secondary | ICD-10-CM | POA: Diagnosis not present

## 2021-11-28 DIAGNOSIS — E782 Mixed hyperlipidemia: Secondary | ICD-10-CM | POA: Diagnosis not present

## 2021-11-28 LAB — POCT GLYCOSYLATED HEMOGLOBIN (HGB A1C): HbA1c, POC (controlled diabetic range): 9.9 % — AB (ref 0.0–7.0)

## 2021-11-28 MED ORDER — TRULICITY 3 MG/0.5ML ~~LOC~~ SOAJ: 3.0000 mg | SUBCUTANEOUS | 2 refills | Status: DC

## 2021-11-28 MED ORDER — GLIPIZIDE ER 5 MG PO TB24
5.0000 mg | ORAL_TABLET | Freq: Every day | ORAL | 0 refills | Status: DC
Start: 2021-11-28 — End: 2022-03-14

## 2021-11-28 MED ORDER — HUMULIN R U-500 KWIKPEN 500 UNIT/ML ~~LOC~~ SOPN: PEN_INJECTOR | SUBCUTANEOUS | 0 refills | Status: DC

## 2021-11-28 NOTE — Patient Instructions (Signed)

## 2021-11-28 NOTE — Progress Notes (Signed)
? ?11/28/2021 ? ? ?Endocrinology follow-up note ? ? ? ?Subjective:  ? ? Patient ID: Russell Clark, male    DOB: September 26, 1965.  ?he is being seen in follow-up for management of currently uncontrolled symptomatic type 2 diabetes, hyperlipidemia, hypertension. ?PCP:  Richarda Blade E. ? ? ?Past Medical History:  ?Diagnosis Date  ? Diabetes mellitus, type II (HCC)   ? Hyperlipidemia   ? Hypertension   ? ?Past Surgical History:  ?Procedure Laterality Date  ? OTHER SURGICAL HISTORY    ? L foot, Cataracts  ? ?Social History  ? ?Socioeconomic History  ? Marital status: Divorced  ?  Spouse name: Not on file  ? Number of children: Not on file  ? Years of education: Not on file  ? Highest education level: Not on file  ?Occupational History  ? Not on file  ?Tobacco Use  ? Smoking status: Former  ?  Packs/day: 1.00  ?  Years: 25.00  ?  Pack years: 25.00  ?  Types: Cigarettes  ? Smokeless tobacco: Never  ?Vaping Use  ? Vaping Use: Never used  ?Substance and Sexual Activity  ? Alcohol use: No  ? Drug use: No  ? Sexual activity: Not on file  ?Other Topics Concern  ? Not on file  ?Social History Narrative  ? Not on file  ? ?Social Determinants of Health  ? ?Financial Resource Strain: Not on file  ?Food Insecurity: Not on file  ?Transportation Needs: Not on file  ?Physical Activity: Not on file  ?Stress: Not on file  ?Social Connections: Not on file  ? ?Outpatient Encounter Medications as of 11/28/2021  ?Medication Sig  ? Dulaglutide (TRULICITY) 3 MG/0.5ML SOPN Inject 3 mg as directed once a week.  ? buPROPion (WELLBUTRIN SR) 150 MG 12 hr tablet Take 150 mg by mouth 2 (two) times daily.  ? Cholecalciferol (VITAMIN D PO) Take by mouth.  ? cilostazol (PLETAL) 50 MG tablet 2 (two) times daily.  ? clopidogrel (PLAVIX) 75 MG tablet Take 75 mg by mouth daily.  ? Continuous Blood Gluc Receiver (DEXCOM G6 RECEIVER) DEVI 1 Piece by Does not apply route as needed.  ? Continuous Blood Gluc Sensor (DEXCOM G6 SENSOR) MISC 4 Pieces by Does not apply  route once a week.  ? Continuous Blood Gluc Transmit (DEXCOM G6 TRANSMITTER) MISC 1 Piece by Does not apply route as directed.  ? furosemide (LASIX) 20 MG tablet Take 20 mg by mouth every morning.  ? glipiZIDE (GLUCOTROL XL) 5 MG 24 hr tablet Take 1 tablet (5 mg total) by mouth daily with breakfast.  ? Insulin Pen Needle (B-D ULTRAFINE III SHORT PEN) 31G X 8 MM MISC USE PEN NEEDLES THREE TIMES DAILY AS DIRECTED  ? insulin regular human CONCENTRATED (HUMULIN R U-500 KWIKPEN) 500 UNIT/ML KwikPen INJECT 140-160 UNITS SUBCUTANEOUSLY 3 TIMES DAILY WITH MEALS  ? Lidocaine 4 % LOTN Apply topically.  ? loperamide (IMODIUM A-D) 2 MG tablet Take 2 mg by mouth daily.   ? losartan-hydrochlorothiazide (HYZAAR) 100-12.5 MG tablet Take 1 tablet by mouth daily.  ? metFORMIN (GLUCOPHAGE) 500 MG tablet TAKE 1 TABLET BY MOUTH TWICE DAILY WITH MEALS  ? nortriptyline (PAMELOR) 25 MG capsule Take 25 mg by mouth as needed.   ? omeprazole (PRILOSEC) 40 MG capsule Take 40 mg by mouth daily.  ? pregabalin (LYRICA) 300 MG capsule Take 300 mg by mouth 2 (two) times daily.   ? pyridOXINE (VITAMIN B-6) 100 MG tablet Take 100 mg by mouth daily.  ?  rosuvastatin (CRESTOR) 20 MG tablet Take 20 mg by mouth at bedtime.  ? VITAMIN D, ERGOCALCIFEROL, PO Take by mouth.  ? [DISCONTINUED] glipiZIDE (GLUCOTROL XL) 5 MG 24 hr tablet Take 1 tablet (5 mg total) by mouth daily with breakfast.  ? [DISCONTINUED] HUMULIN R U-500 KWIKPEN 500 UNIT/ML KwikPen INJECT 140-150 UNITS SUBCUTANEOUSLY 3 TIMES DAILY WITH MEALS  ? [DISCONTINUED] TRULICITY 1.5 MG/0.5ML SOPN INJECT CONTENTS OF 1 PEN SUBCUTANEOUSLY ONCE A WEEK  ? ?No facility-administered encounter medications on file as of 11/28/2021.  ? ? ?ALLERGIES: ?Allergies  ?Allergen Reactions  ? Aspirin   ? Darvon [Propoxyphene]   ? Ibuprofen   ? Penicillins   ? ? ?VACCINATION STATUS: ? ?There is no immunization history on file for this patient. ? ?Diabetes ?He presents for his follow-up diabetic visit. He has type 2  diabetes mellitus. Onset time: He was diagnosed at approximate age of 41 years. His disease course has been improving. There are no hypoglycemic associated symptoms. Pertinent negatives for hypoglycemia include no confusion, headaches, pallor or seizures. Associated symptoms include polydipsia and polyuria. Pertinent negatives for diabetes include no blurred vision, no chest pain, no fatigue, no polyphagia and no weakness. There are no hypoglycemic complications. Symptoms are improving. Diabetic complications include peripheral neuropathy. Risk factors for coronary artery disease include dyslipidemia, diabetes mellitus, hypertension, male sex, sedentary lifestyle, tobacco exposure and obesity. Current diabetic treatment includes insulin injections and oral agent (dual therapy). He is compliant with treatment most of the time. His weight is decreasing steadily. He is following a generally unhealthy diet. When asked about meal planning, he reported none. He never participates in exercise. His home blood glucose trend is increasing steadily. His breakfast blood glucose range is generally >200 mg/dl. His lunch blood glucose range is generally >200 mg/dl. His dinner blood glucose range is generally >200 mg/dl. His bedtime blood glucose range is generally >200 mg/dl. His overall blood glucose range is >200 mg/dl. (Russell Clark presents with his Dexcom CGM.  He is analysis shows persistent severe hyperglycemia averaging 258 for the last 14 days.  He has 26% time range, 73% above range.  No hypoglycemia.  His point-of-care A1c is 9.9% slightly improving from 10.4%. ? This is despite large dose of insulin in the form of U500 and multiple other medications.  He did not document any hypoglycemia.   ? ?) An ACE inhibitor/angiotensin II receptor blocker is being taken. He sees a podiatrist.Eye exam is current (He is due for his dilated eye exam, promises to do before next visit.).  ?Hyperlipidemia ?This is a chronic problem. The  current episode started more than 1 year ago. The problem is uncontrolled. Recent lipid tests were reviewed and are high. Exacerbating diseases include diabetes and obesity. Factors aggravating his hyperlipidemia include smoking. Pertinent negatives include no chest pain, myalgias or shortness of breath. Current antihyperlipidemic treatment includes statins. Risk factors for coronary artery disease include dyslipidemia, diabetes mellitus, a sedentary lifestyle and obesity.  ?Hypertension ?This is a chronic problem. The current episode started more than 1 year ago. The problem is controlled. Pertinent negatives include no blurred vision, chest pain, headaches, neck pain, palpitations or shortness of breath. Risk factors for coronary artery disease include dyslipidemia, diabetes mellitus, male gender, sedentary lifestyle and smoking/tobacco exposure. Past treatments include angiotensin blockers.  ? ?Review of systems ? ? ? ?Objective:  ?  ?BP 126/72   Pulse 92   Ht 5\' 6"  (1.676 m)   Wt 226 lb (102.5 kg)   BMI  36.48 kg/m?   ?Wt Readings from Last 3 Encounters:  ?11/28/21 226 lb (102.5 kg)  ?08/26/21 229 lb 3.2 oz (104 kg)  ?05/22/21 235 lb (106.6 kg)  ?  ? ?Physical Exam- Limited ? ? ? ?Recent Results (from the past 2160 hour(s))  ?HgB A1c     Status: Abnormal  ? Collection Time: 11/28/21  9:06 AM  ?Result Value Ref Range  ? Hemoglobin A1C    ? HbA1c POC (<> result, manual entry)    ? HbA1c, POC (prediabetic range)    ? HbA1c, POC (controlled diabetic range) 9.9 (A) 0.0 - 7.0 %  ? ?CMP Latest Ref Rng & Units 08/15/2021 12/06/2020 05/18/2020  ?Glucose 70 - 99 mg/dL 646(O) 032(Z) -  ?BUN 6 - 24 mg/dL 13 22(Q) 82(N)  ?Creatinine 0.76 - 1.27 mg/dL 0.03 7.04 1.3  ?Sodium 134 - 144 mmol/L 138 136 -  ?Potassium 3.5 - 5.2 mmol/L 4.0 4.2 4.6  ?Chloride 96 - 106 mmol/L 96 97 -  ?CO2 20 - 29 mmol/L 27 21 -  ?Calcium 8.7 - 10.2 mg/dL 8.7 8.8 8.8(Q)  ?Total Protein 6.0 - 8.5 g/dL 6.1 9.1(Q) -  ?Total Bilirubin 0.0 - 1.2 mg/dL 0.5  0.4 -  ?Alkaline Phos 44 - 121 IU/L 129(H) 137(H) -  ?AST 0 - 40 IU/L 24 27 -  ?ALT 0 - 44 IU/L 26 34 -  ? ?Lipid Panel  ?   ?Component Value Date/Time  ? CHOL 85 (L) 08/15/2021 1029  ? TRIG 199 (H) 08/15/2021 1

## 2021-12-13 ENCOUNTER — Encounter (HOSPITAL_COMMUNITY): Payer: Self-pay | Admitting: Emergency Medicine

## 2021-12-13 ENCOUNTER — Emergency Department (HOSPITAL_COMMUNITY)
Admission: EM | Admit: 2021-12-13 | Discharge: 2021-12-13 | Disposition: A | Discharge status: Home / Self Care | Payer: Medicare Other | Attending: Emergency Medicine | Admitting: Emergency Medicine

## 2021-12-13 ENCOUNTER — Emergency Department (HOSPITAL_COMMUNITY): Payer: Medicare Other

## 2021-12-13 DIAGNOSIS — E1140 Type 2 diabetes mellitus with diabetic neuropathy, unspecified: Secondary | ICD-10-CM | POA: Diagnosis not present

## 2021-12-13 DIAGNOSIS — W19XXXA Unspecified fall, initial encounter: Secondary | ICD-10-CM | POA: Diagnosis not present

## 2021-12-13 DIAGNOSIS — I10 Essential (primary) hypertension: Secondary | ICD-10-CM | POA: Insufficient documentation

## 2021-12-13 DIAGNOSIS — S42202A Unspecified fracture of upper end of left humerus, initial encounter for closed fracture: Secondary | ICD-10-CM | POA: Diagnosis not present

## 2021-12-13 DIAGNOSIS — Z79899 Other long term (current) drug therapy: Secondary | ICD-10-CM | POA: Insufficient documentation

## 2021-12-13 DIAGNOSIS — Z7984 Long term (current) use of oral hypoglycemic drugs: Secondary | ICD-10-CM | POA: Diagnosis not present

## 2021-12-13 DIAGNOSIS — Z794 Long term (current) use of insulin: Secondary | ICD-10-CM | POA: Insufficient documentation

## 2021-12-13 DIAGNOSIS — S4992XA Unspecified injury of left shoulder and upper arm, initial encounter: Secondary | ICD-10-CM | POA: Diagnosis present

## 2021-12-13 DIAGNOSIS — S42292A Other displaced fracture of upper end of left humerus, initial encounter for closed fracture: Secondary | ICD-10-CM

## 2021-12-13 LAB — COMPREHENSIVE METABOLIC PANEL
ALT: 17 U/L (ref 0–44)
AST: 14 U/L — ABNORMAL LOW (ref 15–41)
Albumin: 3.5 g/dL (ref 3.5–5.0)
Alkaline Phosphatase: 118 U/L (ref 38–126)
Anion gap: 9 (ref 5–15)
BUN: 19 mg/dL (ref 6–20)
CO2: 26 mmol/L (ref 22–32)
Calcium: 8.9 mg/dL (ref 8.9–10.3)
Chloride: 104 mmol/L (ref 98–111)
Creatinine, Ser: 1.21 mg/dL (ref 0.61–1.24)
GFR, Estimated: >60 mL/min (ref >60)
Glucose, Bld: 199 mg/dL — ABNORMAL HIGH (ref 70–99)
Potassium: 3.3 mmol/L — ABNORMAL LOW (ref 3.5–5.1)
Sodium: 139 mmol/L (ref 135–145)
Total Bilirubin: 0.9 mg/dL (ref 0.3–1.2)
Total Protein: 6.5 g/dL (ref 6.5–8.1)

## 2021-12-13 LAB — CBC WITH DIFFERENTIAL/PLATELET
Abs Immature Granulocytes: 0.05 10*3/uL (ref 0.00–0.07)
Basophils Absolute: 0 10*3/uL (ref 0.0–0.1)
Basophils Relative: 0 %
Eosinophils Absolute: 0 10*3/uL (ref 0.0–0.5)
Eosinophils Relative: 0 %
HCT: 40.1 % (ref 39.0–52.0)
Hemoglobin: 13.4 g/dL (ref 13.0–17.0)
Immature Granulocytes: 0 %
Lymphocytes Relative: 15 %
Lymphs Abs: 1.7 10*3/uL (ref 0.7–4.0)
MCH: 27.1 pg (ref 26.0–34.0)
MCHC: 33.4 g/dL (ref 30.0–36.0)
MCV: 81 fL (ref 80.0–100.0)
Monocytes Absolute: 0.9 10*3/uL (ref 0.1–1.0)
Monocytes Relative: 8 %
Neutro Abs: 8.6 10*3/uL — ABNORMAL HIGH (ref 1.7–7.7)
Neutrophils Relative %: 77 %
Platelets: 231 10*3/uL (ref 150–400)
RBC: 4.95 MIL/uL (ref 4.22–5.81)
RDW: 13.8 % (ref 11.5–15.5)
WBC: 11.3 10*3/uL — ABNORMAL HIGH (ref 4.0–10.5)
nRBC: 0 % (ref 0.0–0.2)

## 2021-12-13 MED ORDER — OXYCODONE-ACETAMINOPHEN 5-325 MG PO TABS
1.0000 | ORAL_TABLET | Freq: Once | ORAL | Status: AC
  Administered 2021-12-13: 1 via ORAL
  Filled 2021-12-13: qty 1

## 2021-12-13 MED ORDER — OXYCODONE-ACETAMINOPHEN 5-325 MG PO TABS: 1.0000 | ORAL_TABLET | Freq: Four times a day (QID) | ORAL | 0 refills | Status: DC | PRN

## 2021-12-13 NOTE — ED Provider Notes (Signed)
?[facility] EMERGENCY DEPARTMENT ?Provider Note ? ? ?CSN: [id number] ?Arrival date & time: 12/13/21  1346 ? ?  ? ?History ? ?Chief Complaint  ?Patient presents with  ? Fall  ? ? ?Russell Clark is a 56 y.o. male. ? ?Russell Clark is a 56 y.o. male with hx of HTN, HLD, DM, GERD and neuropathy, who presents for evaluation of shoulder injury after a fall. Pt reports that the fall occurred 5 days ago on Sunday and he was initially seen at an outside hospital in [place]  and was told he has a left shoulder fracture. The orthopedist he was referred to told him he would need to go to a different orthopedist in [place] or [place] to have this repaired and he has not heard back from anyone regarding referrals and in the mean time is have worsening pain and bruising in the left shoulder. He has been wearing the sling he was provided. Reports he was prescribed Norco which initially helped with pain, but it has since run out. He reports he has a hx of issues with falls due to instability from his diabetic neuropathy. No other injuries from the fall, and no other reported findings from his work up at the outside hospital. ? ?The history is provided by the patient, medical records and a relative.  ? ?  ? ?Home Medications ?Prior to Admission medications   ?Medication Sig Start Date End Date Taking? Authorizing Provider  ?buPROPion (WELLBUTRIN SR) 150 MG 12 hr tablet Take 150 mg by mouth 2 (two) times daily.    [provider]  ?Cholecalciferol (VITAMIN D PO) Take by mouth.    [provider]  ?cilostazol (PLETAL) 50 MG tablet 2 (two) times daily. 05/16/20   [provider]  ?clopidogrel (PLAVIX) 75 MG tablet Take 75 mg by mouth daily.    [provider]  ?Continuous Blood Gluc Receiver (DEXCOM G6 RECEIVER) DEVI 1 Piece by Does not apply route as needed. 06/14/20   Roma Kayser, MD  ?Continuous Blood Gluc Sensor (DEXCOM G6 SENSOR) MISC 4 Pieces by Does not apply route  once a week. 06/14/20   Roma Kayser, MD  ?Continuous Blood Gluc Transmit (DEXCOM G6 TRANSMITTER) MISC 1 Piece by Does not apply route as directed. 06/14/20   Roma Kayser, MD  ?Dulaglutide (TRULICITY) 3 MG/0.5ML SOPN Inject 3 mg as directed once a week. 11/28/21   Roma Kayser, MD  ?furosemide (LASIX) 20 MG tablet Take 20 mg by mouth every morning. 04/17/20   [provider]  ?glipiZIDE (GLUCOTROL XL) 5 MG 24 hr tablet Take 1 tablet (5 mg total) by mouth daily with breakfast. 11/28/21   Nida, Denman George, MD  ?Insulin Pen Needle (B-D ULTRAFINE III SHORT PEN) 31G X 8 MM MISC USE PEN NEEDLES THREE TIMES DAILY AS DIRECTED 04/09/21   Nida, Denman George, MD  ?insulin regular human CONCENTRATED (HUMULIN R U-500 KWIKPEN) 500 UNIT/ML KwikPen INJECT 140-160 UNITS SUBCUTANEOUSLY 3 TIMES DAILY WITH MEALS 11/28/21   Roma Kayser, MD  ?Lidocaine 4 % LOTN Apply topically.    [provider]  ?loperamide (IMODIUM A-D) 2 MG tablet Take 2 mg by mouth daily.     [provider]  ?losartan-hydrochlorothiazide (HYZAAR) 100-12.5 MG tablet Take 1 tablet by mouth daily.    [provider]  ?metFORMIN (GLUCOPHAGE) 500 MG tablet TAKE 1 TABLET BY MOUTH TWICE DAILY WITH MEALS 11/14/21   Nida, Denman George, MD  ?nortriptyline [facility])  25 MG capsule Take 25 mg by mouth as needed.     [provider]  ?omeprazole (PRILOSEC) 40 MG capsule Take 40 mg by mouth daily.    [provider]  ?pregabalin (LYRICA) 300 MG capsule Take 300 mg by mouth 2 (two) times daily.     [provider]  ?pyridOXINE (VITAMIN B-6) 100 MG tablet Take 100 mg by mouth daily.    [provider]  ?rosuvastatin (CRESTOR) 20 MG tablet Take 20 mg by mouth at bedtime. 10/25/20   [provider]  ?VITAMIN D, ERGOCALCIFEROL, PO Take by mouth.    [provider]  ?   ? ?Allergies    ?Aspirin, Darvon [propoxyphene], Ibuprofen, and Penicillins   ? ?Review of  Systems   ?Review of Systems  ?Constitutional:  Negative for chills.  ?Musculoskeletal:  Positive for arthralgias. Negative for joint swelling.  ?Skin:  Positive for color change.  ?Neurological:  Negative for weakness, numbness and headaches.  ? ?Physical Exam ?Updated Vital Signs ?BP 110/78   Pulse (!) 110   Temp 98.6 ?F (37 ?C) (Oral)   Resp 14   SpO2 93%  ?Physical Exam ?Vitals and nursing note reviewed.  ?Constitutional:   ?   General: He is not in acute distress. ?   Appearance: Normal appearance. He is well-developed. He is not ill-appearing or diaphoretic.  ?HENT:  ?   Head: Normocephalic and atraumatic.  ?Eyes:  ?   General:     ?   Right eye: No discharge.     ?   Left eye: No discharge.  ?Cardiovascular:  ?   Rate and Rhythm: Normal rate and regular rhythm.  ?   Pulses: Normal pulses.  ?Pulmonary:  ?   Effort: Pulmonary effort is normal. No respiratory distress.  ?   Breath sounds: Normal breath sounds.  ?Abdominal:  ?   Tenderness: There is no abdominal tenderness.  ?Musculoskeletal:  ?   Cervical back: Neck supple.  ?   Comments: Left shoulder in sling, tenderness and swelling over the glenohumeral join with bruising extending down the from of the upper arm and onto the left chest. ROM limited due to pain. Distal pulses intact and normal sensation.  ?Skin: ?   General: Skin is warm and dry.  ?Neurological:  ?   Mental Status: He is alert and oriented to person, place, and time.  ?   Coordination: Coordination normal.  ?Psychiatric:     ?   Mood and Affect: Mood normal.     ?   Behavior: Behavior normal.  ? ? ?ED Results / Procedures / Treatments   ?Labs ?(all labs ordered are listed, but only abnormal results are displayed) ?Labs Reviewed  ?COMPREHENSIVE METABOLIC PANEL - Abnormal; Notable for the following components:  ?    Result Value  ? Potassium 3.3 (*)   ? Glucose, Bld 199 (*)   ? AST 14 (*)   ? All other components within normal limits  ?CBC WITH DIFFERENTIAL/PLATELET - Abnormal; Notable for  the following components:  ? WBC 11.3 (*)   ? Neutro Abs 8.6 (*)   ? All other components within normal limits  ? ? ?EKG ?None ? ?Radiology ?DG Shoulder Left ? ?Result Date: 12/13/2021 ?CLINICAL DATA:  Fall. EXAM: LEFT SHOULDER - 2+ VIEW; LEFT HUMERUS - 2+ VIEW COMPARISON:  None. FINDINGS: Comminuted impacted fracture of the humeral head with inferior subluxation relative to the glenoid. No other acute fracture. Soft tissues are unremarkable.  IMPRESSION: Comminuted impacted fracture of the humeral head with inferior subluxation relative to the glenoid. Electronically Signed   By: Sherron AlesLaura  Parra M.D.   On: 12/13/2021 14:23  ? ?DG Humerus Left ? ?Result Date: 12/13/2021 ?CLINICAL DATA:  Fall. EXAM: LEFT SHOULDER - 2+ VIEW; LEFT HUMERUS - 2+ VIEW COMPARISON:  None. FINDINGS: Comminuted impacted fracture of the humeral head with inferior subluxation relative to the glenoid. No other acute fracture. Soft tissues are unremarkable. IMPRESSION: Comminuted impacted fracture of the humeral head with inferior subluxation relative to the glenoid. Electronically Signed   By: Sherron AlesLaura  Parra M.D.   On: 12/13/2021 14:23   ? ?Procedures ?Procedures  ? ? ?Medications Ordered in ED ?Medications  ?oxyCODONE-acetaminophen (PERCOCET/ROXICET) 5-325 MG per tablet 1 tablet (1 tablet Oral Given 12/13/21 1711)  ? ? ?ED Course/ Medical Decision Making/ A&P ?  ?                        ?This patient presents to the ED for concern of fall with left shoulder fracture, this involves an extensive number of treatment options, and is a complaint that carries with it a high risk of complications and morbidity.  The differential diagnosis includes fracture, dislocation, subluxation, soft tissue injury ? ? ?Co morbidities that complicate the patient evaluation ? ?HTN, DM, HLD ? ? ?Additional history obtained: ? ?Additional history obtained from relative at bedside ?External records from outside source obtained and reviewed including outpatient PCP notes, unable  to review notes or imaging from ED encounter at outside hospital, as they are not present in care everywhere ? ? ?Lab Tests: ? ?I Ordered, and personally interpreted labs.  The pertinent results include:

## 2021-12-13 NOTE — Discharge Instructions (Addendum)
Use prescribed pain medication as needed for severe pain, do not drive while taking this medication as it can cause drowsiness.  Remain in sling until you follow-up with orthopedics. ? ?Please call Dr. Veda Canning office first thing Monday morning to set up a follow-up appointment for Monday ?

## 2021-12-13 NOTE — ED Triage Notes (Signed)
Patient here after for evaluation of left shoulder fracture that occurred after a fall on Sunday. Patient fell due to numbness related to diabetic neuropathy. Was seen on Sunday by a hospital in [place] but was told they were unable to help him. Patient arrives with left arm in sling. ?

## 2021-12-13 NOTE — ED Notes (Signed)
Patient transported to CT 

## 2021-12-16 ENCOUNTER — Encounter (HOSPITAL_COMMUNITY): Payer: Self-pay | Admitting: Orthopedic Surgery

## 2021-12-16 ENCOUNTER — Other Ambulatory Visit: Payer: Self-pay

## 2021-12-16 ENCOUNTER — Other Ambulatory Visit: Payer: Self-pay | Admitting: Orthopedic Surgery

## 2021-12-16 NOTE — Progress Notes (Addendum)
COVID swab appointment: N/A ? ?COVID Vaccine Completed:  Yes x1 ?Date COVID Vaccine completed: ?Has received booster: ?COVID vaccine manufacturer:     [employer]  ? ?Date of COVID positive in last 90 days:  No ? ?PCP - Richarda Blade ?Cardiologist - Loyal Gambler, MD (for preop eval, no cardiac issues) ? ?Chest x-ray - N/A ?EKG - N/A ?Stress Test - greater than 2 years ?ECHO - N/A ?Cardiac Cath - N/A ?Pacemaker/ICD device last checked: ?Spinal Cord Stimulator: ? ?Bowel Prep - N/A ? ?Sleep Study - Yes, +sleep apnea ?CPAP - Yes ? ?Fasting Blood Sugar - 200 to 300 ?Checks Blood Sugar - Dexcom 6 (R abdomen) multiple times a day ? ?Blood Thinner Instructions:  Plavix last dose 12-09-21.  Takes for poor circulation ?Aspirin Instructions:   ?Last Dose: ? ?Activity level:   Can go up a flight of stairs and perform activities of daily living without stopping and without symptoms of chest pain or shortness of breath.   ? ?Anesthesia review:  HTN, DM. OSA with CPAP, venous insufficiency ? ?Patient denies shortness of breath, fever, cough and chest pain at PAT appointment (completed over the phone) ? ?Patient verbalized understanding of instructions that were given to them at the PAT appointment. Patient was also instructed that they will need to review over the PAT instructions again at home before surgery.  ?

## 2021-12-17 ENCOUNTER — Encounter (HOSPITAL_COMMUNITY)
Admission: RE | Disposition: A | Discharge status: Home / Self Care | Payer: Self-pay | Source: Home / Self Care | Attending: Orthopedic Surgery

## 2021-12-17 ENCOUNTER — Ambulatory Visit (HOSPITAL_BASED_OUTPATIENT_CLINIC_OR_DEPARTMENT_OTHER): Payer: Medicare Other | Admitting: Physician Assistant

## 2021-12-17 ENCOUNTER — Ambulatory Visit (HOSPITAL_COMMUNITY): Payer: Medicare Other

## 2021-12-17 ENCOUNTER — Ambulatory Visit (HOSPITAL_COMMUNITY)
Admission: RE | Admit: 2021-12-17 | Discharge: 2021-12-17 | Disposition: A | Discharge status: Home / Self Care | Payer: Medicare Other | Attending: Orthopedic Surgery | Admitting: Orthopedic Surgery

## 2021-12-17 ENCOUNTER — Ambulatory Visit (HOSPITAL_COMMUNITY): Payer: Medicare Other | Admitting: Physician Assistant

## 2021-12-17 ENCOUNTER — Encounter (HOSPITAL_COMMUNITY): Payer: Self-pay | Admitting: Orthopedic Surgery

## 2021-12-17 DIAGNOSIS — Z79899 Other long term (current) drug therapy: Secondary | ICD-10-CM | POA: Diagnosis not present

## 2021-12-17 DIAGNOSIS — Z01818 Encounter for other preprocedural examination: Secondary | ICD-10-CM

## 2021-12-17 DIAGNOSIS — E119 Type 2 diabetes mellitus without complications: Secondary | ICD-10-CM | POA: Diagnosis not present

## 2021-12-17 DIAGNOSIS — K219 Gastro-esophageal reflux disease without esophagitis: Secondary | ICD-10-CM | POA: Diagnosis not present

## 2021-12-17 DIAGNOSIS — Z6836 Body mass index (BMI) 36.0-36.9, adult: Secondary | ICD-10-CM | POA: Diagnosis not present

## 2021-12-17 DIAGNOSIS — Z7985 Long-term (current) use of injectable non-insulin antidiabetic drugs: Secondary | ICD-10-CM | POA: Diagnosis not present

## 2021-12-17 DIAGNOSIS — G4730 Sleep apnea, unspecified: Secondary | ICD-10-CM | POA: Diagnosis not present

## 2021-12-17 DIAGNOSIS — S42352A Displaced comminuted fracture of shaft of humerus, left arm, initial encounter for closed fracture: Secondary | ICD-10-CM

## 2021-12-17 DIAGNOSIS — W19XXXA Unspecified fall, initial encounter: Secondary | ICD-10-CM | POA: Diagnosis not present

## 2021-12-17 DIAGNOSIS — Z87891 Personal history of nicotine dependence: Secondary | ICD-10-CM | POA: Diagnosis not present

## 2021-12-17 DIAGNOSIS — I10 Essential (primary) hypertension: Secondary | ICD-10-CM | POA: Diagnosis not present

## 2021-12-17 DIAGNOSIS — E669 Obesity, unspecified: Secondary | ICD-10-CM | POA: Diagnosis not present

## 2021-12-17 DIAGNOSIS — E1140 Type 2 diabetes mellitus with diabetic neuropathy, unspecified: Secondary | ICD-10-CM | POA: Diagnosis not present

## 2021-12-17 DIAGNOSIS — Z7984 Long term (current) use of oral hypoglycemic drugs: Secondary | ICD-10-CM | POA: Diagnosis not present

## 2021-12-17 DIAGNOSIS — Z794 Long term (current) use of insulin: Secondary | ICD-10-CM

## 2021-12-17 DIAGNOSIS — E1151 Type 2 diabetes mellitus with diabetic peripheral angiopathy without gangrene: Secondary | ICD-10-CM

## 2021-12-17 DIAGNOSIS — S42202A Unspecified fracture of upper end of left humerus, initial encounter for closed fracture: Secondary | ICD-10-CM | POA: Insufficient documentation

## 2021-12-17 DIAGNOSIS — E785 Hyperlipidemia, unspecified: Secondary | ICD-10-CM | POA: Diagnosis not present

## 2021-12-17 HISTORY — DX: Venous insufficiency (chronic) (peripheral): I87.2

## 2021-12-17 HISTORY — DX: Gastro-esophageal reflux disease without esophagitis: K21.9

## 2021-12-17 HISTORY — PX: REVERSE SHOULDER ARTHROPLASTY: SHX5054

## 2021-12-17 HISTORY — DX: Sleep apnea, unspecified: G47.30

## 2021-12-17 HISTORY — DX: Polyneuropathy, unspecified: G62.9

## 2021-12-17 LAB — TYPE AND SCREEN
ABO/RH(D): O NEG
Antibody Screen: NEGATIVE

## 2021-12-17 LAB — GLUCOSE, CAPILLARY
Glucose-Capillary: 402 mg/dL — ABNORMAL HIGH (ref 70–99)
Glucose-Capillary: 333 mg/dL — ABNORMAL HIGH (ref 70–99)

## 2021-12-17 LAB — ABO/RH: ABO/RH(D): O NEG

## 2021-12-17 SURGERY — ARTHROPLASTY, SHOULDER, TOTAL, REVERSE
Anesthesia: General | Site: Shoulder | Laterality: Left

## 2021-12-17 MED ORDER — METHOCARBAMOL 500 MG PO TABS
500.0000 mg | ORAL_TABLET | Freq: Three times a day (TID) | ORAL | 0 refills | Status: DC | PRN
Start: 2021-12-17 — End: 2022-03-05

## 2021-12-17 MED ORDER — DEXMEDETOMIDINE (PRECEDEX) IN NS 20 MCG/5ML (4 MCG/ML) IV SYRINGE
PREFILLED_SYRINGE | INTRAVENOUS | Status: AC
  Filled 2021-12-17: qty 5

## 2021-12-17 MED ORDER — METHOCARBAMOL 500 MG IVPB - SIMPLE MED
500.0000 mg | Freq: Four times a day (QID) | INTRAVENOUS | Status: DC | PRN
Start: 2021-12-17 — End: 2021-12-17

## 2021-12-17 MED ORDER — BUPIVACAINE LIPOSOME 1.3 % IJ SUSP
INTRAMUSCULAR | Status: DC | PRN
  Administered 2021-12-17: 10 mL via PERINEURAL

## 2021-12-17 MED ORDER — PHENYLEPHRINE 40 MCG/ML (10ML) SYRINGE FOR IV PUSH (FOR BLOOD PRESSURE SUPPORT)
PREFILLED_SYRINGE | INTRAVENOUS | Status: DC | PRN
  Administered 2021-12-17: 80 ug via INTRAVENOUS

## 2021-12-17 MED ORDER — BUPIVACAINE HCL (PF) 0.5 % IJ SOLN
INTRAMUSCULAR | Status: DC | PRN
  Administered 2021-12-17: 15 mL via PERINEURAL

## 2021-12-17 MED ORDER — PROPOFOL 10 MG/ML IV BOLUS
INTRAVENOUS | Status: AC
  Filled 2021-12-17: qty 20

## 2021-12-17 MED ORDER — LIDOCAINE 2% (20 MG/ML) 5 ML SYRINGE
INTRAMUSCULAR | Status: DC | PRN
  Administered 2021-12-17: 60 mg via INTRAVENOUS

## 2021-12-17 MED ORDER — FENTANYL CITRATE PF 50 MCG/ML IJ SOSY: 25.0000 ug | PREFILLED_SYRINGE | INTRAMUSCULAR | Status: DC | PRN

## 2021-12-17 MED ORDER — FENTANYL CITRATE PF 50 MCG/ML IJ SOSY
PREFILLED_SYRINGE | INTRAMUSCULAR | Status: AC
  Administered 2021-12-17: 50 ug via INTRAVENOUS
  Filled 2021-12-17: qty 1

## 2021-12-17 MED ORDER — FENTANYL CITRATE PF 50 MCG/ML IJ SOSY
50.0000 ug | PREFILLED_SYRINGE | INTRAMUSCULAR | Status: DC
  Administered 2021-12-17: 50 ug via INTRAVENOUS
  Filled 2021-12-17: qty 2

## 2021-12-17 MED ORDER — METHOCARBAMOL 500 MG PO TABS: 500.0000 mg | ORAL_TABLET | Freq: Four times a day (QID) | ORAL | Status: DC | PRN

## 2021-12-17 MED ORDER — TRANEXAMIC ACID-NACL 1000-0.7 MG/100ML-% IV SOLN
INTRAVENOUS | Status: AC
  Filled 2021-12-17: qty 100

## 2021-12-17 MED ORDER — DEXAMETHASONE SODIUM PHOSPHATE 10 MG/ML IJ SOLN
INTRAMUSCULAR | Status: AC
  Filled 2021-12-17: qty 1

## 2021-12-17 MED ORDER — VANCOMYCIN HCL IN DEXTROSE 1-5 GM/200ML-% IV SOLN
INTRAVENOUS | Status: AC
  Administered 2021-12-17: 1000 mg
  Filled 2021-12-17: qty 200

## 2021-12-17 MED ORDER — ONDANSETRON HCL 4 MG/2ML IJ SOLN
INTRAMUSCULAR | Status: AC
  Filled 2021-12-17: qty 2

## 2021-12-17 MED ORDER — INSULIN ASPART 100 UNIT/ML IJ SOLN
INTRAMUSCULAR | Status: AC
  Filled 2021-12-17: qty 1

## 2021-12-17 MED ORDER — FENTANYL CITRATE (PF) 100 MCG/2ML IJ SOLN
INTRAMUSCULAR | Status: AC
Start: 2021-12-17 — End: ?
  Filled 2021-12-17: qty 2

## 2021-12-17 MED ORDER — OXYCODONE-ACETAMINOPHEN 5-325 MG PO TABS: 1.0000 | ORAL_TABLET | Freq: Four times a day (QID) | ORAL | 0 refills | Status: DC | PRN

## 2021-12-17 MED ORDER — ONDANSETRON HCL 4 MG/2ML IJ SOLN
INTRAMUSCULAR | Status: DC | PRN
  Administered 2021-12-17: 4 mg via INTRAVENOUS

## 2021-12-17 MED ORDER — INSULIN ASPART 100 UNIT/ML IJ SOLN
12.0000 [IU] | Freq: Once | INTRAMUSCULAR | Status: AC
  Administered 2021-12-17: 12 [IU] via SUBCUTANEOUS

## 2021-12-17 MED ORDER — NON FORMULARY: 12.0000 [IU] | Freq: Once | Status: DC

## 2021-12-17 MED ORDER — SODIUM CHLORIDE 0.9 % IR SOLN
Status: DC | PRN
  Administered 2021-12-17: 1000 mL

## 2021-12-17 MED ORDER — WATER FOR IRRIGATION, STERILE IR SOLN
Status: DC | PRN
Start: 2021-12-17 — End: 2021-12-17
  Administered 2021-12-17: 2000 mL

## 2021-12-17 MED ORDER — LACTATED RINGERS IV SOLN: INTRAVENOUS | Status: DC

## 2021-12-17 MED ORDER — 0.9 % SODIUM CHLORIDE (POUR BTL) OPTIME
TOPICAL | Status: DC | PRN
  Administered 2021-12-17: 1000 mL

## 2021-12-17 MED ORDER — DEXAMETHASONE SODIUM PHOSPHATE 10 MG/ML IJ SOLN
INTRAMUSCULAR | Status: DC | PRN
  Administered 2021-12-17: 10 mg via INTRAVENOUS

## 2021-12-17 MED ORDER — PROPOFOL 10 MG/ML IV BOLUS
INTRAVENOUS | Status: DC | PRN
  Administered 2021-12-17: 200 mg via INTRAVENOUS

## 2021-12-17 MED ORDER — ROCURONIUM BROMIDE 10 MG/ML (PF) SYRINGE
PREFILLED_SYRINGE | INTRAVENOUS | Status: DC | PRN
  Administered 2021-12-17: 70 mg via INTRAVENOUS

## 2021-12-17 MED ORDER — ONDANSETRON HCL 4 MG/2ML IJ SOLN: 4.0000 mg | Freq: Once | INTRAMUSCULAR | Status: DC | PRN

## 2021-12-17 MED ORDER — FENTANYL CITRATE (PF) 100 MCG/2ML IJ SOLN
INTRAMUSCULAR | Status: DC | PRN
Start: 2021-12-17 — End: 2021-12-17
  Administered 2021-12-17 (×2): 50 ug via INTRAVENOUS

## 2021-12-17 MED ORDER — ORAL CARE MOUTH RINSE
15.0000 mL | Freq: Once | OROMUCOSAL | Status: AC
  Administered 2021-12-17: 15 mL via OROMUCOSAL

## 2021-12-17 MED ORDER — MIDAZOLAM HCL 2 MG/2ML IJ SOLN
1.0000 mg | INTRAMUSCULAR | Status: DC
  Administered 2021-12-17: 1 mg via INTRAVENOUS
  Filled 2021-12-17: qty 2

## 2021-12-17 MED ORDER — TRANEXAMIC ACID-NACL 1000-0.7 MG/100ML-% IV SOLN
INTRAVENOUS | Status: DC | PRN
  Administered 2021-12-17: 1000 mg via INTRAVENOUS

## 2021-12-17 MED ORDER — CHLORHEXIDINE GLUCONATE 0.12 % MT SOLN: 15.0000 mL | Freq: Once | OROMUCOSAL | Status: AC

## 2021-12-17 MED ORDER — SUGAMMADEX SODIUM 200 MG/2ML IV SOLN
INTRAVENOUS | Status: DC | PRN
  Administered 2021-12-17: 200 mg via INTRAVENOUS

## 2021-12-17 MED ORDER — PHENYLEPHRINE HCL-NACL 20-0.9 MG/250ML-% IV SOLN
INTRAVENOUS | Status: DC | PRN
  Administered 2021-12-17: 50 ug/min via INTRAVENOUS

## 2021-12-17 MED ORDER — METHOCARBAMOL 500 MG IVPB - SIMPLE MED
INTRAVENOUS | Status: AC
Start: 2021-12-17 — End: 2021-12-17
  Administered 2021-12-17: 500 mg via INTRAVENOUS
  Filled 2021-12-17: qty 50

## 2021-12-17 SURGICAL SUPPLY — 86 items
AID PSTN UNV HD RSTRNT DISP (MISCELLANEOUS) ×1
BAG COUNTER SPONGE SURGICOUNT (BAG) ×1 IMPLANT
BAG SPEC THK2 15X12 ZIP CLS (MISCELLANEOUS) ×1
BAG SPNG CNTER NS LX DISP (BAG) ×1
BAG ZIPLOCK 12X15 (MISCELLANEOUS) ×3 IMPLANT
BASEPLATE P2 COATD GLND 6.5X30 (Shoulder) IMPLANT
BIT DRILL 1.6MX128 (BIT) IMPLANT
BIT DRILL 2.5 DIA 127 CALI (BIT) ×1 IMPLANT
BIT DRILL 4 DIA CALIBRATED (BIT) ×1 IMPLANT
BLADE SAW SAG 73X25 THK (BLADE) ×1
BLADE SAW SGTL 73X25 THK (BLADE) ×2 IMPLANT
BOOTIES KNEE HIGH SLOAN (MISCELLANEOUS) ×6 IMPLANT
BSPLAT GLND 30 STRL LF SHLDR (Shoulder) ×1 IMPLANT
COOLER ICEMAN CLASSIC (MISCELLANEOUS) ×1 IMPLANT
COVER BACK TABLE 60X90IN (DRAPES) ×3 IMPLANT
COVER SURGICAL LIGHT HANDLE (MISCELLANEOUS) ×3 IMPLANT
DRAPE INCISE IOBAN 66X45 STRL (DRAPES) ×3 IMPLANT
DRAPE ORTHO SPLIT 77X108 STRL (DRAPES) ×4
DRAPE POUCH INSTRU U-SHP 10X18 (DRAPES) ×3 IMPLANT
DRAPE SHEET LG 3/4 BI-LAMINATE (DRAPES) ×3 IMPLANT
DRAPE SURG 17X11 SM STRL (DRAPES) ×3 IMPLANT
DRAPE SURG ORHT 6 SPLT 77X108 (DRAPES) ×4 IMPLANT
DRAPE TOP 10253 STERILE (DRAPES) ×3 IMPLANT
DRAPE U-SHAPE 47X51 STRL (DRAPES) ×3 IMPLANT
DRESSING AQUACEL AG SP 3.5X6 (GAUZE/BANDAGES/DRESSINGS) IMPLANT
DRSG AQUACEL AG ADV 3.5X 6 (GAUZE/BANDAGES/DRESSINGS) ×3 IMPLANT
DRSG AQUACEL AG SP 3.5X6 (GAUZE/BANDAGES/DRESSINGS) ×2
DURAPREP 26ML APPLICATOR (WOUND CARE) ×6 IMPLANT
ELECT BLADE TIP CTD 4 INCH (ELECTRODE) ×3 IMPLANT
ELECT REM PT RETURN 15FT ADLT (MISCELLANEOUS) ×3 IMPLANT
GLOVE SRG 8 PF TXTR STRL LF DI (GLOVE) ×2 IMPLANT
GLOVE SURG ENC MOIS LTX SZ7.5 (GLOVE) ×3 IMPLANT
GLOVE SURG POLYISO LF SZ6.5 (GLOVE) ×3 IMPLANT
GLOVE SURG UNDER POLY LF SZ6.5 (GLOVE) ×3 IMPLANT
GLOVE SURG UNDER POLY LF SZ8 (GLOVE) ×2
GOWN STRL REUS W/ TWL XL LVL3 (GOWN DISPOSABLE) ×2 IMPLANT
GOWN STRL REUS W/TWL XL LVL3 (GOWN DISPOSABLE) ×2
HANDPIECE INTERPULSE COAX TIP (DISPOSABLE) ×2
HOOD PEEL AWAY FLYTE STAYCOOL (MISCELLANEOUS) ×9 IMPLANT
INSERT EPOLY STND HUMERUS 32MM (Shoulder) ×2 IMPLANT
INSERT EPOLYSTD HUMERUS 32MM (Shoulder) IMPLANT
KIT BASIN OR (CUSTOM PROCEDURE TRAY) ×3 IMPLANT
KIT TURNOVER KIT A (KITS) ×1 IMPLANT
MANIFOLD NEPTUNE II (INSTRUMENTS) ×3 IMPLANT
NDL MA TROC 1/2 CIR (NEEDLE) IMPLANT
NDL TROCAR POINT SZ 2 1/2 (NEEDLE) IMPLANT
NEEDLE MA TROC 1/2 CIR (NEEDLE) ×2 IMPLANT
NEEDLE TROCAR POINT SZ 2 1/2 (NEEDLE) IMPLANT
NS IRRIG 1000ML POUR BTL (IV SOLUTION) ×3 IMPLANT
P2 COATDE GLNOID BSEPLT 6.5X30 (Shoulder) ×2 IMPLANT
PACK SHOULDER (CUSTOM PROCEDURE TRAY) ×3 IMPLANT
PAD COLD SHLDR WRAP-ON (PAD) IMPLANT
PROTECTOR NERVE ULNAR (MISCELLANEOUS) IMPLANT
RESTRAINT HEAD UNIVERSAL NS (MISCELLANEOUS) ×1 IMPLANT
RETRIEVER SUT HEWSON (MISCELLANEOUS) IMPLANT
SCREW BONE LOCKING RSP 5.0X30 (Screw) ×2 IMPLANT
SCREW BONE LOCKING RSP 5.0X34 (Screw) ×2 IMPLANT
SCREW BONE RSP LOCK 5X18 (Screw) IMPLANT
SCREW BONE RSP LOCK 5X30 (Screw) IMPLANT
SCREW BONE RSP LOCK 5X34 (Screw) IMPLANT
SCREW BONE RSP LOCKING 18MM LG (Screw) ×4 IMPLANT
SCREW RETAIN W/HEAD 32MM (Shoulder) ×1 IMPLANT
SET HNDPC FAN SPRY TIP SCT (DISPOSABLE) ×2 IMPLANT
SLING ARM FOAM STRAP LRG (SOFTGOODS) ×1 IMPLANT
SLING ARM IMMOBILIZER LRG (SOFTGOODS) IMPLANT
SLING ARM IMMOBILIZER MED (SOFTGOODS) ×1 IMPLANT
SPONGE T-LAP 18X18 ~~LOC~~+RFID (SPONGE) ×3 IMPLANT
SPONGE T-LAP 4X18 ~~LOC~~+RFID (SPONGE) ×3 IMPLANT
STEM REV PRIMARY 12X108 (Shoulder) ×1 IMPLANT
STRIP CLOSURE SKIN 1/2X4 (GAUZE/BANDAGES/DRESSINGS) ×6 IMPLANT
SUCTION FRAZIER HANDLE 10FR (MISCELLANEOUS) ×2
SUCTION TUBE FRAZIER 10FR DISP (MISCELLANEOUS) IMPLANT
SUPPORT WRAP ARM LG (MISCELLANEOUS) ×1 IMPLANT
SUT ETHIBOND 2 V 37 (SUTURE) ×2 IMPLANT
SUT FIBERWIRE #2 38 REV NDL BL (SUTURE) ×4
SUT MNCRL AB 4-0 PS2 18 (SUTURE) ×3 IMPLANT
SUT VIC AB 2-0 CT1 27 (SUTURE) ×2
SUT VIC AB 2-0 CT1 TAPERPNT 27 (SUTURE) ×2 IMPLANT
SUTURE FIBERWR#2 38 REV NDL BL (SUTURE) IMPLANT
SUTURE TAPE 1.3 40 TPR END (SUTURE) IMPLANT
SUTURETAPE 1.3 40 TPR END (SUTURE) ×8
TAPE LABRALWHITE 1.5X36 (TAPE) IMPLANT
TAPE SUT LABRALTAP WHT/BLK (SUTURE) IMPLANT
TOWEL OR 17X26 10 PK STRL BLUE (TOWEL DISPOSABLE) ×3 IMPLANT
TOWEL OR NON WOVEN STRL DISP B (DISPOSABLE) ×3 IMPLANT
WATER STERILE IRR 1000ML POUR (IV SOLUTION) ×3 IMPLANT

## 2021-12-17 NOTE — Anesthesia Procedure Notes (Signed)
Procedure Name: Intubation ?Date/Time: 12/17/2021 1:01 PM ?Performed by: Gerald Leitz, CRNA ?Pre-anesthesia Checklist: Patient identified, Patient being monitored, Timeout performed, Emergency Drugs available and Suction available ?Patient Re-evaluated:Patient Re-evaluated prior to induction ?Oxygen Delivery Method: Circle system utilized ?Preoxygenation: Pre-oxygenation with 100% oxygen ?Induction Type: IV induction ?Ventilation: Mask ventilation without difficulty ?Laryngoscope Size: Mac and 3 ?Grade View: Grade I ?Tube type: Oral ?Tube size: 7.5 mm ?Number of attempts: 1 ?Airway Equipment and Method: Stylet ?Placement Confirmation: ETT inserted through vocal cords under direct vision, positive ETCO2 and breath sounds checked- equal and bilateral ?Secured at: 21 cm ?Tube secured with: Tape ?Dental Injury: Teeth and Oropharynx as per pre-operative assessment  ? ? ? ? ?

## 2021-12-17 NOTE — H&P (Signed)
Russell Clark is an 56 y.o. male.   ?Chief Complaint: L shoulder injury ?HPI: s/p fall with displaced comminuted proximal humerus fracture. ? ?Past Medical History:  ?Diagnosis Date  ? Diabetes mellitus, type II ([place])   ? GERD (gastroesophageal reflux disease)   ? Hyperlipidemia   ? Hypertension   ? Neuropathy   ? Sleep apnea   ? Venous insufficiency   ? ? ?Past Surgical History:  ?Procedure Laterality Date  ? CARPAL TUNNEL RELEASE Right   ? CATARACT EXTRACTION W/ INTRAOCULAR LENS IMPLANT Bilateral   ? OTHER SURGICAL HISTORY    ? L foot, Cataracts  ? ? ?Family History  ?Problem Relation Age of Onset  ? Lupus Mother   ? Cancer Father   ? ?Social History:  reports that he has quit smoking. His smoking use included cigarettes. He has a 25.00 pack-year smoking history. He has never used smokeless tobacco. He reports that he does not drink alcohol and does not use drugs. ? ?Allergies:  ?Allergies  ?Allergen Reactions  ? Aspirin Hives  ? Darvon [Propoxyphene] Hives  ? Ibuprofen Hives  ? Penicillins   ?  Unknown childhood allergy  ? Remdesivir   ?  Family history of having a severe reaction to this  ? ? ?Medications Prior to Admission  ?Medication Sig Dispense Refill  ? acetaminophen (TYLENOL) 500 MG tablet Take 1,000 mg by mouth every 6 (six) hours as needed for moderate pain.    ? amitriptyline (ELAVIL) 25 MG tablet Take 25-50 mg by mouth at bedtime as needed for sleep.    ? buPROPion (WELLBUTRIN SR) 150 MG 12 hr tablet Take 150 mg by mouth 2 (two) times daily.    ? clopidogrel (PLAVIX) 75 MG tablet Take 75 mg by mouth daily.    ? Continuous Blood Gluc Receiver (DEXCOM G6 RECEIVER) DEVI 1 Piece by Does not apply route as needed. 1 each 0  ? Continuous Blood Gluc Sensor (DEXCOM G6 SENSOR) MISC 4 Pieces by Does not apply route once a week. 4 each 2  ? Continuous Blood Gluc Transmit (DEXCOM G6 TRANSMITTER) MISC 1 Piece by Does not apply route as directed. 1 each 1  ? Dulaglutide (TRULICITY) 3 0000000 SOPN Inject 3 mg as  directed once a week. 2 mL 2  ? furosemide (LASIX) 20 MG tablet Take 20 mg by mouth every morning.    ? glipiZIDE (GLUCOTROL XL) 5 MG 24 hr tablet Take 1 tablet (5 mg total) by mouth daily with breakfast. 90 tablet 0  ? insulin regular human CONCENTRATED (HUMULIN R U-500 KWIKPEN) 500 UNIT/ML KwikPen INJECT 140-160 UNITS SUBCUTANEOUSLY 3 TIMES DAILY WITH MEALS (Patient taking differently: Inject 160 Units into the skin 3 (three) times daily with meals.) 30 mL 0  ? Lidocaine 4 % LOTN Apply 1 application. topically daily as needed (pain).    ? loperamide (IMODIUM A-D) 2 MG tablet Take 2 mg by mouth as needed for diarrhea or loose stools.    ? loratadine (CLARITIN) 10 MG tablet Take 10 mg by mouth daily.    ? losartan-hydrochlorothiazide (HYZAAR) 100-12.5 MG tablet Take 1 tablet by mouth daily.    ? metFORMIN (GLUCOPHAGE) 500 MG tablet TAKE 1 TABLET BY MOUTH TWICE DAILY WITH MEALS 180 tablet 0  ? omeprazole (PRILOSEC) 40 MG capsule Take 40 mg by mouth daily.    ? oxyCODONE-acetaminophen (PERCOCET) 5-325 MG tablet Take 1 tablet by mouth every 6 (six) hours as needed. 10 tablet 0  ? pregabalin (LYRICA)  300 MG capsule Take 300 mg by mouth 2 (two) times daily.     ? rosuvastatin (CRESTOR) 20 MG tablet Take 20 mg by mouth at bedtime.    ? triamcinolone cream (KENALOG) 0.1 % Apply 1 application. topically every other day.    ? Insulin Pen Needle (B-D ULTRAFINE III SHORT PEN) 31G X 8 MM MISC USE PEN NEEDLES THREE TIMES DAILY AS DIRECTED 100 each 2  ? ? ?Results for orders placed or performed during the hospital encounter of 12/17/21 (from the past 48 hour(s))  ?Type and screen [place]     Status: None  ? Collection Time: 12/17/21 10:30 AM  ?Result Value Ref Range  ? ABO/RH(D) O NEG   ? Antibody Screen NEG   ? Sample Expiration    ?  12/20/2021,2359 ?Performed at [facility], [place] [address] [place], [place] [address] ?  ?ABO/Rh     Status: None (Preliminary result)  ? Collection Time:  12/17/21 10:35 AM  ?Result Value Ref Range  ? ABO/RH(D) PENDING   ? ?No results found. ? ?Review of Systems  ?All other systems reviewed and are negative. ? ?Blood pressure 119/67, pulse 99, temperature 97.8 ?F (36.6 ?C), temperature source Oral, resp. rate 16, height 5\' 6"  (1.676 m), weight 103 kg, SpO2 98 %. ?Physical Exam ?Constitutional:   ?   Appearance: He is well-developed.  ?HENT:  ?   Head: Atraumatic.  ?Eyes:  ?   Extraocular Movements: Extraocular movements intact.  ?Cardiovascular:  ?   Pulses: Normal pulses.  ?Pulmonary:  ?   Effort: Pulmonary effort is normal.  ?Musculoskeletal:  ?   Comments: L shoulder bruised swollen, pain with ROM. NVID.  ?Skin: ?   General: Skin is warm and dry.  ?Neurological:  ?   Mental Status: He is alert and oriented to person, place, and time.  ?Psychiatric:     ?   Mood and Affect: Mood normal.  ?  ? ?Assessment/Plan ?L shoulder displaced comminuted proximal humerus fracture ?Plan reverse TSA ?Risks / benefits of surgery discussed ?Consent on chart  ?NPO for OR ?Preop antibiotics ? ? ?Rhae Hammock, MD ?12/17/2021, 12:30 PM ? ? ? ?

## 2021-12-17 NOTE — Transfer of Care (Signed)
Immediate Anesthesia Transfer of Care Note ? ?Patient: Russell Clark ? ?Procedure(s) Performed: Procedure(s): ?REVERSE SHOULDER ARTHROPLASTY (Left) ? ?Patient Location: PACU ? ?Anesthesia Type:General ? ?Level of Consciousness: Alert, Awake, Oriented ? ?Airway & Oxygen Therapy: Patient Spontanous Breathing ? ?Post-op Assessment: Report given to RN ? ?Post vital signs: Reviewed and stable ? ?Last Vitals:  ?Vitals:  ? 12/17/21 1240 12/17/21 1245  ?BP: 130/71   ?Pulse: 100 (!) 103  ?Resp: 16 19  ?Temp:    ?SpO2: 96% 98%  ? ? ?Complications: No apparent anesthesia complications ? ?

## 2021-12-17 NOTE — Anesthesia Preprocedure Evaluation (Addendum)
Anesthesia Evaluation  ?Patient identified by MRN, date of birth, ID band ?Patient awake ? ? ? ?Reviewed: ?Allergy & Precautions, NPO status , Patient's Chart, lab work & pertinent test results, reviewed documented beta blocker date and time  ? ?Airway ?Mallampati: III ? ?TM Distance: >3 FB ?Neck ROM: Full ? ? ? Dental ? ?(+) Dental Advisory Given, Poor Dentition, Missing, Edentulous Upper ?  ?Pulmonary ?sleep apnea and Continuous Positive Airway Pressure Ventilation , former smoker,  ?  ?Pulmonary exam normal ?breath sounds clear to auscultation ? ? ? ? ? ? Cardiovascular ?hypertension, Pt. on medications ?+ Peripheral Vascular Disease  ?Normal cardiovascular exam ?Rhythm:Regular Rate:Normal ? ?"poor circulation" ?  ?Neuro/Psych ?negative neurological ROS ? negative psych ROS  ? GI/Hepatic ?Neg liver ROS, GERD  Medicated,  ?Endo/Other  ?diabetes, Poorly Controlled, Type 2, Oral Hypoglycemic Agents, Insulin DependentObesity ?Hyperlipidemia ? Renal/GU ?negative Renal ROS  ?negative genitourinary ?  ?Musculoskeletal ?Left humeral head Fx  ? Abdominal ?(+) + obese,   ?Peds ? Hematology ?negative hematology ROS ?(+) Plavix therapy- last dose 3/27   ?Anesthesia Other Findings ? ? Reproductive/Obstetrics ? ?  ? ? ? ? ? ? ? ? ? ? ? ? ? ?  ?  ? ? ? ? ? ? ? ?Anesthesia Physical ?Anesthesia Plan ? ?ASA: 3 ? ?Anesthesia Plan: General  ? ?Post-op Pain Management: Regional block*  ? ?Induction: Intravenous ? ?PONV Risk Score and Plan: 3 and Treatment may vary due to age or medical condition, Midazolam and Ondansetron ? ?Airway Management Planned: Oral ETT ? ?Additional Equipment:  ? ?Intra-op Plan:  ? ?Post-operative Plan: Extubation in OR ? ?Informed Consent: I have reviewed the patients History and Physical, chart, labs and discussed the procedure including the risks, benefits and alternatives for the proposed anesthesia with the patient or authorized representative who has indicated his/her  understanding and acceptance.  ? ? ? ?Dental advisory given ? ?Plan Discussed with: CRNA and Anesthesiologist ? ?Anesthesia Plan Comments:   ? ? ? ? ? ? ?Anesthesia Quick Evaluation ? ?

## 2021-12-17 NOTE — Progress Notes (Signed)
Assisted Dr. Michael Foster with Left Interscalene Brachial Plexus  block. Side rails up, monitors on throughout procedure. See vital signs in flow sheet. Tolerated Procedure well.  

## 2021-12-17 NOTE — Anesthesia Procedure Notes (Signed)
Anesthesia Regional Block: Interscalene brachial plexus block  ? ?Pre-Anesthetic Checklist: , timeout performed,  Correct Patient, Correct Site, Correct Laterality,  Correct Procedure, Correct Position, site marked,  Risks and benefits discussed,  Surgical consent,  Pre-op evaluation,  At surgeon's request and post-op pain management ? ?Laterality: Left ? ?Prep: chloraprep     ?  ?Needles:  ?Injection technique: Single-shot ? ?Needle Type: Echogenic Stimulator Needle   ? ? ?Needle Length: 10cm  ?Needle Gauge: 21  ? ?Needle insertion depth: 7 cm ? ? ?Additional Needles: ? ? ?Procedures:,,,, ultrasound used (permanent image in chart),,   ?Motor weakness within 7 minutes.  ?Narrative:  ?Start time: 12/17/2021 11:10 AM ?End time: 12/17/2021 11:15 AM ?Injection made incrementally with aspirations every 5 mL. ? ?Performed by: Personally  ?Anesthesiologist: Mal Amabile, MD ? ?Additional Notes: ?Timeout performed. Patient sedated. Relevant anatomy ID'd using [place]. Incremental 2-66ml injection of LA with frequent aspiration. Patient tolerated procedure well. ? ? ? ? ?Left Interscalene Block ?

## 2021-12-17 NOTE — Op Note (Signed)
Procedure(s): ?REVERSE SHOULDER ARTHROPLASTY Procedure Note ? ?Russell Clark ?male ?56 y.o. ?12/17/2021 ? ?Procedure(s) and Anesthesia Type: ?   * REVERSE SHOULDER ARTHROPLASTY - General ? ? ?Indications:  56 y.o. male  With four-part proximal humerus fracture.  Indicated for surgery to try and improve functional outcome. ? ? ?    ?Surgeon: Rhae Hammock  ? ?Assistants: Forensic psychologist was present and scrubbed throughout the procedure and was essential in positioning, retraction, exposure, and closure) ? ?Anesthesia: General endotracheal anesthesia with preoperative interscalene block given by the attending anesthesiologist ? ? ? ?Procedure Detail ? ?REVERSE SHOULDER ARTHROPLASTY ? ? ?Estimated Blood Loss:  200 mL ?        ?Drains: none ? ?Blood Given: none  ?        ?Specimens: none ?       ?Complications:  * No complications entered in OR log * ?        ?Disposition: PACU - hemodynamically stable. ?        ?Condition: stable ?   ? ? 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 The base plate  ?fixation was excellent. ? ?PROCEDURE: The patient was identified in the preoperative holding area  ?where I personally marked the operative site after verifying site, side,  ?and procedure with the patient. An interscalene block given by  ?the attending anesthesiologist in the holding area and the patient was taken back to the operating room where all extremities were  ?carefully padded in position after general anesthesia was induced. She  ?was placed in a beach-chair position and the operative upper extremity was  ?prepped and draped in a standard sterile fashion. An approximately 10-  ?cm incision was made from the tip of the coracoid process to the center  ?point of the humerus at the level of the axilla. Dissection was carried  ?down through subcutaneous tissues to the level of  the cephalic vein  ?which was taken laterally with the deltoid. The pectoralis major was  ?retracted medially. The subdeltoid space was developed and the lateral  ?edge of the conjoined tendon was identified. The undersurface of  ?conjoined tendon was palpated and the musculocutaneous nerve was not in  ?the field. Retractor was placed underneath the conjoined and second  ?retractor was placed lateral into the deltoid. biceps tendon was nearly completely torn and was tenotomized.   ?The tuberosity fragments were mobilized and kept in continuity with the subscapularis and superior and posterior cuff.  The head fragment was removed.  The rotator interval was opened.  Preliminary sutures were placed around the tuberosities for later fixation. ? ? ?The glenoid was exposed with the arm in an  ?abducted extended position. The anterior and posterior labrum were  ?completely excised and the capsule was released circumferentially to  ?allow for exposure of the glenoid for preparation. The 2.5 mm drill was  ?placed using the guide in 5-10? inferior angulation and the tap was then advanced in the same hole. Small and large reamers were then used. The tap was then removed and the Metaglene was then screwed in with excellent purchase.  The peripheral guide was then used to drilled measured and filled peripheral locking screws. The size 32 standard glenosphere was then impacted on the [facility] taper and the central screw was placed.  ? ?The humerus was then again exposed and the diaphyseal  reamers were used to size the implant.  The size 12 broach was inserted with a nice press-fit.  The joint was reduced and with this implant it was felt that soft tissue tensioning was appropriate with excellent stability and appropriate positioning of the tuberosity fragments. Therefore, final humeral stem was placed press-fit.   ?The tuberosities were then repaired around the implant with 4 suture tapes and 2 FiberWire's including a vertical  tension band with excellent reconstruction.  Soft tissue tension was appropriate.  ?The joint was then copiously irrigated with pulse  ?lavage and the wound was then closed.  Skin was closed with 2-0 Vicryl in a deep dermal layer and 4-0  ?Monocryl for skin closure. Steri-Strips were applied. Sterile  ?dressings were then applied as well as a sling. The patient was allowed  ?to awaken from general anesthesia, transferred to stretcher, and taken  ?to recovery room in stable condition.  ? ?POSTOPERATIVE PLAN: The patient will be observed in the recovery room and if his pain is well controlled with the regional block and he is hemodynamically stable he could be discharged home today with family. ?

## 2021-12-17 NOTE — Discharge Instructions (Addendum)
Discharge Instructions after Reverse Total Shoulder Arthroplasty ? ? ?A sling has been provided for you. You are to wear this at all times (except for bathing and dressing), until your first post operative visit with Dr. Ave Filter. Please also wear while sleeping at night. While you bath and dress, let the arm/elbow extend straight down to stretch your elbow. Wiggle your fingers and pump your first while your in the sling to prevent hand swelling. ?Use ice on the shoulder intermittently over the first 48 hours after surgery. Continue to use ice or and ice machine as needed after 48 hours for pain control/swelling.  ?Pain medicine has been prescribed for you.  ?Use your medicine liberally over the first 48 hours, and then you can begin to taper your use. You may take Extra Strength Tylenol or Tylenol only in place of the pain pills. DO NOT take ANY nonsteroidal anti-inflammatory pain medications: Advil, Motrin, Ibuprofen, Aleve, Naproxen or Naprosyn.  ?Restart Plavix the day after surgery.   ?Leave your dressing on until your first follow up visit.  You may shower with the dressing.  Hold your arm as if you still have your sling on while you shower. ?Simply allow the water to wash over the site and then pat dry. Make sure your axilla (armpit) is completely dry after showering. ? ? ? ?Please call [phone] during normal business hours or [phone] after hours for any problems. Including the following: ? ?- excessive redness of the incisions ?- drainage for more than 4 days ?- fever of more than 101.5 F ? ?*Please note that pain medications will not be refilled after hours or on weekends. ? ? ? Dental Antibiotics: ? ?In most cases prophylactic antibiotics for Dental procdeures after total joint surgery are not necessary. ? ?Exceptions are as follows: ? ?1. History of prior total joint infection ? ?2. Severely immunocompromised (Organ Transplant, cancer chemotherapy, Rheumatoid biologic ?meds such as Humera) ? ?3.  Poorly controlled diabetes (A1C &gt; 8.0, blood glucose over 200) ? ?If you have one of these conditions, contact your surgeon for an antibiotic prescription, prior to your ?dental procedure.  ?

## 2021-12-18 ENCOUNTER — Encounter (HOSPITAL_COMMUNITY): Payer: Self-pay | Admitting: Orthopedic Surgery

## 2021-12-21 ENCOUNTER — Other Ambulatory Visit: Payer: Self-pay | Admitting: "Endocrinology

## 2021-12-30 NOTE — Anesthesia Postprocedure Evaluation (Signed)
Anesthesia Post Note ? ?Patient: Russell Clark ? ?Procedure(s) Performed: REVERSE SHOULDER ARTHROPLASTY (Left: Shoulder) ? ?  ? ?Patient location during evaluation: PACU ?Anesthesia Type: General ?Level of consciousness: awake and alert ?Pain management: pain level controlled ?Vital Signs Assessment: post-procedure vital signs reviewed and stable ?Respiratory status: spontaneous breathing, nonlabored ventilation, respiratory function stable and patient connected to nasal cannula oxygen ?Cardiovascular status: blood pressure returned to baseline and stable ?Postop Assessment: no apparent nausea or vomiting ?Anesthetic complications: no ? ? ?No notable events documented. ? ?Last Vitals:  ?Vitals:  ? 12/17/21 1630 12/17/21 1705  ?BP:  138/64  ?Pulse:  100  ?Resp: (P) 18 18  ?Temp:  36.7 ?C  ?SpO2:  96%  ?  ?Last Pain:  ?Vitals:  ? 12/17/21 1705  ?TempSrc:   ?PainSc: 0-No pain  ? ?Pain Goal: Patients Stated Pain Goal: 0 (12/17/21 1545) ? ?  ?  ?  ?  ?  ?  ?  ? ?Marcene Duos E ? ? ? ? ?

## 2022-01-15 ENCOUNTER — Other Ambulatory Visit: Payer: Self-pay | Admitting: "Endocrinology

## 2022-02-15 ENCOUNTER — Other Ambulatory Visit: Payer: Self-pay | Admitting: "Endocrinology

## 2022-02-25 LAB — COMPREHENSIVE METABOLIC PANEL
ALT: 29 IU/L (ref 0–44)
AST: 21 IU/L (ref 0–40)
Albumin/Globulin Ratio: 1.8 (ref 1.2–2.2)
Albumin: 4.4 g/dL (ref 3.8–4.9)
Alkaline Phosphatase: 142 IU/L — ABNORMAL HIGH (ref 44–121)
BUN/Creatinine Ratio: 19 (ref 9–20)
BUN: 20 mg/dL (ref 6–24)
Bilirubin Total: 0.7 mg/dL (ref 0.0–1.2)
CO2: 22 mmol/L (ref 20–29)
Calcium: 9.9 mg/dL (ref 8.7–10.2)
Chloride: 95 mmol/L — ABNORMAL LOW (ref 96–106)
Creatinine, Ser: 1.08 mg/dL (ref 0.76–1.27)
Globulin, Total: 2.4 g/dL (ref 1.5–4.5)
Glucose: 481 mg/dL — ABNORMAL HIGH (ref 70–99)
Potassium: 4.7 mmol/L (ref 3.5–5.2)
Sodium: 137 mmol/L (ref 134–144)
Total Protein: 6.8 g/dL (ref 6.0–8.5)
eGFR: 82 mL/min/{1.73_m2} (ref >59)

## 2022-02-25 LAB — TSH: TSH: 1.02 u[IU]/mL (ref 0.450–4.500)

## 2022-02-25 LAB — LIPID PANEL
Chol/HDL Ratio: 4 ratio (ref 0.0–5.0)
Cholesterol, Total: 107 mg/dL (ref 100–199)
HDL: 27 mg/dL — ABNORMAL LOW (ref >39)
LDL Chol Calc (NIH): 44 mg/dL (ref 0–99)
Triglycerides: 229 mg/dL — ABNORMAL HIGH (ref 0–149)
VLDL Cholesterol Cal: 36 mg/dL (ref 5–40)

## 2022-02-25 LAB — T4, FREE: Free T4: 1.09 ng/dL (ref 0.82–1.77)

## 2022-03-03 ENCOUNTER — Other Ambulatory Visit: Payer: Self-pay | Admitting: "Endocrinology

## 2022-03-05 ENCOUNTER — Ambulatory Visit (INDEPENDENT_AMBULATORY_CARE_PROVIDER_SITE_OTHER): Payer: Medicare Other | Admitting: "Endocrinology

## 2022-03-05 ENCOUNTER — Encounter: Payer: Self-pay | Admitting: "Endocrinology

## 2022-03-05 VITALS — BP 112/58 | HR 92 | Ht 66.0 in | Wt 221.6 lb

## 2022-03-05 DIAGNOSIS — Z6835 Body mass index (BMI) 35.0-35.9, adult: Secondary | ICD-10-CM

## 2022-03-05 DIAGNOSIS — E1165 Type 2 diabetes mellitus with hyperglycemia: Secondary | ICD-10-CM | POA: Diagnosis not present

## 2022-03-05 DIAGNOSIS — E782 Mixed hyperlipidemia: Secondary | ICD-10-CM | POA: Diagnosis not present

## 2022-03-05 DIAGNOSIS — I10 Essential (primary) hypertension: Secondary | ICD-10-CM | POA: Diagnosis not present

## 2022-03-05 LAB — POCT GLYCOSYLATED HEMOGLOBIN (HGB A1C): HbA1c, POC (controlled diabetic range): 9.3 % — AB (ref 0.0–7.0)

## 2022-03-05 MED ORDER — TRULICITY 4.5 MG/0.5ML ~~LOC~~ SOAJ: 4.5000 mg | SUBCUTANEOUS | 3 refills | Status: DC

## 2022-03-05 NOTE — Patient Instructions (Signed)

## 2022-03-05 NOTE — Progress Notes (Signed)
03/05/2022   Endocrinology follow-up note    Subjective:    Patient ID: Russell Clark, male    DOB: 1966-09-07.  he is being seen in follow-up for management of currently uncontrolled symptomatic type 2 diabetes, hyperlipidemia, hypertension. PCP:  Thea Alken   Past Medical History:  Diagnosis Date   Diabetes mellitus, type II ([place])    GERD (gastroesophageal reflux disease)    Hyperlipidemia    Hypertension    Neuropathy    Sleep apnea    Venous insufficiency    Past Surgical History:  Procedure Laterality Date   CARPAL TUNNEL RELEASE Right    CATARACT EXTRACTION W/ INTRAOCULAR LENS IMPLANT Bilateral    OTHER SURGICAL HISTORY     L foot, Cataracts   REVERSE SHOULDER ARTHROPLASTY Left 12/17/2021   Procedure: REVERSE SHOULDER ARTHROPLASTY;  Surgeon: Tania Ade, MD;  Location: [facility];  Service: Orthopedics;  Laterality: Left;   Social History   Socioeconomic History   Marital status: Divorced    Spouse name: Not on file   Number of children: Not on file   Years of education: Not on file   Highest education level: Not on file  Occupational History   Not on file  Tobacco Use   Smoking status: Former    Packs/day: 1.00    Years: 25.00    Total pack years: 25.00    Types: Cigarettes   Smokeless tobacco: Never  Vaping Use   Vaping Use: Never used  Substance and Sexual Activity   Alcohol use: No   Drug use: No   Sexual activity: Not on file  Other Topics Concern   Not on file  Social History Narrative   Not on file   Social Determinants of Health   Financial Resource Strain: Not on file  Food Insecurity: Not on file  Transportation Needs: Not on file  Physical Activity: Not on file  Stress: Not on file  Social Connections: Not on file   Outpatient Encounter Medications as of 03/05/2022  Medication Sig   Dulaglutide (TRULICITY) 4.5 UJ/8.1XB SOPN Inject 4.5 mg as directed once a week.   acetaminophen (TYLENOL) 500 MG tablet Take 1,000 mg by  mouth every 6 (six) hours as needed for moderate pain.   amitriptyline (ELAVIL) 25 MG tablet Take 25-50 mg by mouth at bedtime as needed for sleep.   buPROPion (WELLBUTRIN SR) 150 MG 12 hr tablet Take 150 mg by mouth 2 (two) times daily.   clopidogrel (PLAVIX) 75 MG tablet Take 75 mg by mouth daily.   Continuous Blood Gluc Receiver (DEXCOM G6 RECEIVER) DEVI 1 Piece by Does not apply route as needed.   Continuous Blood Gluc Sensor (DEXCOM G6 SENSOR) MISC 4 Pieces by Does not apply route once a week.   Continuous Blood Gluc Transmit (DEXCOM G6 TRANSMITTER) MISC 1 Piece by Does not apply route as directed.   furosemide (LASIX) 20 MG tablet Take 20 mg by mouth every morning.   glipiZIDE (GLUCOTROL XL) 5 MG 24 hr tablet Take 1 tablet (5 mg total) by mouth daily with breakfast.   Insulin Pen Needle (B-D ULTRAFINE III SHORT PEN) 31G X 8 MM MISC USE PEN NEEDLES THREE TIMES DAILY AS DIRECTED   insulin regular human CONCENTRATED (HUMULIN R U-500 KWIKPEN) 500 UNIT/ML KwikPen INJECT 140-160 UNITS SUBCUTANEOUSLY THREE TIMES DAILY WITH MEAL(S)   Lidocaine 4 % LOTN Apply 1 application. topically daily as needed (pain).   loperamide (IMODIUM A-D) 2 MG tablet Take 2 mg  by mouth as needed for diarrhea or loose stools.   loratadine (CLARITIN) 10 MG tablet Take 10 mg by mouth daily.   losartan-hydrochlorothiazide (HYZAAR) 100-12.5 MG tablet Take 1 tablet by mouth daily.   metFORMIN (GLUCOPHAGE) 500 MG tablet TAKE 1 TABLET BY MOUTH TWICE DAILY WITH MEALS   omeprazole (PRILOSEC) 40 MG capsule Take 40 mg by mouth daily.   pregabalin (LYRICA) 300 MG capsule Take 300 mg by mouth 2 (two) times daily.    rosuvastatin (CRESTOR) 20 MG tablet Take 20 mg by mouth at bedtime.   triamcinolone cream (KENALOG) 0.1 % Apply 1 application. topically every other day.   [DISCONTINUED] methocarbamol (ROBAXIN) 500 MG tablet Take 1 tablet (500 mg total) by mouth every 8 (eight) hours as needed for muscle spasms.   [DISCONTINUED]  oxyCODONE-acetaminophen (PERCOCET) 5-325 MG tablet Take 1 tablet by mouth every 6 (six) hours as needed.   [DISCONTINUED] TRULICITY 3 DP/8.2UM SOPN INJECT 3 MG SUBCUTANEOUSLY AS DIRECTED ONCE A WEEK   No facility-administered encounter medications on file as of 03/05/2022.    ALLERGIES: Allergies  Allergen Reactions   Aspirin Hives   Darvon [Propoxyphene] Hives   Ibuprofen Hives   Penicillins     Unknown childhood allergy   Remdesivir     Family history of having a severe reaction to this    VACCINATION STATUS:  There is no immunization history on file for this patient.  Diabetes He presents for his follow-up diabetic visit. He has type 2 diabetes mellitus. Onset time: He was diagnosed at approximate age of 41 years. His disease course has been improving. There are no hypoglycemic associated symptoms. Pertinent negatives for hypoglycemia include no confusion, headaches, pallor or seizures. Associated symptoms include polydipsia and polyuria. Pertinent negatives for diabetes include no blurred vision, no chest pain, no fatigue, no polyphagia and no weakness. There are no hypoglycemic complications. Symptoms are improving. Diabetic complications include peripheral neuropathy. Risk factors for coronary artery disease include dyslipidemia, diabetes mellitus, hypertension, male sex, sedentary lifestyle, tobacco exposure and obesity. Current diabetic treatment includes insulin injections and oral agent (dual therapy). He is compliant with treatment most of the time. His weight is fluctuating minimally. He is following a generally unhealthy diet. When asked about meal planning, he reported none. He never participates in exercise. His home blood glucose trend is increasing steadily. His breakfast blood glucose range is generally >200 mg/dl. His lunch blood glucose range is generally >200 mg/dl. His dinner blood glucose range is generally >200 mg/dl. His bedtime blood glucose range is generally >200  mg/dl. His overall blood glucose range is >200 mg/dl. (Russell Clark presents with his Dexcom CGM.  His CGM analysis shows persistent severe hyperglycemia averaging 239 for the last 14 days.  He has 33% time range, 20% level 1 hyperglycemia, 45% level 2 hyperglycemia.  He does not have hypoglycemia . His point-of-care A1c is 9.3%, slowly improving from 10.4%.  This is despite large dose of insulin in the form of U500 and multiple other medications.   ) An ACE inhibitor/angiotensin II receptor blocker is being taken. He sees a podiatrist.Eye exam is current (He is due for his dilated eye exam, promises to do before next visit.).  Hyperlipidemia This is a chronic problem. The current episode started more than 1 year ago. The problem is uncontrolled. Recent lipid tests were reviewed and are high. Exacerbating diseases include diabetes and obesity. Factors aggravating his hyperlipidemia include smoking. Pertinent negatives include no chest pain, myalgias or shortness of breath.  Current antihyperlipidemic treatment includes statins. Risk factors for coronary artery disease include dyslipidemia, diabetes mellitus, a sedentary lifestyle and obesity.  Hypertension This is a chronic problem. The current episode started more than 1 year ago. The problem is controlled. Pertinent negatives include no blurred vision, chest pain, headaches, neck pain, palpitations or shortness of breath. Risk factors for coronary artery disease include dyslipidemia, diabetes mellitus, male gender, sedentary lifestyle and smoking/tobacco exposure. Past treatments include angiotensin blockers.    Review of systems    Objective:    BP (!) 112/58   Pulse 92   Ht 5' 6" (1.676 m)   Wt 221 lb 9.6 oz (100.5 kg)   BMI 35.77 kg/m   Wt Readings from Last 3 Encounters:  03/05/22 221 lb 9.6 oz (100.5 kg)  12/17/21 227 lb (103 kg)  11/28/21 226 lb (102.5 kg)     Physical Exam- Limited    Recent Results (from the past 2160 hour(s))   Comprehensive metabolic panel     Status: Abnormal   Collection Time: 12/13/21  1:58 PM  Result Value Ref Range   Sodium 139 135 - 145 mmol/L   Potassium 3.3 (L) 3.5 - 5.1 mmol/L   Chloride 104 98 - 111 mmol/L   CO2 26 22 - 32 mmol/L   Glucose, Bld 199 (H) 70 - 99 mg/dL    Comment: Glucose reference range applies only to samples taken after fasting for at least 8 hours.   BUN 19 6 - 20 mg/dL   Creatinine, Ser 1.21 0.61 - 1.24 mg/dL   Calcium 8.9 8.9 - 10.3 mg/dL   Total Protein 6.5 6.5 - 8.1 g/dL   Albumin 3.5 3.5 - 5.0 g/dL   AST 14 (L) 15 - 41 U/L   ALT 17 0 - 44 U/L   Alkaline Phosphatase 118 38 - 126 U/L   Total Bilirubin 0.9 0.3 - 1.2 mg/dL   GFR, Estimated >60 >60 mL/min    Comment: (NOTE) Calculated using the CKD-EPI Creatinine Equation (2021)    Anion gap 9 5 - 15    Comment: Performed at [facility] [address] [place], [place] [address]  CBC with Differential     Status: Abnormal   Collection Time: 12/13/21  1:58 PM  Result Value Ref Range   WBC 11.3 (H) 4.0 - 10.5 K/uL   RBC 4.95 4.22 - 5.81 MIL/uL   Hemoglobin 13.4 13.0 - 17.0 g/dL   HCT 40.1 39.0 - 52.0 %   MCV 81.0 80.0 - 100.0 fL   MCH 27.1 26.0 - 34.0 pg   MCHC 33.4 30.0 - 36.0 g/dL   RDW 13.8 11.5 - 15.5 %   Platelets 231 150 - 400 K/uL   nRBC 0.0 0.0 - 0.2 %   Neutrophils Relative % 77 %   Neutro Abs 8.6 (H) 1.7 - 7.7 K/uL   Lymphocytes Relative 15 %   Lymphs Abs 1.7 0.7 - 4.0 K/uL   Monocytes Relative 8 %   Monocytes Absolute 0.9 0.1 - 1.0 K/uL   Eosinophils Relative 0 %   Eosinophils Absolute 0.0 0.0 - 0.5 K/uL   Basophils Relative 0 %   Basophils Absolute 0.0 0.0 - 0.1 K/uL   Immature Granulocytes 0 %   Abs Immature Granulocytes 0.05 0.00 - 0.07 K/uL    Comment: Performed at [facility] [address] [place], [place] [address]  Type and screen [place]     Status: None  Collection Time: 12/17/21 10:30 AM  Result Value Ref Range   ABO/RH(D) O NEG     Antibody Screen NEG    Sample Expiration      12/20/2021,2359 Performed at [facility], [place] [address] [place], [place] [address]   ABO/Rh     Status: None   Collection Time: 12/17/21 10:35 AM  Result Value Ref Range   ABO/RH(D)      Jenetta Downer NEG Performed at [facility] [address], [place],  [address]   Glucose, capillary     Status: Abnormal   Collection Time: 12/17/21  2:54 PM  Result Value Ref Range   Glucose-Capillary 402 (H) 70 - 99 mg/dL    Comment: Glucose reference range applies only to samples taken after fasting for at least 8 hours.  Glucose, capillary     Status: Abnormal   Collection Time: 12/17/21  4:56 PM  Result Value Ref Range   Glucose-Capillary 333 (H) 70 - 99 mg/dL    Comment: Glucose reference range applies only to samples taken after fasting for at least 8 hours.  Comprehensive metabolic panel     Status: Abnormal   Collection Time: 02/24/22  1:50 PM  Result Value Ref Range   Glucose 481 (H) 70 - 99 mg/dL   BUN 20 6 - 24 mg/dL   Creatinine, Ser 1.08 0.76 - 1.27 mg/dL   eGFR 82 >59 mL/min/1.73   BUN/Creatinine Ratio 19 9 - 20   Sodium 137 134 - 144 mmol/L   Potassium 4.7 3.5 - 5.2 mmol/L   Chloride 95 (L) 96 - 106 mmol/L   CO2 22 20 - 29 mmol/L   Calcium 9.9 8.7 - 10.2 mg/dL   Total Protein 6.8 6.0 - 8.5 g/dL   Albumin 4.4 3.8 - 4.9 g/dL   Globulin, Total 2.4 1.5 - 4.5 g/dL   Albumin/Globulin Ratio 1.8 1.2 - 2.2   Bilirubin Total 0.7 0.0 - 1.2 mg/dL   Alkaline Phosphatase 142 (H) 44 - 121 IU/L   AST 21 0 - 40 IU/L   ALT 29 0 - 44 IU/L  Lipid panel     Status: Abnormal   Collection Time: 02/24/22  1:50 PM  Result Value Ref Range   Cholesterol, Total 107 100 - 199 mg/dL   Triglycerides 229 (H) 0 - 149 mg/dL   HDL 27 (L) >39 mg/dL   VLDL Cholesterol Cal 36 5 - 40 mg/dL   LDL Chol Calc (NIH) 44 0 - 99 mg/dL   Chol/HDL Ratio 4.0 0.0 - 5.0 ratio    Comment:                                   T.  Chol/HDL Ratio                                             Men  Women                               1/2 Avg.Risk  3.4    3.3  Avg.Risk  5.0    4.4                                2X Avg.Risk  9.6    7.1                                3X Avg.Risk 23.4   11.0   TSH     Status: None   Collection Time: 02/24/22  1:50 PM  Result Value Ref Range   TSH 1.020 0.450 - 4.500 uIU/mL  T4, free     Status: None   Collection Time: 02/24/22  1:50 PM  Result Value Ref Range   Free T4 1.09 0.82 - 1.77 ng/dL  HgB A1c     Status: Abnormal   Collection Time: 03/05/22  9:06 AM  Result Value Ref Range   Hemoglobin A1C     HbA1c POC (<> result, manual entry)     HbA1c, POC (prediabetic range)     HbA1c, POC (controlled diabetic range) 9.3 (A) 0.0 - 7.0 %      Latest Ref Rng & Units 02/24/2022    1:50 PM 12/13/2021    1:58 PM 08/15/2021   10:29 AM  CMP  Glucose 70 - 99 mg/dL 481  199  284   BUN 6 - 24 mg/dL _0 Creatinine 0.76 - 1.27 mg/dL 1.08  1.21  1.08   Sodium 134 - 144 mmol/L 137  139  138   Potassium 3.5 - 5.2 mmol/L 4.7  3.3  4.0   Chloride 96 - 106 mmol/L 95  104  96   CO2 20 - 29 mmol/L _1 Calcium 8.7 - 10.2 mg/dL 9.9  8.9  8.7   Total Protein 6.0 - 8.5 g/dL 6.8  6.5  6.1   Total Bilirubin 0.0 - 1.2 mg/dL 0.7  0.9  0.5   Alkaline Phos 44 - 121 IU/L 142  118  129   AST 0 - 40 IU/L _2 ALT 0 - 44 IU/L _3 Lipid Panel     Component Value Date/Time   CHOL 107 02/24/2022 1350   TRIG 229 (H) 02/24/2022 1350   HDL 27 (L) 02/24/2022 1350   CHOLHDL 4.0 02/24/2022 1350   LDLCALC 44 02/24/2022 1350   LABVLDL 36 02/24/2022 1350     Assessment & Plan:   1. Uncontrolled type 2 diabetes mellitus with other specified complication, with Rubis-term current use of insulin ([place])  - Patient has currently uncontrolled symptomatic type 2 DM since  56 years of age.  Russell Clark presents with his Dexcom CGM.  His CGM analysis shows  persistent severe hyperglycemia averaging 239 for the last 14 days.  He has 33% time range, 20% level 1 hyperglycemia, 45% level 2 hyperglycemia.  He does not have hypoglycemia . His point-of-care A1c is 9.3%, slowly improving from 10.4%.  This is despite large dose of insulin in the form of U500 and multiple other medications.    -Recent labs reviewed, showing normal renal function,.  -his diabetes is complicated by peripheral neuropathy, peripheral arterial disease,  sedentary life and Russell Clark remains at extremely  high risk for more acute and chronic complications which include CAD, CVA, CKD, retinopathy, and  neuropathy. These are all discussed in detail with the patient.  - I have counseled him on diet management and weight loss, by adopting a carbohydrate restricted/protein rich diet.  - he acknowledges that there is a room for improvement in his food and drink choices. - Suggestion is made for him to avoid simple carbohydrates  from his diet including Cakes, Sweet Desserts, Ice Cream, Soda (diet and regular), Sweet Tea, Candies, Chips, Cookies, Store Bought Juices, Alcohol , Artificial Sweeteners,  Coffee Creamer, and "Sugar-free" Products, Lemonade. This will help patient to have more stable blood glucose profile and potentially avoid unintended weight gain.  The following Lifestyle Medicine recommendations according to [facility]) were discussed and and offered to patient and he  agrees to start the journey:  A. Whole Foods, Plant-Based Nutrition comprising of fruits and vegetables, plant-based proteins, whole-grain carbohydrates was discussed in detail with the patient.   A list for source of those nutrients were also provided to the patient.  Patient will use only water or unsweetened tea for hydration. B.  The need to stay away from risky substances including alcohol, smoking; obtaining 7 to 9 hours of restorative sleep, at least 150 minutes of  moderate intensity exercise weekly, the importance of healthy social connections,  and stress management techniques were discussed. C.  A full color page of  Calorie density of various food groups per pound showing examples of each food groups was provided to the patient.   - he is advised to stick to a routine mealtimes to eat 3 meals  a day and avoid unnecessary snacks ( to snack only to correct hypoglycemia).    - I have approached him with the following individualized plan to manage diabetes and patient agrees:   -While he is working on his lifestyle adjustment, I discussed and adjusted his insulin  U500 as follows:    He is advised to continue  U500 160 units with breakfast, 160 units with lunch, and 140 units with supper  for [place] blood glucose readings above 90 mg per DL. -He is advised not to inject insulin without a meal and without proper monitoring of blood glucose. He is encouraged to use his CGM at all times, especially before meals and at bedtime.  -He is currently using Dexcom CGM, advised to use it at all times.  -Patient is encouraged to call clinic for blood glucose levels less than 70 or above 200 mg /dl. -He is advised to continue metformin 500 mg p.o. twice daily, glipizide 5 mg XL p.o. daily at breakfast.    -He continues to tolerate Trulicity.  I discussed and increase his Trulicity to 4.5 mg subcutaneously weekly.   Side effects and precautions discussed with him.     - Patient specific target  A1c;  LDL, HDL, Triglycerides were discussed in detail.  2) BP/HTN:  -His blood pressure is controlled to target.  He is advised to continue blood pressure medications including Hyzaar.   3) Lipids/HPL: His recent lipid panel showed significantly improved lipid panel with LDL of 44, triglycerides 324.  The above discussed lifestyle nutrition will have significant impact on his dyslipidemia as well.  In the meantime, he is advised to continue pravastatin 40 mg p.o.  nightly as well as fenofibrate 140 mg p.o. nightly.   4)  Weight/Diet: His BMI 35.77- lost 16 pounds overall.   He has abandoned the pursuit of bariatric surgery at this time.  He still is a  candidate for modest weight loss.  The above described whole food plant-based diet will help address his weight concerns.    In the meantime, CDE Consult  in progress , exercise as tolerated, and detailed carbohydrates information provided.  5) Chronic Care/Health Maintenance:  -he  is on ACEI/ARB and Statin medications and  is encouraged to continue to follow up with Ophthalmology (he is significantly overdue), Dentist,  Podiatrist at least yearly or according to recommendations, and advised to  stay away from smoking. I have recommended yearly flu vaccine and pneumonia vaccination at least every 5 years; moderate intensity exercise for up to 150 minutes weekly; and  sleep for at least 7 hours a day.  Point-of-care urine microalbumin test is negative.  He is encouraged to continue follow-up in the vein clinic.  - I advised patient to maintain close follow up with Russell Clark for primary care needs.   I spent 44 minutes in the care of the patient today including review of labs from [facility], Lipids, Thyroid Function, Hematology (current and previous including abstractions from other facilities); face-to-face time discussing  his blood glucose readings/logs, discussing hypoglycemia and hyperglycemia episodes and symptoms, medications doses, his options of short and [place] term treatment based on the latest standards of care / guidelines;  discussion about incorporating lifestyle medicine;  and documenting the encounter.    Please refer to Patient Instructions for Blood Glucose Monitoring and Insulin/Medications Dosing Guide"  in media tab for additional information. Please  also refer to " Patient Self Inventory" in the Media  tab for reviewed elements of pertinent patient history.  Russell Clark participated  in the discussions, expressed understanding, and voiced agreement with the above plans.  All questions were answered to his satisfaction. he is encouraged to contact clinic should he have any questions or concerns prior to his return visit.   Follow up plan: - Return in about 4 months (around 07/05/2022) for F/U with Pre-visit Labs, Meter/CGM/Logs, A1c here.  Glade Lloyd, MD Phone: [phone]  Fax: [phone]   03/05/2022, 11:09 AM   This note was partially dictated with voice recognition software. Similar sounding words can be transcribed inadequately or may not  be corrected upon review.

## 2022-03-13 ENCOUNTER — Other Ambulatory Visit: Payer: Self-pay | Admitting: "Endocrinology

## 2022-04-21 ENCOUNTER — Other Ambulatory Visit: Payer: Self-pay

## 2022-04-21 DIAGNOSIS — E1165 Type 2 diabetes mellitus with hyperglycemia: Secondary | ICD-10-CM

## 2022-04-21 MED ORDER — GLIPIZIDE ER 5 MG PO TB24: 5.0000 mg | ORAL_TABLET | Freq: Every day | ORAL | 0 refills | Status: DC

## 2022-05-03 ENCOUNTER — Other Ambulatory Visit: Payer: Self-pay | Admitting: "Endocrinology

## 2022-05-03 DIAGNOSIS — E1165 Type 2 diabetes mellitus with hyperglycemia: Secondary | ICD-10-CM

## 2022-05-15 ENCOUNTER — Other Ambulatory Visit: Payer: Self-pay | Admitting: "Endocrinology

## 2022-05-27 ENCOUNTER — Telehealth: Payer: Self-pay

## 2022-05-27 NOTE — Telephone Encounter (Signed)
Pt returning your call

## 2022-05-27 NOTE — Telephone Encounter (Signed)
Left a message requesting pt to return call to the office. 

## 2022-05-27 NOTE — Telephone Encounter (Signed)
Left a message requesting pt return call to the office. ?

## 2022-05-28 ENCOUNTER — Telehealth: Payer: Self-pay | Admitting: "Endocrinology

## 2022-05-28 DIAGNOSIS — E1165 Type 2 diabetes mellitus with hyperglycemia: Secondary | ICD-10-CM

## 2022-05-28 MED ORDER — HUMULIN R U-500 KWIKPEN 500 UNIT/ML ~~LOC~~ SOPN: PEN_INJECTOR | SUBCUTANEOUS | 0 refills | Status: DC

## 2022-05-28 MED ORDER — METFORMIN HCL 500 MG PO TABS: 500.0000 mg | ORAL_TABLET | Freq: Two times a day (BID) | ORAL | 0 refills | Status: DC

## 2022-05-28 NOTE — Telephone Encounter (Signed)
F/u    The patient returning the nurse call.

## 2022-05-28 NOTE — Telephone Encounter (Signed)
Left a message requesting pt return call to the office. ?

## 2022-05-28 NOTE — Telephone Encounter (Signed)
Confirmed with pt to send his Rxs to [facility].

## 2022-06-21 ENCOUNTER — Other Ambulatory Visit: Payer: Self-pay | Admitting: "Endocrinology

## 2022-06-21 DIAGNOSIS — E1165 Type 2 diabetes mellitus with hyperglycemia: Secondary | ICD-10-CM

## 2022-07-07 ENCOUNTER — Ambulatory Visit: Payer: Medicare Other | Admitting: "Endocrinology

## 2022-07-10 ENCOUNTER — Ambulatory Visit: Payer: Medicare Other | Admitting: "Endocrinology

## 2022-08-02 ENCOUNTER — Other Ambulatory Visit: Payer: Self-pay | Admitting: "Endocrinology

## 2022-08-02 DIAGNOSIS — E1165 Type 2 diabetes mellitus with hyperglycemia: Secondary | ICD-10-CM

## 2022-08-13 ENCOUNTER — Other Ambulatory Visit: Payer: Self-pay | Admitting: "Endocrinology

## 2022-08-13 DIAGNOSIS — E1165 Type 2 diabetes mellitus with hyperglycemia: Secondary | ICD-10-CM

## 2022-09-02 ENCOUNTER — Other Ambulatory Visit: Payer: Self-pay | Admitting: "Endocrinology

## 2022-09-02 DIAGNOSIS — E1165 Type 2 diabetes mellitus with hyperglycemia: Secondary | ICD-10-CM

## 2022-10-03 ENCOUNTER — Other Ambulatory Visit: Payer: Self-pay | Admitting: "Endocrinology

## 2022-10-03 DIAGNOSIS — E1165 Type 2 diabetes mellitus with hyperglycemia: Secondary | ICD-10-CM

## 2022-12-12 ENCOUNTER — Other Ambulatory Visit: Payer: Self-pay | Admitting: "Endocrinology

## 2022-12-12 DIAGNOSIS — E1165 Type 2 diabetes mellitus with hyperglycemia: Secondary | ICD-10-CM

## 2024-06-15 DEATH — deceased
